# Patient Record
Sex: Female | Born: 1960 | Race: White | Hispanic: No | Marital: Married | Smoking: Never smoker
Health system: Southern US, Community
[De-identification: ages and names within clinical notes are randomized; demographics above are authoritative.]

## PROBLEM LIST (undated history)

## (undated) DIAGNOSIS — K219 Gastro-esophageal reflux disease without esophagitis: Secondary | ICD-10-CM

## (undated) DIAGNOSIS — K209 Esophagitis, unspecified without bleeding: Secondary | ICD-10-CM

## (undated) DIAGNOSIS — F329 Major depressive disorder, single episode, unspecified: Secondary | ICD-10-CM

## (undated) DIAGNOSIS — M069 Rheumatoid arthritis, unspecified: Secondary | ICD-10-CM

## (undated) DIAGNOSIS — Z8489 Family history of other specified conditions: Secondary | ICD-10-CM

## (undated) DIAGNOSIS — D219 Benign neoplasm of connective and other soft tissue, unspecified: Secondary | ICD-10-CM

## (undated) DIAGNOSIS — E039 Hypothyroidism, unspecified: Secondary | ICD-10-CM

## (undated) DIAGNOSIS — R51 Headache: Secondary | ICD-10-CM

## (undated) DIAGNOSIS — R519 Headache, unspecified: Secondary | ICD-10-CM

## (undated) DIAGNOSIS — F32A Depression, unspecified: Secondary | ICD-10-CM

## (undated) HISTORY — DX: Esophagitis, unspecified without bleeding: K20.90

## (undated) HISTORY — DX: Benign neoplasm of connective and other soft tissue, unspecified: D21.9

## (undated) HISTORY — DX: Depression, unspecified: F32.A

## (undated) HISTORY — DX: Headache, unspecified: R51.9

## (undated) HISTORY — DX: Gastro-esophageal reflux disease without esophagitis: K21.9

## (undated) HISTORY — DX: Major depressive disorder, single episode, unspecified: F32.9

## (undated) HISTORY — PX: COLONOSCOPY: SHX174

## (undated) HISTORY — DX: Hypothyroidism, unspecified: E03.9

## (undated) HISTORY — PX: EYE SURGERY: SHX253

## (undated) HISTORY — PX: ESOPHAGOGASTRODUODENOSCOPY: SHX1529

## (undated) HISTORY — DX: Headache: R51

## (undated) HISTORY — DX: Esophagitis, unspecified: K20.9

## (undated) HISTORY — DX: Rheumatoid arthritis, unspecified: M06.9

## (undated) SURGERY — COLONOSCOPY
Anesthesia: Moderate Sedation

---

## 1998-09-29 ENCOUNTER — Ambulatory Visit (HOSPITAL_COMMUNITY): Admission: RE | Admit: 1998-09-29 | Discharge: 1998-09-29 | Payer: Self-pay | Admitting: Ophthalmology

## 1998-09-29 ENCOUNTER — Encounter: Payer: Self-pay | Admitting: Ophthalmology

## 1998-12-29 ENCOUNTER — Ambulatory Visit (HOSPITAL_COMMUNITY): Admission: RE | Admit: 1998-12-29 | Discharge: 1998-12-29 | Payer: Self-pay | Admitting: *Deleted

## 1999-01-26 ENCOUNTER — Ambulatory Visit (HOSPITAL_BASED_OUTPATIENT_CLINIC_OR_DEPARTMENT_OTHER): Admission: RE | Admit: 1999-01-26 | Discharge: 1999-01-26 | Payer: Self-pay | Admitting: Ophthalmology

## 2015-10-27 ENCOUNTER — Ambulatory Visit (INDEPENDENT_AMBULATORY_CARE_PROVIDER_SITE_OTHER): Payer: BC Managed Care – PPO | Admitting: Neurology

## 2015-10-27 ENCOUNTER — Encounter: Payer: Self-pay | Admitting: Neurology

## 2015-10-27 VITALS — BP 130/70 | HR 102 | Ht 62.0 in | Wt 139.0 lb

## 2015-10-27 DIAGNOSIS — G43709 Chronic migraine without aura, not intractable, without status migrainosus: Secondary | ICD-10-CM | POA: Diagnosis not present

## 2015-10-27 MED ORDER — NAPROXEN 500 MG PO TABS: 500.0000 mg | ORAL_TABLET | Freq: Two times a day (BID) | ORAL | Status: DC | PRN

## 2015-10-27 MED ORDER — SUMATRIPTAN SUCCINATE 100 MG PO TABS: ORAL_TABLET | ORAL | Status: DC

## 2015-10-27 MED ORDER — VENLAFAXINE HCL ER 37.5 MG PO TB24: ORAL_TABLET | ORAL | Status: DC

## 2015-10-27 NOTE — Progress Notes (Signed)
Note routed to both. 

## 2015-10-27 NOTE — Progress Notes (Signed)
NEUROLOGY CONSULTATION NOTE  [name] MRN: [id number] DOB: [birth year]  Referring provider: Erline Levine Primary care provider: Ephriam Jenkins  Reason for consult:  headache  HISTORY OF PRESENT ILLNESS: Laurie Cherry is a 54 year old right-handed female with rheumatoid arthritis, hypothyroidism, reflux, and mild depression who presents for headaches.  She has a history of "hormonal" migraines most of her life, described as pounding right temporal pain and often associated with her menstrual cycle.  They have pretty much stabilized but she will still get it every now and then.    She was diagnosed with rheumatoid arthritis in 2014.  She had been on methotrexate for about 2 years (stopped a month ago).  In September 2015, she began having new headaches.  It coincided with change in her thyroid medications.  They are left sided, 7/10 non-throbbing pain.   They are associated with photophobia, phonophobia, and osmophobia but not nausea.  They typically last 3 to 4 days and occur 1 to 2 times a week (however she also has a dull daily headache) Stress seems to be a trigger.  Change in thyroid medication may have been a trigger. These headaches do not correlate with her menstrual cycle.  Current abortive medication:  Sumatriptan 50mg  (helps).  Excedrin and tylenol (ineffective) She currently takes Celexa 10mg  daily, Armour Thyroid, and Vitamin D  Past preventative medication included propranolol ER 80mg  (it was effective but she stopped because it caused bradycardia and hypotension)  Caffeine:  1 cup of coffee daily Alcohol:  no Smoker:  no Diet:  1/2 can of soda daily.  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 Rheumatoid arthritis ([place])   . Depression   .  GERD (gastroesophageal reflux disease)   . Hypothyroidism   . Worsening headaches   . Esophagitis     PAST SURGICAL HISTORY: Past Surgical History  Procedure Laterality Date  . Eye surgery Right     MEDICATIONS: No current outpatient prescriptions on file prior to visit.   No current facility-administered medications on file prior to visit.    ALLERGIES: Allergies  Allergen Reactions  . Arava [Leflunomide] Hives  . Azithromycin Other (See Comments)    Severe bloating, abd pain Severe bloating, abd pain  . Cefuroxime Other (See Comments)    Increased bloating, abd pain  . Cefuroxime Axetil Other (See Comments)    Increased bloating, abd pain  . Chamomile   . Clarithromycin Other (See Comments)    abd pain abd pain  . Etanercept Other (See Comments) and Hives    Injection site irritation Injection site irritation  . Levofloxacin Other (See Comments) and Hives    Severe dizziness Severe dizziness  . Methotrexate Derivatives   . Sulfa Antibiotics Hives    abd pain    FAMILY HISTORY: Family History  Problem Relation Age of Onset  . Thyroid disease Mother   . Heart attack Mother   . Stroke Mother   . Diabetes Mother   . Brain cancer Mother   . Colon cancer Father   . Heart attack Sister   . Hypertension Sister   . Hypertension Brother     SOCIAL HISTORY: Social History   Social History  . Marital Status: Married    Spouse Name: N/A  . Number of Children:  N/A  . Years of Education: N/A   Occupational History  . Not on file.   Social History Main Topics  . Smoking status: Never Smoker   . Smokeless tobacco: Not on file  . Alcohol Use: No  . Drug Use: No  . Sexual Activity: Not on file   Other Topics Concern  . Not on file   Social History Narrative  . No narrative on file    REVIEW OF SYSTEMS: Constitutional: No fevers, chills, or sweats, no generalized fatigue, change in appetite Eyes: No visual changes, double vision, eye pain Ear, nose  and throat: No hearing loss, ear pain, nasal congestion, sore throat Cardiovascular: No chest pain, palpitations Respiratory:  No shortness of breath at rest or with exertion, wheezes GastrointestinaI: No nausea, vomiting, diarrhea, abdominal pain, fecal incontinence Genitourinary:  No dysuria, urinary retention or frequency Musculoskeletal:  No neck pain, back pain Integumentary: No rash, pruritus, skin lesions Neurological: as above Psychiatric: No depression, insomnia, anxiety Endocrine: No palpitations, fatigue, diaphoresis, mood swings, change in appetite, change in weight, increased thirst Hematologic/Lymphatic:  No anemia, purpura, petechiae. Allergic/Immunologic: no itchy/runny eyes, nasal congestion, recent allergic reactions, rashes  PHYSICAL EXAM: Filed Vitals:   10/27/15 0855  BP: 130/70  Pulse: 102   General: No acute distress.  Patient appears well-groomed.   Head:  Normocephalic/atraumatic Eyes:  fundi unremarkable, without vessel changes, exudates, hemorrhages or papilledema. Neck: supple, no paraspinal tenderness, full range of motion Back: No paraspinal tenderness Heart: regular rate and rhythm Lungs: Clear to auscultation bilaterally. Vascular: No carotid bruits. Neurological Exam: Mental status: alert and oriented to person, place, and time, recent and remote memory intact, fund of knowledge intact, attention and concentration intact, speech fluent and not dysarthric, language intact. Cranial nerves: CN I: not tested CN II: pupils equal, round and reactive to light, visual fields intact, fundi unremarkable, without vessel changes, exudates, hemorrhages or papilledema. CN III, IV, VI:  full range of motion, no nystagmus, no ptosis CN V: facial sensation intact CN VII: upper and lower face symmetric CN VIII: hearing intact CN IX, X: gag intact, uvula midline CN XI: sternocleidomastoid and trapezius muscles intact CN XII: tongue midline Bulk & Tone: normal, no  fasciculations. Motor:  5/5 throughout  Sensation: temperature and vibration sensation intact. Deep Tendon Reflexes:  2+ throughout, toes downgoing.  Finger to nose testing:  Without dysmetria.  Heel to shin:  Without dysmetria.  Gait:  Normal station and stride.  Able to turn and tandem walk. Romberg negative.  IMPRESSION: Chronic migraine.  Neurologic exam normal.  PLAN: 1.  Will switch from Celexa to venlafaxine ER, titrating to 75mg  daily (this may address headaches better than Celexa and still treat depression) 2.  For abortive therapy, will try sumatriptan 100mg  with/without naproxen 500mg  3.  Lifestyle modification 4.  She is to call in 4 weeks with update.  Follow up in 3 months.  45 minutes spent face to face with patient, over 50% spent discussing diagnosis and management.  Thank you for allowing me to take part in the care of this patient.  Metta Clines, DO  CC:  Dianne Weston Brass

## 2015-10-27 NOTE — Patient Instructions (Signed)
Migraine Recommendations: 1.  Stop Celexa.  Start venlafaxine ER 37.5mg .  Take 1 pill daily for 7 days, then increase to 2 pills daily.  Call in 4 weeks with update and we can adjust dose if needed. 2.  Take sumatriptan 100mg  at earliest onset of headache.  May repeat dose once in 2 hours if needed.  Do not exceed two doses in 24 hours.  If sumatriptan alone is ineffective, then next time take each dose of sumatriptan with naproxen 500mg  3.  Limit use of pain relievers to no more than 2 days out of the week.  These medications include acetaminophen, ibuprofen, triptans and narcotics.  This will help reduce risk of rebound headaches. 4.  Be aware of common food triggers such as processed sweets, processed foods with nitrites (such as deli meat, hot dogs, sausages), foods with MSG, alcohol (such as wine), chocolate, certain cheeses, certain fruits (dried fruits, some citrus fruit), vinegar, diet soda. 4.  Avoid caffeine 5.  Routine exercise 6.  Proper sleep hygiene 7.  Stay adequately hydrated with water 8.  Keep a headache diary. 9.  Maintain proper stress management. 10.  Do not skip meals. 11.  Consider supplements:  Magnesium oxide 400mg  to 600mg  daily, riboflavin 400mg , Coenzyme Q 10 100mg  three times daily 12.  Follow up in 3 months but CALL IN 4 WEEKS WITH UPDATE

## 2015-11-30 ENCOUNTER — Telehealth: Payer: Self-pay

## 2015-11-30 NOTE — Telephone Encounter (Signed)
She should discontinue venlafaxine and see if symptoms improve.  Instead, we can start topiramate 25mg  at bedtime.  She can pick this up but I would like her to wait until her symptoms resolve before starting it.  She should be aware that topiramate may cause numbness and tingling or problems with concentration, but these are dose-related side effects that often resolve once her body gets use to the dose.  In very rare cases, it may cause kidney stones, so she should stay hydrated with water.  She should call us in 4 weeks after starting the medication.

## 2015-11-30 NOTE — Telephone Encounter (Signed)
Patient called with a report on her medication. Pt started taking the venlafaxine ER ~ 11/04/15. She can tell no difference really in her headache frequency yet. She is also experiencing some side effects including tachycardia (108, 110,118), shaking/hungry sensation that began ~ 4-5 days ago. Please advise.

## 2015-12-01 NOTE — Telephone Encounter (Signed)
Attempted to reach pt. Home number rang and rang, no answering machine picked up.

## 2015-12-01 NOTE — Telephone Encounter (Signed)
Attempted to reach again. Again phone just continuously rang.

## 2015-12-27 ENCOUNTER — Telehealth: Payer: Self-pay | Admitting: Neurology

## 2015-12-27 MED ORDER — TOPIRAMATE 25 MG PO TABS: 25.0000 mg | ORAL_TABLET | Freq: Every day | ORAL | Status: DC

## 2015-12-27 NOTE — Telephone Encounter (Signed)
We can increase venlafaxine ER to 150mg  daily. Certain types of tinted lenses are helpful. I recommend that she see an ophthalmologist for such treatment options and for a formal evaluation of the eyes

## 2015-12-27 NOTE — Telephone Encounter (Signed)
VM-PT called and said her headaches were more frequent and she is feeling worse/Dawn CB# [phone]

## 2015-12-27 NOTE — Telephone Encounter (Signed)
Pt called about her left eye sensitivity. States that even when there isn't  any pain, the sensitivity to light is much worse. Cannot hardly stand any light in that eye, constantly.

## 2015-12-27 NOTE — Telephone Encounter (Signed)
Left message on machine for pt to return call to the office.  

## 2015-12-28 MED ORDER — VENLAFAXINE HCL ER 150 MG PO CP24: 150.0000 mg | ORAL_CAPSULE | Freq: Every day | ORAL | Status: DC

## 2015-12-28 NOTE — Telephone Encounter (Signed)
Message left w/ infomration on voicemail. RX sent in.

## 2015-12-28 NOTE — Telephone Encounter (Signed)
Spoke with patient. She had just discontinued the Effexor in exchange for the topamax as recommended last month. Pt finished out RX before picking up new. Do you think the Effexor would be better suited vs Topamax? PT has only had 1 dose of Topamax at this point. Please advise.

## 2015-12-28 NOTE — Telephone Encounter (Signed)
No, I would continue topamax, but would increase dose to 50mg  at bedtime.  I would give this dose another 4 weeks and have her contact us to determine if further changes need to be made.

## 2015-12-28 NOTE — Addendum Note (Signed)
Addended by: Gerda Diss A on: 12/28/2015 10:38 AM   Modules accepted: Orders, Medications

## 2016-02-06 ENCOUNTER — Ambulatory Visit (INDEPENDENT_AMBULATORY_CARE_PROVIDER_SITE_OTHER): Payer: BC Managed Care – PPO | Admitting: Neurology

## 2016-02-06 ENCOUNTER — Encounter: Payer: Self-pay | Admitting: Neurology

## 2016-02-06 VITALS — BP 150/72 | HR 70 | Ht 62.0 in | Wt 136.8 lb

## 2016-02-06 DIAGNOSIS — G43709 Chronic migraine without aura, not intractable, without status migrainosus: Secondary | ICD-10-CM | POA: Diagnosis not present

## 2016-02-06 DIAGNOSIS — F329 Major depressive disorder, single episode, unspecified: Secondary | ICD-10-CM

## 2016-02-06 DIAGNOSIS — R030 Elevated blood-pressure reading, without diagnosis of hypertension: Secondary | ICD-10-CM | POA: Diagnosis not present

## 2016-02-06 DIAGNOSIS — IMO0001 Reserved for inherently not codable concepts without codable children: Secondary | ICD-10-CM

## 2016-02-06 DIAGNOSIS — F32A Depression, unspecified: Secondary | ICD-10-CM

## 2016-02-06 MED ORDER — ATENOLOL 25 MG PO TABS: ORAL_TABLET | ORAL | Status: DC

## 2016-02-06 NOTE — Progress Notes (Signed)
NEUROLOGY FOLLOW UP OFFICE NOTE  JOMANDA BOWLEY [id number]  HISTORY OF PRESENT ILLNESS: Laurie Cherry is a 55 year old right-handed female with rheumatoid arthritis, hypothyroidism, reflux, and mild depression who follows up for chronic migraine.  UPDATE: Not too much improvement, if at all.  She was migraine-free for about 2 weeks up until 5 days ago, in which she has had a daily migraine. Intensity:  7-8/10 Duration:  Starts improving within 20 to 30 minutes with sumatriptan 50mg  w/wo naproxen Frequency:  15 headache days per month Current NSAIDS:  none Current analgesics:  none Current triptans:  sumatriptan 50mg  (100mg  caused drowsiness) (w/wo naproxen 500mg ) Current anti-emetic:  none Current muscle relaxants:  none Current anti-anxiolytic:  none Current sleep aide:  none Current Antihypertensive medications:  none Current Antidepressant medications:  venlafaxine ER 75mg  Current Anticonvulsant medications:  none  HISTORY: She has a history of "hormonal" migraines most of her life, described as pounding right temporal pain and often associated with her menstrual cycle.  They have pretty much stabilized but she will still get it every now and then.    She was diagnosed with rheumatoid arthritis in 2014.  She had been on methotrexate for about 2 years (stopped a month ago).  In September 2015, she began having new headaches.  It coincided with change in her thyroid medications.  They are left sided, 7/10 non-throbbing pain.    They are associated with photophobia, phonophobia, and osmophobia but not nausea.   They typically last 3 to 4 days and occur 1 to 2 times a week (however she also has a dull daily headache) Stress seems to be a trigger.  Change in thyroid medication may have been a trigger. These headaches do not correlate with her menstrual cycle.  Current abortive medication:  Sumatriptan 50mg  (helps).  Excedrin and tylenol (ineffective) She currently takes Celexa  10mg  daily, Armour Thyroid, and Vitamin D  Past preventative medication included propranolol ER 80mg  (it was effective but she stopped because it caused bradycardia and hypotension), topiramate 25mg  (caused drowsiness, but willing to retry it if needed)  Caffeine:  1 cup of coffee daily Alcohol:  no Smoker:  no Diet:  1/2 can of soda daily.  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 Rheumatoid arthritis ([place])   . Depression   . GERD (gastroesophageal reflux disease)   . Hypothyroidism   . Worsening headaches   . Esophagitis     MEDICATIONS: Current Outpatient Prescriptions on File Prior to Visit  Medication Sig Dispense Refill  . ARMOUR THYROID 60 MG tablet Take 60 mg by mouth daily before breakfast.   11  . naproxen (NAPROSYN) 500 MG tablet Take 1 tablet (500 mg total) by mouth every 12 (twelve) hours as needed. 10 tablet 2  . SUMAtriptan (IMITREX) 100 MG tablet Take 1 tab at earliest onset of headache.  May repeat once in 2 hours if headache persists or recurs.  Do not exceed 2 tablets in 24 hours 10 tablet 2  . venlafaxine XR (EFFEXOR-XR) 150 MG 24 hr capsule Take 1 capsule (150 mg total) by mouth daily. 30 capsule 1  . VENTOLIN HFA 108 (90 Base) MCG/ACT inhaler INHALE 2 PUFFS PO Q 4 H PRN  0  . Vitamin D, Ergocalciferol, (DRISDOL) 50000 units  CAPS capsule Take 50,000 Units by mouth every 7 (seven) days.      No current facility-administered medications on file prior to visit.    ALLERGIES: Allergies  Allergen Reactions  . Arava [Leflunomide] Hives  . Azithromycin Other (See Comments)    Severe bloating, abd pain Severe bloating, abd pain  . Cefuroxime Other (See Comments)    Increased bloating, abd pain  . Cefuroxime Axetil Other (See Comments)     Increased bloating, abd pain  . Chamomile   . Clarithromycin Other (See Comments)    abd pain abd pain  . Etanercept Other (See Comments) and Hives    Injection site irritation Injection site irritation  . Levofloxacin Other (See Comments) and Hives    Severe dizziness Severe dizziness  . Methotrexate Derivatives   . Sulfa Antibiotics Hives    abd pain    FAMILY HISTORY: Family History  Problem Relation Age of Onset  . Thyroid disease Mother   . Heart attack Mother   . Stroke Mother   . Diabetes Mother   . Brain cancer Mother   . Colon cancer Father   . Heart attack Sister   . Hypertension Sister   . Hypertension Brother     SOCIAL HISTORY: Social History   Social History  . Marital Status: Married    Spouse Name: N/A  . Number of Children: N/A  . Years of Education: N/A   Occupational History  . Not on file.   Social History Main Topics  . Smoking status: Never Smoker   . Smokeless tobacco: Not on file  . Alcohol Use: No  . Drug Use: No  . Sexual Activity: Not on file   Other Topics Concern  . Not on file   Social History Narrative    REVIEW OF SYSTEMS: Constitutional: No fevers, chills, or sweats, no generalized fatigue, change in appetite Eyes: No visual changes, double vision, eye pain Ear, nose and throat: No hearing loss, ear pain, nasal congestion, sore throat Cardiovascular: No chest pain, palpitations Respiratory:  No shortness of breath at rest or with exertion, wheezes GastrointestinaI: No nausea, vomiting, diarrhea, abdominal pain, fecal incontinence Genitourinary:  No dysuria, urinary retention or frequency Musculoskeletal:  No neck pain, back pain Integumentary: No rash, pruritus, skin lesions Neurological: as above Psychiatric: No depression, insomnia, anxiety Endocrine: No palpitations, fatigue, diaphoresis, mood swings, change in appetite, change in weight, increased thirst Hematologic/Lymphatic:  No anemia, purpura,  petechiae. Allergic/Immunologic: no itchy/runny eyes, nasal congestion, recent allergic reactions, rashes  PHYSICAL EXAM: Filed Vitals:   02/06/16 1323  BP: 150/72  Pulse: 70   General: No acute distress.  Patient appears well-groomed.  normal body habitus. Head:  Normocephalic/atraumatic Eyes:  Fundoscopic exam unremarkable without vessel changes, exudates, hemorrhages or papilledema. Neck: supple, no paraspinal tenderness, full range of motion Heart:  Regular rate and rhythm Lungs:  Clear to auscultation bilaterally Back: No paraspinal tenderness Neurological Exam: alert and oriented to person, place, and time. Attention span and concentration intact, recent and remote memory intact, fund of knowledge intact.  Speech fluent and not dysarthric, language intact.  CN II-XII intact. Fundoscopic exam unremarkable without vessel changes, exudates, hemorrhages or papilledema.  Bulk and tone normal, muscle strength 5/5 throughout.  Sensation to light touch, temperature and vibration intact.  Deep tendon reflexes 2+ throughout, toes downgoing.  Finger to nose and heel to shin testing intact.  Gait normal, Romberg negative.  IMPRESSION: Chronic migraine Elevated blood pressure depression  PLAN: 1.  Will  start atenolol 25mg  daily for 7 days, then increase to 50mg  daily.  She previously experienced bradycardia and hypotension on propranolol, but atenolol is often better tolerated and her blood pressure has been elevated lately.  Cautioned to monitor for lightheadedness. 2.  Continue sumatriptan with/without naproxen 500mg  3.  Will continue venlafaxine ER 150mg  daily for depression  4.  Follow up with PCP regarding elevated blood pressure 5.  She will contact us in 5 weeks with update.  Follow up in 3 months.  15 minutes spent face to face with patient, over 50% spent discussing management.  Metta Clines, DO  CC:  Ephriam Jenkins

## 2016-02-06 NOTE — Progress Notes (Signed)
Chart forwarded.  

## 2016-02-06 NOTE — Patient Instructions (Addendum)
1.  Start atenolol 25mg  daily for 7 days, then increase to 50mg  daily.  Monitor for lightheadedness.  Contact us in 5 weeks with update. 2.  Continue sumatriptan 100mg  and naproxen as directed 3.  Continue venlafaxine ER 150mg  daily 4.  Your blood pressure is elevated today.  We are starting a blood pressure medication anyway, but follow up with your PCP regarding this. 5.  Follow up in 3 months.

## 2016-02-16 ENCOUNTER — Other Ambulatory Visit: Payer: Self-pay | Admitting: Neurology

## 2016-03-05 ENCOUNTER — Other Ambulatory Visit: Payer: Self-pay | Admitting: Neurology

## 2016-03-06 NOTE — Telephone Encounter (Signed)
Last OV: 02/06/16 Next OV: 05/14/16   Will start atenolol 25mg  daily for 7 days, then increase to 50mg  daily. She previously experienced bradycardia and hypotension on propranolol, but atenolol is often better tolerated and her blood pressure has been elevated lately. Cautioned to monitor for lightheadedness.

## 2016-03-29 ENCOUNTER — Ambulatory Visit (INDEPENDENT_AMBULATORY_CARE_PROVIDER_SITE_OTHER): Payer: BC Managed Care – PPO | Admitting: "Endocrinology

## 2016-03-29 ENCOUNTER — Encounter: Payer: Self-pay | Admitting: "Endocrinology

## 2016-03-29 VITALS — BP 135/86 | HR 69 | Ht 62.0 in | Wt 133.0 lb

## 2016-03-29 DIAGNOSIS — E038 Other specified hypothyroidism: Secondary | ICD-10-CM | POA: Diagnosis not present

## 2016-03-29 DIAGNOSIS — E039 Hypothyroidism, unspecified: Secondary | ICD-10-CM | POA: Insufficient documentation

## 2016-03-29 MED ORDER — LEVOTHYROXINE SODIUM 88 MCG PO TABS: 88.0000 ug | ORAL_TABLET | Freq: Every day | ORAL | Status: DC

## 2016-03-29 NOTE — Progress Notes (Signed)
Subjective:    Patient ID: Laurie Cherry, female    DOB: Jun 17, 1961, PCP Thea Alken   Past Medical History  Diagnosis Date  . Rheumatoid arthritis ([place])   . Depression   . GERD (gastroesophageal reflux disease)   . Hypothyroidism   . Worsening headaches   . Esophagitis    Past Surgical History  Procedure Laterality Date  . Eye surgery Right    Social History   Social History  . Marital Status: Married    Spouse Name: N/A  . Number of Children: N/A  . Years of Education: N/A   Social History Main Topics  . Smoking status: Never Smoker   . Smokeless tobacco: None  . Alcohol Use: No  . Drug Use: No  . Sexual Activity: Not Asked   Other Topics Concern  . None   Social History Narrative   Outpatient Encounter Prescriptions as of 03/29/2016  Medication Sig  . Adalimumab (HUMIRA PEN) 40 MG/0.8ML PNKT Inject into the skin.  . citalopram (CELEXA) 20 MG tablet Take 20 mg by mouth daily.  . methotrexate (RHEUMATREX) 2.5 MG tablet Take 2.5 mg by mouth as directed. Caution:Chemotherapy. Protect from light.  Marland Kitchen atenolol (TENORMIN) 50 MG tablet Take 1 tablet (50 mg total) by mouth daily. (Patient taking differently: Take 25 mg by mouth daily. )  . levothyroxine (SYNTHROID) 88 MCG tablet Take 1 tablet (88 mcg total) by mouth daily before breakfast.  . naproxen (NAPROSYN) 500 MG tablet Take 1 tablet (500 mg total) by mouth every 12 (twelve) hours as needed.  . SUMAtriptan (IMITREX) 100 MG tablet SEE NOTES  . VENTOLIN HFA 108 (90 Base) MCG/ACT inhaler INHALE 2 PUFFS PO Q 4 H PRN  . Vitamin D, Ergocalciferol, (DRISDOL) 50000 units CAPS capsule Take 50,000 Units by mouth every 7 (seven) days.   . [DISCONTINUED] ARMOUR THYROID 60 MG tablet Take 60 mg by mouth daily before breakfast.   . [DISCONTINUED] levothyroxine (SYNTHROID) 88 MCG tablet Take 1 tablet (88 mcg total) by mouth daily before breakfast.  . [DISCONTINUED] venlafaxine XR (EFFEXOR-XR) 150 MG 24 hr capsule Take 1  capsule (150 mg total) by mouth daily.   No facility-administered encounter medications on file as of 03/29/2016.   ALLERGIES: Allergies  Allergen Reactions  . Arava [Leflunomide] Hives  . Azithromycin Other (See Comments)    Severe bloating, abd pain Severe bloating, abd pain  . Cefuroxime Other (See Comments)    Increased bloating, abd pain  . Cefuroxime Axetil Other (See Comments)    Increased bloating, abd pain  . Chamomile   . Clarithromycin Other (See Comments)    abd pain abd pain  . Etanercept Other (See Comments) and Hives    Injection site irritation Injection site irritation  . Levofloxacin Other (See Comments) and Hives    Severe dizziness Severe dizziness  . Methotrexate Derivatives   . Sulfa Antibiotics Hives    abd pain   VACCINATION STATUS:  There is no immunization history on file for this patient.  HPI  55 year old female patient with medical history as above. She is being seen in consultation for long-standing hypothyroidism requested by her primary care provider, Thea Alken FNP. She states that she was diagnosed with hypothyroidism approximately at age 89. She is currently on Armour Thyroid 60 mg daily. Her thyroid function test have never been regulated. Her recent set of labs show suppressed TSH but appropriate free T4 and free T3. -She is compliant with her medications.  She has family  history of thyroid dysfunction in multiple members of her family.  She denies any history of goiter. She denies any family history of thyroid cancer. -Currently denies any heat/cold intolerance.  -She is on multiple medications for her rheumatoid arthritis. In recent months she has steady body weight.  Review of Systems  Constitutional: no weight gain/loss, + fatigue, no subjective hyperthermia/hypothermia Eyes: no blurry vision, no xerophthalmia ENT: no sore throat, no nodules palpated in throat, no dysphagia/odynophagia, no hoarseness Cardiovascular: no  CP/SOB/palpitations/leg swelling Respiratory: no cough/SOB Gastrointestinal: no N/V/D/C Musculoskeletal: no muscle/joint aches Skin: no rashes Neurological: no tremors/numbness/tingling/dizziness Psychiatric: no depression/anxiety   Objective:    BP 135/86 mmHg  Pulse 69  Ht 5\' 2"  (1.575 m)  Wt 133 lb (60.328 kg)  BMI 24.32 kg/m2  SpO2 97%  Wt Readings from Last 3 Encounters:  03/29/16 133 lb (60.328 kg)  02/06/16 136 lb 12.8 oz (62.052 kg)  10/27/15 139 lb (63.05 kg)    Physical Exam  Constitutional:  in NAD Eyes: PERRLA, EOMI, no exophthalmos ENT: moist mucous membranes, no thyromegaly, no cervical lymphadenopathy Cardiovascular: RRR, No MRG Respiratory: CTA B Gastrointestinal: abdomen soft, NT, ND, BS+ Musculoskeletal: no deformities, strength intact in all 4 Skin: moist, warm, no rashes Neurological: no tremor with outstretched hands, DTR normal in all 4  On 02/21/2016: Free T4 2 0.9, TSH 0.13, free T4 0.86  Assessment & Plan:   1.  hypothyroidism -I have reviewed her available thyroid records and clinically evaluated this patient. Based on her history, it is likely that she has Hashimoto's thyroiditis as a cause of hypothyroidism. -She is clinically stable despite her recent abnormal thyroid function tests. This is mainly driven by the desiccated thyroid (Armour) she is taking. -She is open to consider Synthroid which would give her a chance to regulate thyroid function test better. If she uses Synthroid at the right dose, she should expect appropriate clinical response. I will initiate Synthroid 88 g by mouth every morning. I advised her to stop Armour Thyroid.  - We discussed about correct intake of levothyroxine, at fasting, with water, separated by at least 30 minutes from breakfast, and separated by more than 4 hours from calcium, iron, multivitamins, acid reflux medications (PPIs). -Patient is made aware of the fact that thyroid hormone replacement is needed  for life, dose to be adjusted by periodic monitoring of thyroid function tests. -Her next thyroid function tests within 2 weeks with office visit in 3 weeks. -She has no clinical goiter, hence, no need for thyroid/neck ultrasound.   - I advised patient to maintain close follow up with Ephriam Jenkins E for primary care needs. Follow up plan: Return in about 3 weeks (around 04/19/2016) for follow up with pre-visit labs, underactive thyroid.  Glade Lloyd, MD Phone: [phone]  Fax: [phone]   03/29/2016, 3:39 PM

## 2016-04-12 ENCOUNTER — Telehealth: Payer: Self-pay

## 2016-04-12 NOTE — Telephone Encounter (Signed)
Pt states that the Synthroid she is taking is causing "bloating and stuffy nose". She is wanting to change this medication.

## 2016-04-15 NOTE — Telephone Encounter (Signed)
Wondering if she has done the follow up labs ? This will be helpful how to dose her next replacement.

## 2016-04-15 NOTE — Telephone Encounter (Signed)
Called pt. No answer. No voice mail. Pt needs to have bloodwork done if she has not already.

## 2016-04-17 NOTE — Telephone Encounter (Signed)
Called pt. No Answer. No Voice mail. Awaiting return call from pt.

## 2016-04-22 ENCOUNTER — Ambulatory Visit: Payer: BC Managed Care – PPO | Admitting: "Endocrinology

## 2016-04-22 ENCOUNTER — Telehealth: Payer: Self-pay

## 2016-04-22 NOTE — Telephone Encounter (Signed)
Pt states that she has stopped the synthroid and started back on her Armour thyroid. Dr Dorris Fetch states for her to stay on the [place] for 3 weeks and then have labs at 4 weeks.  Pt notified.

## 2016-04-29 ENCOUNTER — Ambulatory Visit: Payer: BC Managed Care – PPO | Admitting: "Endocrinology

## 2016-05-09 ENCOUNTER — Ambulatory Visit: Payer: BC Managed Care – PPO | Admitting: Neurology

## 2016-05-09 ENCOUNTER — Other Ambulatory Visit: Payer: Self-pay

## 2016-05-13 ENCOUNTER — Encounter: Payer: Self-pay | Admitting: Neurology

## 2016-05-13 ENCOUNTER — Ambulatory Visit (INDEPENDENT_AMBULATORY_CARE_PROVIDER_SITE_OTHER): Payer: BC Managed Care – PPO | Admitting: Neurology

## 2016-05-13 VITALS — BP 134/80 | HR 83 | Ht 62.0 in | Wt 137.0 lb

## 2016-05-13 DIAGNOSIS — G43709 Chronic migraine without aura, not intractable, without status migrainosus: Secondary | ICD-10-CM | POA: Diagnosis not present

## 2016-05-13 NOTE — Patient Instructions (Signed)
Migraine Recommendations: 1.  Continue atenolol 25mg  daily 2.  Take sumatriptan at earliest onset of headache.  May repeat dose once in 2 hours if needed.  Do not exceed two tablets in 24 hours. 3.  Limit use of pain relievers to no more than 2 days out of the week.  These medications include acetaminophen, ibuprofen, triptans and narcotics.  This will help reduce risk of rebound headaches. 4.  Be aware of common food triggers such as processed sweets, processed foods with nitrites (such as deli meat, hot dogs, sausages), foods with MSG, alcohol (such as wine), chocolate, certain cheeses, certain fruits (dried fruits, some citrus fruit), vinegar, diet soda. 4.  Avoid caffeine 5.  Routine exercise 6.  Proper sleep hygiene 7.  Stay adequately hydrated with water 8.  Keep a headache diary. 9.  Maintain proper stress management. 10.  Do not skip meals. 11.  Consider supplements:  Magnesium oxide 400mg  to 600mg  daily, riboflavin 400mg , Coenzyme Q 10 100mg  three times daily 12.  Follow up in 3 months.

## 2016-05-13 NOTE — Progress Notes (Signed)
NEUROLOGY FOLLOW UP OFFICE NOTE  ELISSA LEFEVERS [id number]  HISTORY OF PRESENT ILLNESS: Brenasia Mickle is a 55 year old right-handed female with rheumatoid arthritis, hypothyroidism, reflux, and mild depression who follows up for chronic migraine.  UPDATE: Improved.  She has retired and under less stress.  She still does not exercise or drink enough water.  Intensity is improved and frequency mildly reduced. Intensity:  5-6/10 Duration:  Starts improving within 20 to 30 minutes with sumatriptan 50mg  w/wo naproxen Frequency:  15 headache days per month Current NSAIDS:  none Current analgesics:  none Current triptans:  sumatriptan 50mg  (100mg  caused drowsiness) (w/wo naproxen 500mg ) Current anti-emetic:  none Current muscle relaxants:  none Current anti-anxiolytic:  none Current sleep aide:  none Current Antihypertensive medications:  atenolol 25mg  (50mg  caused dizziness and hypotension/bradycardia) Current Antidepressant medications:  Celexa Current Anticonvulsant medications:  none  HISTORY: She has a history of "hormonal" migraines most of her life, described as pounding right temporal pain and often associated with her menstrual cycle.  They have pretty much stabilized but she will still get it every now and then.    She was diagnosed with rheumatoid arthritis in 2014.  She had been on methotrexate for about 2 years (stopped a month ago).  In September 2015, she began having new headaches.  It coincided with change in her thyroid medications.  They are left sided, 7/10 non-throbbing pain.    They are associated with photophobia, phonophobia, and osmophobia but not nausea.   They typically last 3 to 4 days and occur 1 to 2 times a week (however she also has a dull daily headache) Stress seems to be a trigger.  Change in thyroid medication may have been a trigger. These headaches do not correlate with her menstrual cycle.  Past abortive medication: Excedrin and tylenol  (ineffective)  Past preventative medication included propranolol ER 80mg  (it was effective but she stopped because it caused bradycardia and hypotension), topiramate 25mg  (caused drowsiness, but willing to retry it if needed), venlafaxine XR 150mg   Caffeine:  1 cup of coffee daily Alcohol:  no Smoker:  no Diet:  1/2 can of soda daily.  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 Rheumatoid arthritis ([place])   . Depression   . GERD (gastroesophageal reflux disease)   . Hypothyroidism   . Worsening headaches   . Esophagitis     MEDICATIONS: Current Outpatient Prescriptions on File Prior to Visit  Medication Sig Dispense Refill  . Adalimumab (HUMIRA PEN) 40 MG/0.8ML PNKT Inject into the skin.    Francia Greaves THYROID 60 MG tablet   11  . atenolol (TENORMIN) 50 MG tablet Take 1 tablet (50 mg total) by mouth daily. (Patient taking differently: Take 25 mg by mouth daily. ) 30 tablet 2  . citalopram (CELEXA) 20 MG tablet Take 20 mg by mouth daily.    . folic acid (FOLVITE) 1 MG tablet TK 1 T PO QD  2  . methotrexate (RHEUMATREX) 2.5 MG tablet Take 2.5 mg by mouth as directed. Caution:Chemotherapy. Protect from light.    . naproxen (NAPROSYN) 500 MG tablet Take 1 tablet (500 mg total) by mouth every 12 (twelve) hours as needed. 10 tablet 2  . SUMAtriptan (IMITREX) 100 MG tablet SEE NOTES 10 tablet 4  .  VENTOLIN HFA 108 (90 Base) MCG/ACT inhaler INHALE 2 PUFFS PO Q 4 H PRN  0  . Vitamin D, Ergocalciferol, (DRISDOL) 50000 units CAPS capsule Take 50,000 Units by mouth every 7 (seven) days.      No current facility-administered medications on file prior to visit.    ALLERGIES: Allergies  Allergen Reactions  . Arava [Leflunomide] Hives  . Azithromycin Other (See Comments)    Severe  bloating, abd pain Severe bloating, abd pain  . Cefuroxime Other (See Comments)    Increased bloating, abd pain  . Cefuroxime Axetil Other (See Comments)    Increased bloating, abd pain  . Chamomile   . Clarithromycin Other (See Comments)    abd pain abd pain  . Etanercept Other (See Comments) and Hives    Injection site irritation Injection site irritation  . Levofloxacin Other (See Comments) and Hives    Severe dizziness Severe dizziness  . Methotrexate Derivatives   . Sulfa Antibiotics Hives    abd pain    FAMILY HISTORY: Family History  Problem Relation Age of Onset  . Thyroid disease Mother   . Heart attack Mother   . Stroke Mother   . Diabetes Mother   . Brain cancer Mother   . Colon cancer Father   . Heart attack Sister   . Hypertension Sister   . Hypertension Brother     SOCIAL HISTORY: Social History   Social History  . Marital Status: Married    Spouse Name: N/A  . Number of Children: N/A  . Years of Education: N/A   Occupational History  . Not on file.   Social History Main Topics  . Smoking status: Never Smoker   . Smokeless tobacco: Not on file  . Alcohol Use: No  . Drug Use: No  . Sexual Activity: Not on file   Other Topics Concern  . Not on file   Social History Narrative    REVIEW OF SYSTEMS: Constitutional: No fevers, chills, or sweats, no generalized fatigue, change in appetite Eyes: No visual changes, double vision, eye pain Ear, nose and throat: No hearing loss, ear pain, nasal congestion, sore throat Cardiovascular: No chest pain, palpitations Respiratory:  No shortness of breath at rest or with exertion, wheezes GastrointestinaI: No nausea, vomiting, diarrhea, abdominal pain, fecal incontinence Genitourinary:  No dysuria, urinary retention or frequency Musculoskeletal:  No neck pain, back pain Integumentary: No rash, pruritus, skin lesions Neurological: as above Psychiatric: No depression, insomnia, anxiety Endocrine: No  palpitations, fatigue, diaphoresis, mood swings, change in appetite, change in weight, increased thirst Hematologic/Lymphatic:  No purpura, petechiae. Allergic/Immunologic: no itchy/runny eyes, nasal congestion, recent allergic reactions, rashes  PHYSICAL EXAM: Filed Vitals:   05/13/16 0922  BP: 134/80  Pulse: 83   General: No acute distress.  Patient appears well-groomed.  normal body habitus. Head:  Normocephalic/atraumatic Eyes:  Fundi examined but not visualized Neck: supple, no paraspinal tenderness, full range of motion Heart:  Regular rate and rhythm Lungs:  Clear to auscultation bilaterally Back: No paraspinal tenderness Neurological Exam: alert and oriented to person, place, and time. Attention span and concentration intact, recent and remote memory intact, fund of knowledge intact.  Speech fluent and not dysarthric, language intact.  CN II-XII intact. Bulk and tone normal, muscle strength 5/5 throughout.  Sensation to light touch, temperature and vibration intact.  Deep tendon reflexes 2+ throughout, toes downgoing.  Finger to nose and heel to shin testing intact.  Gait normal, Romberg negative.  IMPRESSION: Chronic migraine  PLAN: 1.  Continue atenolol 25mg  daily and sumatriptan as needed. 2.  Will work on lifestyle modification as discussed. 3.  Follow up in 3 months.  If frequency not improved, consider Botox  15 minutes spent face to face with patient, over 50% spent discussing management.  Metta Clines, DO  CC:  Ephriam Jenkins

## 2016-05-13 NOTE — Progress Notes (Signed)
Chart forwarded to PCP.  

## 2016-05-21 ENCOUNTER — Ambulatory Visit (INDEPENDENT_AMBULATORY_CARE_PROVIDER_SITE_OTHER): Payer: BC Managed Care – PPO | Admitting: "Endocrinology

## 2016-05-21 ENCOUNTER — Encounter: Payer: Self-pay | Admitting: "Endocrinology

## 2016-05-21 VITALS — BP 158/90 | HR 76 | Resp 18 | Ht 62.0 in | Wt 137.0 lb

## 2016-05-21 DIAGNOSIS — E038 Other specified hypothyroidism: Secondary | ICD-10-CM | POA: Diagnosis not present

## 2016-05-21 MED ORDER — LEVOTHYROXINE SODIUM 88 MCG PO CAPS: 88.0000 ug | ORAL_CAPSULE | Freq: Every day | ORAL | 4 refills | Status: DC

## 2016-05-21 NOTE — Progress Notes (Signed)
Subjective:    Patient ID: Laurie Cherry, female    DOB: September 02, 1961, PCP Thea Alken   Past Medical History:  Diagnosis Date  . Depression   . Esophagitis   . GERD (gastroesophageal reflux disease)   . Hypothyroidism   . Rheumatoid arthritis ([place])   . Worsening headaches    Past Surgical History:  Procedure Laterality Date  . EYE SURGERY Right    Social History   Social History  . Marital status: Married    Spouse name: N/A  . Number of children: N/A  . Years of education: N/A   Social History Main Topics  . Smoking status: Never Smoker  . Smokeless tobacco: None  . Alcohol use No  . Drug use: No  . Sexual activity: Not Asked   Other Topics Concern  . None   Social History Narrative  . None   Outpatient Encounter Prescriptions as of 05/21/2016  Medication Sig  . Adalimumab (HUMIRA PEN) 40 MG/0.8ML PNKT Inject into the skin.  Marland Kitchen atenolol (TENORMIN) 50 MG tablet Take 1 tablet (50 mg total) by mouth daily. (Patient taking differently: Take 25 mg by mouth daily. )  . citalopram (CELEXA) 20 MG tablet Take 20 mg by mouth daily.  . folic acid (FOLVITE) 1 MG tablet TK 1 T PO QD  . methotrexate (RHEUMATREX) 2.5 MG tablet Take 2.5 mg by mouth as directed. Caution:Chemotherapy. Protect from light.  . naproxen (NAPROSYN) 500 MG tablet Take 1 tablet (500 mg total) by mouth every 12 (twelve) hours as needed.  . SUMAtriptan (IMITREX) 100 MG tablet SEE NOTES  . VENTOLIN HFA 108 (90 Base) MCG/ACT inhaler INHALE 2 PUFFS PO Q 4 H PRN  . Vitamin D, Ergocalciferol, (DRISDOL) 50000 units CAPS capsule Take 50,000 Units by mouth every 7 (seven) days.   . [DISCONTINUED] ARMOUR THYROID 60 MG tablet   . Levothyroxine Sodium (TIROSINT) 88 MCG CAPS Take 1 capsule (88 mcg total) by mouth daily before breakfast.   No facility-administered encounter medications on file as of 05/21/2016.    ALLERGIES: Allergies  Allergen Reactions  . Arava [Leflunomide] Hives  . Azithromycin  Other (See Comments)    Severe bloating, abd pain Severe bloating, abd pain  . Cefuroxime Other (See Comments)    Increased bloating, abd pain  . Cefuroxime Axetil Other (See Comments)    Increased bloating, abd pain  . Chamomile   . Clarithromycin Other (See Comments)    abd pain abd pain  . Etanercept Other (See Comments) and Hives    Injection site irritation Injection site irritation  . Levofloxacin Other (See Comments) and Hives    Severe dizziness Severe dizziness  . Methotrexate Derivatives   . Sulfa Antibiotics Hives    abd pain   VACCINATION STATUS:  There is no immunization history on file for this patient.  HPI  55 year old female patient with medical history as above. She is being seen in f/u for long-standing hypothyroidism requested by her primary care provider, Thea Alken FNP.    She states that she was diagnosed with hypothyroidism approximately at age 56.During her last visit she was switched to Synthroid 88 g by mouth every morning.  After taking it for 2 weeks she started to have bloating, skin redness on her belly and she decided to stop it. She resumed to take her Armour thyroid 60 mg daily.  - She states that she does not feel well on her current Armour either. She complains of  cold intolerance, weight gain, bloating, and skin rash.   Her recent set of labs show elevated TSH of 7.58 along with suppressed free T4 of 0.63. -She is compliant to her medications  She has family  history of thyroid dysfunction in multiple members of her family.  She denies any history of goiter. She denies any family history of thyroid cancer.  -She is on multiple medications for her rheumatoid arthritis.   Review of Systems  Constitutional: + weight gain, + fatigue,  +subjective hypothermia Eyes: no blurry vision, no xerophthalmia ENT: no sore throat, no nodules palpated in throat, no dysphagia/odynophagia, no hoarseness Cardiovascular: no CP/SOB/palpitations/leg  swelling Respiratory: no cough/SOB Gastrointestinal: no N/V/D/C Musculoskeletal: no muscle/joint aches Skin: no rashes Neurological: no tremors/numbness/tingling/dizziness Psychiatric: no depression/anxiety   Objective:    BP (!) 158/90 (BP Location: Left Arm, Patient Position: Sitting, Cuff Size: Small)   Pulse 76   Resp 18   Ht 5\' 2"  (1.575 m)   Wt 137 lb (62.1 kg)   SpO2 99%   BMI 25.06 kg/m   Wt Readings from Last 3 Encounters:  05/21/16 137 lb (62.1 kg)  05/13/16 137 lb (62.1 kg)  03/29/16 133 lb (60.3 kg)    Physical Exam  Constitutional:  in NAD Eyes: PERRLA, EOMI, no exophthalmos ENT: moist mucous membranes, no thyromegaly, no cervical lymphadenopathy Cardiovascular: RRR, No MRG Respiratory: CTA B Gastrointestinal: abdomen soft, NT, ND, BS+ Musculoskeletal: no deformities, strength intact in all 4 Skin: moist, warm, no rashes Neurological: no tremor with outstretched hands, DTR normal in all 4  - On 05/09/2016: Free T4 0.63, TSH 7.58, TPO antibodies 13  Assessment & Plan:   1.  hypothyroidism - She has not responded to Synthroid nor levothyroxine. Based on her history, it is likely that she has Hashimoto's thyroiditis as a cause of hypothyroidism. -She is not doing too well on dislocated thyroid (Armour). -I have discussed her next options for thyroid hormone replacement. The next logical consideration would be initiation of therapy with Tirosint 88 g by mouth every morning. - I gave her a coupon and prescription.   - We discussed about correct intake of levothyroxine, at fasting, with water, separated by at least 30 minutes from breakfast, and separated by more than 4 hours from calcium, iron, multivitamins, acid reflux medications (PPIs). -Patient is made aware of the fact that thyroid hormone replacement is needed for life, dose to be adjusted by periodic monitoring of thyroid function tests.  -She has no clinical goiter, hence, no need for thyroid/neck  ultrasound.   - I advised patient to maintain close follow up with Ephriam Jenkins E for primary care needs. Follow up plan: Return in about 3 months (around 08/21/2016) for follow up with pre-visit labs.  Glade Lloyd, MD Phone: [phone]  Fax: [phone]   05/21/2016, 11:36 AM

## 2016-06-03 ENCOUNTER — Encounter: Payer: Self-pay | Admitting: "Endocrinology

## 2016-08-15 ENCOUNTER — Ambulatory Visit (INDEPENDENT_AMBULATORY_CARE_PROVIDER_SITE_OTHER): Payer: BC Managed Care – PPO | Admitting: Neurology

## 2016-08-15 ENCOUNTER — Encounter: Payer: Self-pay | Admitting: Neurology

## 2016-08-15 VITALS — BP 144/82 | HR 86 | Ht 62.0 in | Wt 143.2 lb

## 2016-08-15 DIAGNOSIS — G43009 Migraine without aura, not intractable, without status migrainosus: Secondary | ICD-10-CM | POA: Diagnosis not present

## 2016-08-15 MED ORDER — ATENOLOL 25 MG PO TABS: 25.0000 mg | ORAL_TABLET | Freq: Every day | ORAL | 2 refills | Status: DC

## 2016-08-15 NOTE — Progress Notes (Signed)
NEUROLOGY FOLLOW UP OFFICE NOTE  AVAYA GRABOSKI [id number]  HISTORY OF PRESENT ILLNESS: Laurie Cherry is a 55 year old right-handed female with rheumatoid arthritis, hypothyroidism, reflux, and mild depression who follows up for chronic migraine.   UPDATE: Still has daily mild nagging headache.  As for the migraines: Intensity:  5-6; July: 5-6/10 Duration:  20-30 minutes; July: Starts improving within 20 to 30 minutes with sumatriptan 50mg  w/wo naproxen Frequency:  5 migraine days in past month; July: 15 headache days per month Current NSAIDS:  Advil (for arthritis), naproxen (infrequent) Current analgesics:  none Current triptans:  sumatriptan 50mg  (100mg  caused drowsiness) (w/wo naproxen 500mg ) Current anti-emetic:  none Current muscle relaxants:  none Current anti-anxiolytic:  none Current sleep aide:  none Current Antihypertensive medications:  atenolol 25mg  (50mg  caused dizziness and hypotension/bradycardia) Current Antidepressant medications:  Celexa Current Anticonvulsant medications:  none  She takes atenolol at night.  She falls asleep easily but wakes up and cannot fall back asleep.   HISTORY: She has a history of "hormonal" migraines most of her life, described as pounding right temporal pain and often associated with her menstrual cycle.  They have pretty much stabilized but she will still get it every now and then.     She was diagnosed with rheumatoid arthritis in 2014.  She had been on methotrexate for about 2 years (stopped a month ago).  In September 2015, she began having new headaches.  It coincided with change in her thyroid medications.  They are left sided, 7/10 non-throbbing pain.    They are associated with photophobia, phonophobia, and osmophobia but not nausea.   They typically last 3 to 4 days and occur 1 to 2 times a week (however she also has a dull daily headache) Stress seems to be a trigger.  Change in thyroid medication may have been a trigger.  These headaches do not correlate with her menstrual cycle.   Past abortive medication: Excedrin and tylenol (ineffective)   Past preventative medication included propranolol ER 80mg  (it was effective but she stopped because it caused bradycardia and hypotension), topiramate 25mg  (caused drowsiness, but willing to retry it if needed), venlafaxine XR 150mg    Caffeine:  1 cup of coffee daily Alcohol:  no Smoker:  no Diet:  1/2 can of soda daily.  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 Depression   . Esophagitis   . GERD (gastroesophageal reflux disease)   . Hypothyroidism   . Rheumatoid arthritis ([place])   . Worsening headaches     MEDICATIONS: Current Outpatient Prescriptions on File Prior to Visit  Medication Sig Dispense Refill  . Adalimumab (HUMIRA PEN) 40 MG/0.8ML PNKT Inject into the skin.    . citalopram (CELEXA) 20 MG tablet Take 20 mg by mouth daily.    . folic acid (FOLVITE) 1 MG tablet TK 1 T PO QD  2  . Levothyroxine Sodium (TIROSINT) 88 MCG CAPS Take 1 capsule (88 mcg total) by mouth daily before breakfast. 30 capsule 4  . methotrexate (RHEUMATREX) 2.5 MG tablet Take 2.5 mg by mouth as directed. Caution:Chemotherapy. Protect from light.    . naproxen (NAPROSYN) 500 MG tablet Take 1 tablet (500 mg total) by mouth every 12 (twelve) hours as needed. 10 tablet 2  . SUMAtriptan (IMITREX) 100  MG tablet SEE NOTES 10 tablet 4  . VENTOLIN HFA 108 (90 Base) MCG/ACT inhaler INHALE 2 PUFFS PO Q 4 H PRN  0  . Vitamin D, Ergocalciferol, (DRISDOL) 50000 units CAPS capsule Take 50,000 Units by mouth every 7 (seven) days.      No current facility-administered medications on file prior to visit.     ALLERGIES: Allergies  Allergen Reactions  . Arava [Leflunomide]  Hives  . Azithromycin Other (See Comments)    Severe bloating, abd pain Severe bloating, abd pain  . Cefuroxime Other (See Comments)    Increased bloating, abd pain  . Cefuroxime Axetil Other (See Comments)    Increased bloating, abd pain  . Chamomile   . Clarithromycin Other (See Comments)    abd pain abd pain  . Etanercept Other (See Comments) and Hives    Injection site irritation Injection site irritation  . Levofloxacin Other (See Comments) and Hives    Severe dizziness Severe dizziness  . Methotrexate Derivatives   . Sulfa Antibiotics Hives    abd pain    FAMILY HISTORY: Family History  Problem Relation Age of Onset  . Thyroid disease Mother   . Heart attack Mother   . Stroke Mother   . Diabetes Mother   . Brain cancer Mother   . Colon cancer Father   . Heart attack Sister   . Hypertension Sister   . Hypertension Brother     SOCIAL HISTORY: Social History   Social History  . Marital status: Married    Spouse name: N/A  . Number of children: N/A  . Years of education: N/A   Occupational History  . Not on file.   Social History Main Topics  . Smoking status: Never Smoker  . Smokeless tobacco: Not on file  . Alcohol use No  . Drug use: No  . Sexual activity: Not on file   Other Topics Concern  . Not on file   Social History Narrative  . No narrative on file    REVIEW OF SYSTEMS: Constitutional: No fevers, chills, or sweats, no generalized fatigue, change in appetite Eyes: No visual changes, double vision, eye pain Ear, nose and throat: No hearing loss, ear pain, nasal congestion, sore throat Cardiovascular: No chest pain, palpitations Respiratory:  No shortness of breath at rest or with exertion, wheezes GastrointestinaI: No nausea, vomiting, diarrhea, abdominal pain, fecal incontinence Genitourinary:  No dysuria, urinary retention or frequency Musculoskeletal:  No neck pain, back pain Integumentary: No rash, pruritus, skin  lesions Neurological: as above Psychiatric: No depression, insomnia, anxiety Endocrine: No palpitations, fatigue, diaphoresis, mood swings, change in appetite, change in weight, increased thirst Hematologic/Lymphatic:  No purpura, petechiae. Allergic/Immunologic: no itchy/runny eyes, nasal congestion, recent allergic reactions, rashes  PHYSICAL EXAM: Vitals:   08/15/16 1127  BP: (!) 144/82  Pulse: 86   General: No acute distress.  Patient appears well-groomed.   Head:  Normocephalic/atraumatic  IMPRESSION: Migraine  PLAN: 1.  Atenolol 25mg  every morning (inttead of at night), which may help with sleep.   2.  Sumatriptan with or without naproxen 500mg  as needed 3.  Follow up in 3 months.  15 minutes spent face to face with patient, 100% spent counseling.  Metta Clines, DO  CC:  Ephriam Jenkins

## 2016-08-15 NOTE — Patient Instructions (Signed)
Take atenolol 25mg  every morning Use sumatriptan as needed Follow up in 3 months.

## 2016-08-20 ENCOUNTER — Ambulatory Visit (INDEPENDENT_AMBULATORY_CARE_PROVIDER_SITE_OTHER): Payer: BC Managed Care – PPO | Admitting: "Endocrinology

## 2016-08-20 ENCOUNTER — Encounter: Payer: Self-pay | Admitting: "Endocrinology

## 2016-08-20 VITALS — BP 138/79 | HR 65 | Ht 62.0 in | Wt 143.0 lb

## 2016-08-20 DIAGNOSIS — E038 Other specified hypothyroidism: Secondary | ICD-10-CM | POA: Diagnosis not present

## 2016-08-20 MED ORDER — TIROSINT 100 MCG PO CAPS: 100.0000 ug | ORAL_CAPSULE | Freq: Every day | ORAL | 1 refills | Status: DC

## 2016-08-20 NOTE — Progress Notes (Signed)
Subjective:    Patient ID: Laurie Cherry, female    DOB: 1961/10/02, PCP Thea Alken   Past Medical History:  Diagnosis Date  . Depression   . Esophagitis   . GERD (gastroesophageal reflux disease)   . Hypothyroidism   . Rheumatoid arthritis ([place])   . Worsening headaches    Past Surgical History:  Procedure Laterality Date  . EYE SURGERY Right    Social History   Social History  . Marital status: Married    Spouse name: N/A  . Number of children: N/A  . Years of education: N/A   Social History Main Topics  . Smoking status: Never Smoker  . Smokeless tobacco: Never Used  . Alcohol use No  . Drug use: No  . Sexual activity: Not Asked   Other Topics Concern  . None   Social History Narrative  . None   Outpatient Encounter Prescriptions as of 08/20/2016  Medication Sig  . Adalimumab (HUMIRA PEN) 40 MG/0.8ML PNKT Inject into the skin.  Marland Kitchen atenolol (TENORMIN) 25 MG tablet Take 1 tablet (25 mg total) by mouth daily.  . citalopram (CELEXA) 20 MG tablet Take 20 mg by mouth daily.  . folic acid (FOLVITE) 1 MG tablet TK 1 T PO QD  . methotrexate (RHEUMATREX) 2.5 MG tablet Take 2.5 mg by mouth as directed. Caution:Chemotherapy. Protect from light.  . naproxen (NAPROSYN) 500 MG tablet Take 1 tablet (500 mg total) by mouth every 12 (twelve) hours as needed.  . SUMAtriptan (IMITREX) 100 MG tablet SEE NOTES  . TIROSINT 100 MCG CAPS Take 1 capsule (100 mcg total) by mouth daily before breakfast.  . VENTOLIN HFA 108 (90 Base) MCG/ACT inhaler INHALE 2 PUFFS PO Q 4 H PRN  . Vitamin D, Ergocalciferol, (DRISDOL) 50000 units CAPS capsule Take 50,000 Units by mouth every 7 (seven) days.   . [DISCONTINUED] Levothyroxine Sodium (TIROSINT) 88 MCG CAPS Take 1 capsule (88 mcg total) by mouth daily before breakfast.   No facility-administered encounter medications on file as of 08/20/2016.    ALLERGIES: Allergies  Allergen Reactions  . Arava [Leflunomide] Hives  .  Azithromycin Other (See Comments)    Severe bloating, abd pain Severe bloating, abd pain  . Cefuroxime Other (See Comments)    Increased bloating, abd pain  . Cefuroxime Axetil Other (See Comments)    Increased bloating, abd pain  . Chamomile   . Clarithromycin Other (See Comments)    abd pain abd pain  . Etanercept Other (See Comments) and Hives    Injection site irritation Injection site irritation  . Levofloxacin Other (See Comments) and Hives    Severe dizziness Severe dizziness  . Methotrexate Derivatives   . Sulfa Antibiotics Hives    abd pain   VACCINATION STATUS:  There is no immunization history on file for this patient.  HPI  55 year old female patient with medical history as above. She is being seen in f/u for long-standing hypothyroidism requested by her primary care provider, Thea Alken FNP.   She states that she was diagnosed with hypothyroidism approximately at age 65. Currently onTirosint 88 g by mouth every morning. This is due to her intolerance to regular levothyroxine, Synthroid, and not doing too well on [place] Thyroid.  He denies new complaints today.  He came with a lab showing better free T4 at 1.36. -She is compliant to her medications  She has family  history of thyroid dysfunction in multiple members of her family.  She denies any history of goiter. She denies any family history of thyroid cancer.  -She is on multiple medications for her rheumatoid arthritis.   Review of Systems  Constitutional: + weight gain, + fatigue,  +subjective hypothermia Eyes: no blurry vision, no xerophthalmia ENT: no sore throat, no nodules palpated in throat, no dysphagia/odynophagia, no hoarseness Cardiovascular: no CP/SOB/palpitations/leg swelling Respiratory: no cough/SOB Gastrointestinal: no N/V/D/C Musculoskeletal: no muscle/joint aches Skin: no rashes Neurological: no tremors/numbness/tingling/dizziness Psychiatric: no  depression/anxiety   Objective:    BP 138/79   Pulse 65   Ht 5\' 2"  (1.575 m)   Wt 143 lb (64.9 kg)   BMI 26.16 kg/m   Wt Readings from Last 3 Encounters:  08/20/16 143 lb (64.9 kg)  08/15/16 143 lb 4 oz (65 kg)  05/21/16 137 lb (62.1 kg)    Physical Exam  Constitutional:  in NAD Eyes: PERRLA, EOMI, no exophthalmos ENT: moist mucous membranes, no thyromegaly, no cervical lymphadenopathy Cardiovascular: RRR, No MRG Respiratory: CTA B Gastrointestinal: abdomen soft, NT, ND, BS+ Musculoskeletal: no deformities, strength intact in all 4 Skin: moist, warm, no rashes Neurological: no tremor with outstretched hands, DTR normal in all 4  - On 08/16/2016 her free T4 was 1.36, improving from prior studies. - On 05/09/2016: Free T4 0.63, TSH 7.58, TPO antibodies 13  Assessment & Plan:   1.  hypothyroidism - Based on her recent thyroid function test, she will benefit from a slight increase in her Tirosint dose. I will prescribe Tirosint  100 g by mouth every morning.    - We discussed about correct intake of levothyroxine, at fasting, with water, separated by at least 30 minutes from breakfast, and separated by more than 4 hours from calcium, iron, multivitamins, acid reflux medications (PPIs). -Patient is made aware of the fact that thyroid hormone replacement is needed for life, dose to be adjusted by periodic monitoring of thyroid function tests.  -She has no clinical goiter, hence, no need for thyroid/neck ultrasound.   - I advised patient to maintain close follow up with Ephriam Jenkins E for primary care needs. Follow up plan: Return in about 4 months (around 12/21/2016) for follow up with pre-visit labs.  Glade Lloyd, MD Phone: [phone]  Fax: [phone]   08/20/2016, 12:00 PM

## 2016-09-03 ENCOUNTER — Other Ambulatory Visit: Payer: Self-pay | Admitting: Neurology

## 2016-10-01 ENCOUNTER — Other Ambulatory Visit: Payer: Self-pay | Admitting: Neurology

## 2016-12-03 ENCOUNTER — Ambulatory Visit: Payer: BC Managed Care – PPO | Admitting: Neurology

## 2016-12-10 ENCOUNTER — Ambulatory Visit: Payer: BC Managed Care – PPO | Admitting: Neurology

## 2016-12-11 ENCOUNTER — Encounter: Payer: Self-pay | Admitting: Neurology

## 2016-12-11 ENCOUNTER — Ambulatory Visit (INDEPENDENT_AMBULATORY_CARE_PROVIDER_SITE_OTHER): Payer: BC Managed Care – PPO | Admitting: Neurology

## 2016-12-11 VITALS — BP 126/72 | HR 85 | Ht 62.0 in | Wt 144.1 lb

## 2016-12-11 DIAGNOSIS — G43009 Migraine without aura, not intractable, without status migrainosus: Secondary | ICD-10-CM | POA: Diagnosis not present

## 2016-12-11 NOTE — Progress Notes (Signed)
NEUROLOGY FOLLOW UP OFFICE NOTE  Laurie Cherry [id number]  HISTORY OF PRESENT ILLNESS: Laurie Cherry is a 56 year old right-handed female with rheumatoid arthritis, hypothyroidism, reflux, and mild depression who follows up for chronic migraine.   UPDATE: Intensity:  5-6/10; October: 5-6/10 Duration:  20-30 minutes; October 20-30 minutes Frequency:  Varies.  Approximately 10 to 14 days per month (except only 4 days in January); October: 5 migraine days in a month Current NSAIDS:  Advil (for arthritis), naproxen (infrequent) Current analgesics:  none Current triptans:  sumatriptan 50mg  (100mg  caused drowsiness) (w/wo naproxen 500mg ) Current anti-emetic:  none Current muscle relaxants:  none Current anti-anxiolytic:  none Current sleep aide:  none Current Antihypertensive medications:  atenolol:  Supposed to take 25mg  daily but often just takes 12.5mg  (50mg  caused dizziness and hypotension/bradycardia) Current Antidepressant medications:  Celexa Current Anticonvulsant medications:  none   Caffeine:  1 cup of coffee daily Alcohol:  no Smoker:  no Diet:  1/2 can of soda daily.  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  They have pretty much stabilized but she will still get it every now and then.     She was diagnosed with rheumatoid arthritis in 2014.  She had been on methotrexate for about 2 years (stopped a month ago).  In September 2015, she began having new headaches.  It coincided with change in her thyroid medications.  They are left sided, 7/10 non-throbbing pain.    They are associated with photophobia, phonophobia, and osmophobia but not nausea.   They typically last 3 to 4 days and occur 1 to 2 times a week (however she also has a dull  daily headache) Stress seems to be a trigger.  Change in thyroid medication may have been a trigger. These headaches do not correlate with her menstrual cycle.   Past abortive medication: Excedrin and tylenol (ineffective)   Past preventative medication included propranolol ER 80mg  (it was effective but she stopped because it caused bradycardia and hypotension), topiramate 25mg  (caused drowsiness, but willing to retry it if needed), venlafaxine XR 150mg    Family history of headache:  Mom, sister Mother had glioblastoma  PAST MEDICAL HISTORY: Past Medical History:  Diagnosis Date  . Depression   . Esophagitis   . GERD (gastroesophageal reflux disease)   . Hypothyroidism   . Rheumatoid arthritis ([place])   . Worsening headaches     MEDICATIONS: Current Outpatient Prescriptions on File Prior to Visit  Medication Sig Dispense Refill  . Adalimumab (HUMIRA PEN) 40 MG/0.8ML PNKT Inject into the skin.    Marland Kitchen atenolol (TENORMIN) 25 MG tablet Take 1 tablet (25 mg total) by mouth daily. 30 tablet 2  . citalopram (CELEXA) 20 MG tablet Take 10 mg by mouth daily.     . folic acid (FOLVITE) 1 MG tablet TK 1 T PO QD  2  . methotrexate (RHEUMATREX) 2.5 MG tablet Take 10 mg by mouth once a week. Caution:Chemotherapy. Protect from light.     . naproxen (NAPROSYN) 500 MG tablet TAKE 1 TABLET(500 MG) BY MOUTH EVERY 12 HOURS AS NEEDED 10 tablet 0  . SUMAtriptan (IMITREX) 100 MG tablet SEE NOTES 10 tablet 0  . TIROSINT 100 MCG CAPS Take 1 capsule (100 mcg total) by mouth daily before breakfast.  90 capsule 1  . VENTOLIN HFA 108 (90 Base) MCG/ACT inhaler INHALE 2 PUFFS PO Q 4 H PRN  0  . Vitamin D, Ergocalciferol, (DRISDOL) 50000 units CAPS capsule Take 50,000 Units by mouth every 7 (seven) days.      No current facility-administered medications on file prior to visit.     ALLERGIES: Allergies  Allergen Reactions  . Arava [Leflunomide] Hives  . Azithromycin Other (See Comments)    Severe bloating, abd  pain Severe bloating, abd pain  . Cefuroxime Other (See Comments)    Increased bloating, abd pain  . Cefuroxime Axetil Other (See Comments)    Increased bloating, abd pain  . Chamomile   . Clarithromycin Other (See Comments)    abd pain abd pain  . Etanercept Other (See Comments) and Hives    Injection site irritation Injection site irritation  . Levofloxacin Other (See Comments) and Hives    Severe dizziness Severe dizziness  . Methotrexate Derivatives   . Sulfa Antibiotics Hives    abd pain    FAMILY HISTORY: Family History  Problem Relation Age of Onset  . Thyroid disease Mother   . Heart attack Mother   . Stroke Mother   . Diabetes Mother   . Brain cancer Mother   . Colon cancer Father   . Heart attack Sister   . Hypertension Sister   . Hypertension Brother     SOCIAL HISTORY: Social History   Social History  . Marital status: Married    Spouse name: N/A  . Number of children: N/A  . Years of education: N/A   Occupational History  . Not on file.   Social History Main Topics  . Smoking status: Never Smoker  . Smokeless tobacco: Never Used  . Alcohol use No  . Drug use: No  . Sexual activity: Not on file   Other Topics Concern  . Not on file   Social History Narrative  . No narrative on file    REVIEW OF SYSTEMS: Constitutional: No fevers, chills, or sweats, no generalized fatigue, change in appetite Eyes: No visual changes, double vision, eye pain Ear, nose and throat: No hearing loss, ear pain, nasal congestion, sore throat Cardiovascular: No chest pain, palpitations Respiratory:  No shortness of breath at rest or with exertion, wheezes GastrointestinaI: No nausea, vomiting, diarrhea, abdominal pain, fecal incontinence Genitourinary:  No dysuria, urinary retention or frequency Musculoskeletal:  No neck pain, back pain Integumentary: No rash, pruritus, skin lesions Neurological: as above Psychiatric: No depression, insomnia,  anxiety Endocrine: No palpitations, fatigue, diaphoresis, mood swings, change in appetite, change in weight, increased thirst Hematologic/Lymphatic:  No purpura, petechiae. Allergic/Immunologic: no itchy/runny eyes, nasal congestion, recent allergic reactions, rashes  PHYSICAL EXAM: Vitals:   12/11/16 1402  BP: 126/72  Pulse: 85   General: No acute distress.  Patient appears well-groomed.  normal body habitus. Head:  Normocephalic/atraumatic Eyes:  Fundi examined but not visualized Neck: supple, no paraspinal tenderness, full range of motion Heart:  Regular rate and rhythm Lungs:  Clear to auscultation bilaterally Back: No paraspinal tenderness Neurological Exam: alert and oriented to person, place, and time. Attention span and concentration intact, recent and remote memory intact, fund of knowledge intact.  Speech fluent and not dysarthric, language intact.  CN II-XII intact. Bulk and tone normal, muscle strength 5/5 throughout.  Sensation to light touch  intact.  Deep tendon reflexes 2+ throughout.  Finger to nose testing intact.  Gait normal  IMPRESSION: Migraine  PLAN: 1.  Try taking the atenolol 25mg  daily.  Contact me in 6 weeks with update and we can switch to extended release Topamax if ineffective (which less likely has cognitive side effects than immediate release).  If you feel dizzy and lightheadedness (or blood pressure and heart rate is too low), contact me 2.  Use sumatriptan as needed 3.  Follow up in 3 months  Metta Clines, DO  CC: Thea Alken

## 2016-12-11 NOTE — Patient Instructions (Signed)
1.  Try taking the atenolol 25mg  daily.  Contact me in 6 weeks with update and we can switch to extended release Topamax if ineffective.  If you feel dizzy and lightheadedness (or blood pressure and heart rate is too low), contact me 2.  Use sumatriptan as needed 3.  Follow up in 3 months

## 2016-12-20 LAB — TSH: TSH: 2.69 u[IU]/mL (ref 0.450–4.500)

## 2016-12-20 LAB — T4, FREE: FREE T4: 1.23 ng/dL (ref 0.82–1.77)

## 2016-12-26 ENCOUNTER — Ambulatory Visit: Payer: BC Managed Care – PPO | Admitting: "Endocrinology

## 2016-12-27 ENCOUNTER — Telehealth: Payer: Self-pay | Admitting: "Endocrinology

## 2016-12-27 NOTE — Telephone Encounter (Signed)
Called pt. No answer °

## 2016-12-27 NOTE — Telephone Encounter (Signed)
Can we refer this pt to Dr Laural Golden as requested?

## 2016-12-27 NOTE — Telephone Encounter (Signed)
Patient states that Dr.Nida offered to refer her to Dr.Rehman if she wanted to see him.Patient called his office and they told her she needed the referral. Can Dr.Nida please send a referral for her and let her know?

## 2016-12-27 NOTE — Telephone Encounter (Signed)
I need to see her  first for her hypothyroidism appointment and get more information about the need for a referral.

## 2016-12-30 NOTE — Telephone Encounter (Signed)
Called pt. No answer. Awaiting return call.  

## 2017-01-01 ENCOUNTER — Encounter (INDEPENDENT_AMBULATORY_CARE_PROVIDER_SITE_OTHER): Payer: Self-pay | Admitting: Internal Medicine

## 2017-01-07 ENCOUNTER — Ambulatory Visit (INDEPENDENT_AMBULATORY_CARE_PROVIDER_SITE_OTHER): Payer: BC Managed Care – PPO | Admitting: "Endocrinology

## 2017-01-07 ENCOUNTER — Encounter: Payer: Self-pay | Admitting: "Endocrinology

## 2017-01-07 VITALS — BP 120/76 | HR 74 | Ht 62.0 in | Wt 139.0 lb

## 2017-01-07 DIAGNOSIS — E038 Other specified hypothyroidism: Secondary | ICD-10-CM

## 2017-01-07 MED ORDER — TIROSINT 100 MCG PO CAPS: 100.0000 ug | ORAL_CAPSULE | Freq: Every day | ORAL | 1 refills | Status: DC

## 2017-01-07 NOTE — Progress Notes (Signed)
Subjective:    Patient ID: Laurie Cherry, female    DOB: November 15, 1960, PCP Dorothy Puffer, FNP   Past Medical History:  Diagnosis Date  . Depression   . Esophagitis   . GERD (gastroesophageal reflux disease)   . Hypothyroidism   . Rheumatoid arthritis ([place])   . Worsening headaches    Past Surgical History:  Procedure Laterality Date  . EYE SURGERY Right    Social History   Social History  . Marital status: Married    Spouse name: N/A  . Number of children: N/A  . Years of education: N/A   Social History Main Topics  . Smoking status: Never Smoker  . Smokeless tobacco: Never Used  . Alcohol use No  . Drug use: No  . Sexual activity: Not Asked   Other Topics Concern  . None   Social History Narrative  . None   Outpatient Encounter Prescriptions as of 01/07/2017  Medication Sig  . moxifloxacin (AVELOX) 400 MG tablet Take 400 mg by mouth daily at 8 pm.  . Adalimumab (HUMIRA PEN) 40 MG/0.8ML PNKT Inject into the skin.  Marland Kitchen atenolol (TENORMIN) 25 MG tablet Take 1 tablet (25 mg total) by mouth daily.  . Cholecalciferol 5000 units TABS Take by mouth daily.  . citalopram (CELEXA) 20 MG tablet Take 10 mg by mouth daily.   . folic acid (FOLVITE) 1 MG tablet TK 1 T PO QD  . methotrexate (RHEUMATREX) 2.5 MG tablet Take 10 mg by mouth once a week. Caution:Chemotherapy. Protect from light.   . naproxen (NAPROSYN) 500 MG tablet TAKE 1 TABLET(500 MG) BY MOUTH EVERY 12 HOURS AS NEEDED  . SUMAtriptan (IMITREX) 100 MG tablet SEE NOTES  . TIROSINT 100 MCG CAPS Take 1 capsule (100 mcg total) by mouth daily before breakfast.  . VENTOLIN HFA 108 (90 Base) MCG/ACT inhaler INHALE 2 PUFFS PO Q 4 H PRN  . Vitamin D, Ergocalciferol, (DRISDOL) 50000 units CAPS capsule Take 50,000 Units by mouth every 7 (seven) days.   . [DISCONTINUED] TIROSINT 100 MCG CAPS Take 1 capsule (100 mcg total) by mouth daily before breakfast.   No facility-administered encounter medications on file as of  01/07/2017.    ALLERGIES: Allergies  Allergen Reactions  . Arava [Leflunomide] Hives  . Azithromycin Other (See Comments)    Severe bloating, abd pain Severe bloating, abd pain  . Cefuroxime Other (See Comments)    Increased bloating, abd pain  . Cefuroxime Axetil Other (See Comments)    Increased bloating, abd pain  . Chamomile   . Clarithromycin Other (See Comments)    abd pain abd pain  . Etanercept Other (See Comments) and Hives    Injection site irritation Injection site irritation  . Levofloxacin Other (See Comments) and Hives    Severe dizziness Severe dizziness  . Methotrexate Derivatives   . Sulfa Antibiotics Hives    abd pain   VACCINATION STATUS:  There is no immunization history on file for this patient.  HPI  56 year old female patient with medical history as above. She is being seen in f/u for long-standing hypothyroidism requested by her primary care provider, Elisabeth Most   She states that she was diagnosed with hypothyroidism approximately at age 38. Currently onTirosint 100 g by mouth every morning. This is due to her intolerance to regular levothyroxine, Synthroid. She denies new complaints today.   -She is compliant to her medications  She has family  history of thyroid dysfunction in multiple members  of her family.  She denies any history of goiter. She denies any family history of thyroid cancer.  -She is on multiple medications for her rheumatoid arthritis.   Review of Systems  Constitutional: -  weight gain, + fatigue,  +subjective hypothermia Eyes: no blurry vision, no xerophthalmia ENT: no sore throat, no nodules palpated in throat, no dysphagia/odynophagia, no hoarseness Cardiovascular: no CP/SOB/palpitations/leg swelling Respiratory: no cough/SOB Gastrointestinal: no N/V/D/C Musculoskeletal: no muscle/joint aches Skin: no rashes Neurological: no tremors/numbness/tingling/dizziness Psychiatric: no  depression/anxiety   Objective:    BP 120/76   Pulse 74   Ht 5\' 2"  (1.575 m)   Wt 139 lb (63 kg)   BMI 25.42 kg/m   Wt Readings from Last 3 Encounters:  01/07/17 139 lb (63 kg)  12/11/16 144 lb 1.6 oz (65.4 kg)  08/20/16 143 lb (64.9 kg)    Physical Exam  Constitutional:  in NAD Eyes: PERRLA, EOMI, no exophthalmos ENT: moist mucous membranes, no thyromegaly, no cervical lymphadenopathy Cardiovascular: RRR, No MRG Respiratory: CTA B Gastrointestinal: abdomen soft, NT, ND, BS+ Musculoskeletal: no deformities, strength intact in all 4 Skin: moist, warm, no rashes Neurological: no tremor with outstretched hands, DTR normal in all 4  - On 08/16/2016 her free T4 was 1.36, improving from prior studies. - On 05/09/2016: Free T4 0.63, TSH 7.58, TPO antibodies 13  Assessment & Plan:   1.  hypothyroidism - Her thyroid function tests are consistent with appropriate replacement.  I will  continue Tirosint  100 g by mouth every morning.    - We discussed about correct intake of levothyroxine, at fa[place]sting, with water, separated by at least 30 minutes from breakfast, and separated by more than 4 hours from calcium, iron, multivitamins, acid reflux medications (PPIs). -Patient is made aware of the fact that thyroid hormone replacement is needed for life, dose to be adjusted by periodic monitoring of thyroid function tests.  -She has no clinical goiter, hence, no need for thyroid/neck ultrasound.   - I advised patient to maintain close follow up with HARRIS, Dani Gobble, FNP for primary care needs. Follow up plan: Return in about 6 months (around 07/10/2017) for follow up with pre-visit labs.  Glade Lloyd, MD Phone: [phone]  Fax: [phone]   01/07/2017, 2:39 PM

## 2017-01-20 ENCOUNTER — Ambulatory Visit (INDEPENDENT_AMBULATORY_CARE_PROVIDER_SITE_OTHER): Payer: BC Managed Care – PPO | Admitting: Internal Medicine

## 2017-01-20 ENCOUNTER — Encounter (INDEPENDENT_AMBULATORY_CARE_PROVIDER_SITE_OTHER): Payer: Self-pay | Admitting: Internal Medicine

## 2017-01-20 VITALS — BP 140/72 | HR 76 | Temp 98.2°F | Ht 62.0 in | Wt 138.5 lb

## 2017-01-20 DIAGNOSIS — M069 Rheumatoid arthritis, unspecified: Secondary | ICD-10-CM

## 2017-01-20 DIAGNOSIS — K5732 Diverticulitis of large intestine without perforation or abscess without bleeding: Secondary | ICD-10-CM

## 2017-01-20 HISTORY — DX: Rheumatoid arthritis, unspecified: M06.9

## 2017-01-20 LAB — CBC WITH DIFFERENTIAL/PLATELET
Basophils Absolute: 0 cells/uL (ref 0–200)
Basophils Relative: 0 %
EOS PCT: 4 %
Eosinophils Absolute: 292 cells/uL (ref 15–500)
HCT: 31.4 % — ABNORMAL LOW (ref 35.0–45.0)
Hemoglobin: 10.4 g/dL — ABNORMAL LOW (ref 11.7–15.5)
LYMPHS ABS: 1898 {cells}/uL (ref 850–3900)
LYMPHS PCT: 26 %
MCH: 30.3 pg (ref 27.0–33.0)
MCHC: 33.1 g/dL (ref 32.0–36.0)
MCV: 91.5 fL (ref 80.0–100.0)
MPV: 9.7 fL (ref 7.5–12.5)
Monocytes Absolute: 657 cells/uL (ref 200–950)
Monocytes Relative: 9 %
NEUTROS PCT: 61 %
Neutro Abs: 4453 cells/uL (ref 1500–7800)
Platelets: 326 10*3/uL (ref 140–400)
RBC: 3.43 MIL/uL — AB (ref 3.80–5.10)
RDW: 14.8 % (ref 11.0–15.0)
WBC: 7.3 10*3/uL (ref 3.8–10.8)

## 2017-01-20 NOTE — Progress Notes (Addendum)
Subjective:    Patient ID: Laurie Cherry, female    DOB: 1961-07-04, 56 y.o.   MRN: [id number]  HPI Referred by Dr. Posey Pronto  for diverticulitis. Seen in their office 12/23/2016 and empirically treated for diverticulitis.  Rx for Moxifloxacin x 14 days. Received Im injection of Rocephin while in office. No CT's were performed.  She had a fever, sorethroat, and lower abdominal pain. She thought she had a UTI.  She is better.  She says last week she had a low grade fever. She feels 75% better.  Family hx of colon cancer in a father in his 63s. Lived to be 96.  Mother has hx of diverticulitis.  She says her urine looked cloudy. No burning on urination.   Hx of RA and maintained on Methotrexate and Humira. Hypothyroid and followed by Dr. Dorris Fetch.   Retired Education officer, [employer].   Her last colonoscopy was in July of 2013.  diverticular left colon but otherwise normal colonoscopy.   01/18/2011 EGD:  Chest pian. Hx of reflux. ? Eosinophilic esophagitis in the past.  Normal. Biopsy: Reflux esophagitis.   Review of Systems Past Medical History:  Diagnosis Date  . Depression   . Esophagitis   . GERD (gastroesophageal reflux disease)   . Hypothyroidism   . Rheumatoid arthritis ([place])   . Rheumatoid arthritis ([place]) 01/20/2017  . Worsening headaches     Past Surgical History:  Procedure Laterality Date  . EYE SURGERY Right     Allergies  Allergen Reactions  . Arava [Leflunomide] Hives  . Azithromycin Other (See Comments)    Severe bloating, abd pain Severe bloating, abd pain  . Cefuroxime Other (See Comments)    Increased bloating, abd pain  . Cefuroxime Axetil Other (See Comments)    Increased bloating, abd pain  . Chamomile   . Clarithromycin Other (See Comments)    abd pain abd pain  . Etanercept Other (See Comments) and Hives    Injection site irritation Injection site irritation  . Levofloxacin Other (See Comments) and Hives    Severe dizziness Severe dizziness  . Methotrexate  Derivatives   . Sulfa Antibiotics Hives    abd pain    Current Outpatient Prescriptions on File Prior to Visit  Medication Sig Dispense Refill  . Adalimumab (HUMIRA PEN) 40 MG/0.8ML PNKT Inject into the skin. Every other week    . atenolol (TENORMIN) 25 MG tablet Take 1 tablet (25 mg total) by mouth daily. 30 tablet 2  . Cholecalciferol 5000 units TABS Take 5,000 Units by mouth daily.     . citalopram (CELEXA) 20 MG tablet Take 10 mg by mouth daily.     . folic acid (FOLVITE) 1 MG tablet TK 1 T PO QD  2  . methotrexate (RHEUMATREX) 2.5 MG tablet Take 10 mg by mouth once a week. Caution:Chemotherapy. Protect from light.     . naproxen (NAPROSYN) 500 MG tablet TAKE 1 TABLET(500 MG) BY MOUTH EVERY 12 HOURS AS NEEDED 10 tablet 0  . SUMAtriptan (IMITREX) 100 MG tablet SEE NOTES 10 tablet 0  . TIROSINT 100 MCG CAPS Take 1 capsule (100 mcg total) by mouth daily before breakfast. 90 capsule 1  . VENTOLIN HFA 108 (90 Base) MCG/ACT inhaler INHALE 2 PUFFS PO Q 4 H PRN  0  . Vitamin D, Ergocalciferol, (DRISDOL) 50000 units CAPS capsule Take 50,000 Units by mouth every 7 (seven) days.     Marland Kitchen moxifloxacin (AVELOX) 400 MG tablet Take 400 mg by mouth daily  at 8 pm.     No current facility-administered medications on file prior to visit.        Objective:   Physical Exam Blood pressure 140/72, pulse 76, temperature 98.2 F (36.8 C), height 5\' 2"  (1.575 m), weight 138 lb 8 oz (62.8 kg).  Alert and oriented. Skin warm and dry. Oral mucosa is moist.   . Sclera anicteric, conjunctivae is pink. Thyroid not enlarged. No cervical lymphadenopathy. Lungs clear. Heart regular rate and rhythm.  Abdomen is soft. Bowel sounds are positive. No hepatomegaly. No abdominal masses felt. Slight LLQ tenderness.  No edema to lower extremities.       Assessment & Plan:  Diverticulitis. CT abdomen/pelvis with CM. CBC and urinalysis.  Will get colonoscopy and EGD reports from Dr. Smitty Knudsen.

## 2017-01-20 NOTE — Patient Instructions (Signed)
Labs and CT.  

## 2017-01-21 LAB — URINALYSIS
Bilirubin Urine: NEGATIVE
Glucose, UA: NEGATIVE
Hgb urine dipstick: NEGATIVE
Ketones, ur: NEGATIVE
Leukocytes, UA: NEGATIVE
Nitrite: NEGATIVE
Protein, ur: NEGATIVE
Specific Gravity, Urine: 1.004 (ref 1.001–1.035)
pH: 7 (ref 5.0–8.0)

## 2017-01-22 ENCOUNTER — Other Ambulatory Visit (INDEPENDENT_AMBULATORY_CARE_PROVIDER_SITE_OTHER): Payer: Self-pay | Admitting: *Deleted

## 2017-01-22 ENCOUNTER — Encounter (INDEPENDENT_AMBULATORY_CARE_PROVIDER_SITE_OTHER): Payer: Self-pay | Admitting: *Deleted

## 2017-01-22 ENCOUNTER — Other Ambulatory Visit: Payer: Self-pay | Admitting: Neurology

## 2017-01-22 DIAGNOSIS — K5732 Diverticulitis of large intestine without perforation or abscess without bleeding: Secondary | ICD-10-CM

## 2017-01-23 ENCOUNTER — Telehealth: Payer: Self-pay

## 2017-01-23 NOTE — Telephone Encounter (Signed)
Patient called wanting to update you on her medication. She said the Imitrex does help, but sometimes she will take Naproxen to follow it. Thank you

## 2017-01-23 NOTE — Telephone Encounter (Signed)
Pt called w/ 6 week update:  Says HA's have increased since last OV. Had 24/28 HA days in Feb; March- 24 HA days so far Does not think Atenolol is working. Would like to try Propranolol before trying any other alt med. Called patient to see if Imitrex provides relief. No answer.

## 2017-01-27 NOTE — Telephone Encounter (Signed)
Spoke to patient.  Went over Dr. Georgie Chard previous note. She verbalized understanding but does not want to take anything other than a lower dose of propranolol. Advised would let Dr. Tomi Likens know

## 2017-01-27 NOTE — Telephone Encounter (Signed)
She already tried propranolol and had reportedly developed low blood pressure and low heart rate, so it was discontinued.  She said she was willing to try topiramate 25mg  at bedtime again (alternatively, we can try Trokendi XR 25mg  at bedtime, as the extended release form may be more tolerable with less side effects).  Otherwise, I would try nortriptyline 10mg  at bedtime (which is an antidepressant used for migraine prevention).  We can increase dose in 4 weeks if needed.

## 2017-01-27 NOTE — Telephone Encounter (Signed)
Her blood pressure last visit looked okay, so we can try the propranolol (propranolol ER 60mg  daily).  She should monitor for lightheadedness and dizziness.

## 2017-01-29 ENCOUNTER — Other Ambulatory Visit: Payer: Self-pay

## 2017-01-29 MED ORDER — PROPRANOLOL HCL ER 60 MG PO CP24: 60.0000 mg | ORAL_CAPSULE | Freq: Every day | ORAL | 0 refills | Status: DC

## 2017-01-29 NOTE — Telephone Encounter (Addendum)
Spoke to patient. Gave instructions.Confirmed pharm on file correct. Sent rx.

## 2017-01-30 ENCOUNTER — Telehealth (INDEPENDENT_AMBULATORY_CARE_PROVIDER_SITE_OTHER): Payer: Self-pay | Admitting: Internal Medicine

## 2017-01-30 ENCOUNTER — Other Ambulatory Visit (INDEPENDENT_AMBULATORY_CARE_PROVIDER_SITE_OTHER): Payer: Self-pay | Admitting: Internal Medicine

## 2017-01-30 ENCOUNTER — Ambulatory Visit (HOSPITAL_COMMUNITY)
Admission: RE | Admit: 2017-01-30 | Discharge: 2017-01-30 | Disposition: A | Discharge status: Home / Self Care | Payer: BC Managed Care – PPO | Source: Ambulatory Visit | Attending: Internal Medicine | Admitting: Internal Medicine

## 2017-01-30 ENCOUNTER — Encounter (HOSPITAL_COMMUNITY): Payer: Self-pay | Admitting: Radiology

## 2017-01-30 DIAGNOSIS — R1900 Intra-abdominal and pelvic swelling, mass and lump, unspecified site: Secondary | ICD-10-CM | POA: Insufficient documentation

## 2017-01-30 DIAGNOSIS — D259 Leiomyoma of uterus, unspecified: Secondary | ICD-10-CM | POA: Insufficient documentation

## 2017-01-30 DIAGNOSIS — K5732 Diverticulitis of large intestine without perforation or abscess without bleeding: Secondary | ICD-10-CM | POA: Insufficient documentation

## 2017-01-30 NOTE — Telephone Encounter (Signed)
Please send a copy of her CT to Dr. Shelly Coss in [place]

## 2017-01-30 NOTE — Telephone Encounter (Signed)
[place] sch'd 02/04/17 at 1130 (11:15), needs a full bladder, patient aware

## 2017-01-30 NOTE — Telephone Encounter (Signed)
Ordered

## 2017-01-30 NOTE — Telephone Encounter (Signed)
Report faxed to Dr Oswaldo Done

## 2017-01-30 NOTE — Telephone Encounter (Signed)
Pelvic US ordered.

## 2017-01-30 NOTE — Telephone Encounter (Signed)
Per scheduling you need to add [id number] US transvaginal non-ob to go with US pelvic order before I can schedule

## 2017-01-30 NOTE — Telephone Encounter (Signed)
Laurie Cherry, Pelvic US

## 2017-02-04 ENCOUNTER — Telehealth (INDEPENDENT_AMBULATORY_CARE_PROVIDER_SITE_OTHER): Payer: Self-pay | Admitting: Internal Medicine

## 2017-02-04 ENCOUNTER — Ambulatory Visit (HOSPITAL_COMMUNITY)
Admission: RE | Admit: 2017-02-04 | Discharge: 2017-02-04 | Disposition: A | Discharge status: Home / Self Care | Payer: BC Managed Care – PPO | Source: Ambulatory Visit | Attending: Internal Medicine | Admitting: Internal Medicine

## 2017-02-04 ENCOUNTER — Encounter (INDEPENDENT_AMBULATORY_CARE_PROVIDER_SITE_OTHER): Payer: Self-pay

## 2017-02-04 DIAGNOSIS — R1900 Intra-abdominal and pelvic swelling, mass and lump, unspecified site: Secondary | ICD-10-CM

## 2017-02-04 DIAGNOSIS — D259 Leiomyoma of uterus, unspecified: Secondary | ICD-10-CM | POA: Diagnosis not present

## 2017-02-04 NOTE — Telephone Encounter (Signed)
Report faxed.

## 2017-02-04 NOTE — Telephone Encounter (Signed)
Please send [place] report to Dr. Oswaldo Done

## 2017-02-05 ENCOUNTER — Telehealth (INDEPENDENT_AMBULATORY_CARE_PROVIDER_SITE_OTHER): Payer: Self-pay | Admitting: Internal Medicine

## 2017-02-05 DIAGNOSIS — R1900 Intra-abdominal and pelvic swelling, mass and lump, unspecified site: Secondary | ICD-10-CM

## 2017-02-05 NOTE — Telephone Encounter (Signed)
[facility] will contact patient for appt, patient aware

## 2017-02-05 NOTE — Telephone Encounter (Signed)
Will refer to Dr Glo Herring, [facility]. I discussed with Dr. Laural Golden.

## 2017-02-06 ENCOUNTER — Telehealth (INDEPENDENT_AMBULATORY_CARE_PROVIDER_SITE_OTHER): Payer: Self-pay | Admitting: Internal Medicine

## 2017-02-06 NOTE — Telephone Encounter (Signed)
Patient returned your calls.    [phone]

## 2017-02-10 NOTE — Telephone Encounter (Signed)
I have talked with patient  

## 2017-02-12 ENCOUNTER — Telehealth (INDEPENDENT_AMBULATORY_CARE_PROVIDER_SITE_OTHER): Payer: Self-pay | Admitting: Internal Medicine

## 2017-02-12 DIAGNOSIS — R1900 Intra-abdominal and pelvic swelling, mass and lump, unspecified site: Secondary | ICD-10-CM

## 2017-02-12 NOTE — Telephone Encounter (Signed)
Referral to Louretta Shorten

## 2017-03-17 ENCOUNTER — Ambulatory Visit (INDEPENDENT_AMBULATORY_CARE_PROVIDER_SITE_OTHER): Payer: BC Managed Care – PPO | Admitting: Neurology

## 2017-03-17 ENCOUNTER — Encounter: Payer: Self-pay | Admitting: Neurology

## 2017-03-17 VITALS — BP 100/60 | HR 73 | Ht 62.0 in | Wt 135.0 lb

## 2017-03-17 DIAGNOSIS — G43009 Migraine without aura, not intractable, without status migrainosus: Secondary | ICD-10-CM | POA: Diagnosis not present

## 2017-03-17 NOTE — Patient Instructions (Signed)
Migraine Recommendations: 1.  Continue propranolol ER 60mg  daily 2.  Take sumatriptan at earliest onset of headache.  May repeat dose once in 2 hours if needed.  Do not exceed two tablets in 24 hours. 3.  Limit use of pain relievers to no more than 2 days out of the week.  These medications include acetaminophen, ibuprofen, triptans and narcotics.  This will help reduce risk of rebound headaches. 4.  Be aware of common food triggers such as processed sweets, processed foods with nitrites (such as deli meat, hot dogs, sausages), foods with MSG, alcohol (such as wine), chocolate, certain cheeses, certain fruits (dried fruits, some citrus fruit), vinegar, diet soda. 4.  Avoid caffeine 5.  Routine exercise 6.  Proper sleep hygiene 7.  Stay adequately hydrated with water 8.  Keep a headache diary. 9.  Maintain proper stress management. 10.  Do not skip meals. 11.  Consider supplements:  Magnesium citrate 400mg  to 600mg  daily, riboflavin 400mg , Coenzyme Q 10 100mg  three times daily 12.  Follow up in 3 to 4 months.

## 2017-03-17 NOTE — Progress Notes (Signed)
NEUROLOGY FOLLOW UP OFFICE NOTE  Laurie Cherry [id number]  HISTORY OF PRESENT ILLNESS: Laurie Cherry is a 56 year old right-handed female with rheumatoid arthritis, hypothyroidism, reflux, and mild depression who follows up for chronic migraine.   UPDATE: Migraines under better control again.  She is tolerating propranolol. Intensity: 5-6/10; February: 5-6/10 Duration:  20-30 minutes; February: 20-30 minutes Frequency:  6 out of 21 days in May, 10 out of 30 days in April; February: 10-14 headache days per month Current NSAIDS:  Advil (for arthritis), naproxen (infrequent) Current analgesics:  none Current triptans:  sumatriptan 50mg  (100mg  caused drowsiness) (w/wo naproxen 500mg ) Current anti-emetic:  none Current muscle relaxants:  none Current anti-anxiolytic:  none Current sleep aide:  none Current Antihypertensive medications:  propranolol ER 60mg  Current Antidepressant medications:  Celexa Current Anticonvulsant medications:  none   Caffeine:  1 cup of coffee daily Alcohol:  no Smoker:  no Diet:  1/2 can of soda daily.  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  They have pretty much stabilized but she will still get it every now and then.     She was diagnosed with rheumatoid arthritis in 2014.  She had been on methotrexate for about 2 years (stopped a month ago).  In September 2015, she began having new headaches.  It coincided with change in her thyroid medications.  They are left sided, 7/10 non-throbbing pain.    They are associated with photophobia, phonophobia, and osmophobia but not nausea.   They typically last 3 to 4 days and occur 1 to 2 times a week (however she also has a dull daily headache) Stress seems to be a trigger.   Change in thyroid medication may have been a trigger. These headaches do not correlate with her menstrual cycle.   Past NSAIDS:  no Past analgesics:  Excedrin, Tylenol (ineffective) Past abortive triptans:  no Past muscle relaxants:  no Past anti-emetic:  no Past antihypertensive medications:  atenolol (25mg  ineffective and higher doses caused dizziness) Past antidepressant medications:  venlafaxine XR 150mg  Past anticonvulsant medications:  topiramate 25mg  (caused drowsiness, but willing to retry it if needed) Past vitamins/Herbal/Supplements:  no Past antihistamines/decongestants:  no Other past therapies:  no   Family history of headache:  Mom, sister Mother had glioblastoma  PAST MEDICAL HISTORY: Past Medical History:  Diagnosis Date  . Depression   . Esophagitis   . Fibroids   . GERD (gastroesophageal reflux disease)   . Hypothyroidism   . Rheumatoid arthritis ([place])   . Rheumatoid arthritis ([place]) 01/20/2017  . Worsening headaches     MEDICATIONS: Current Outpatient Prescriptions on File Prior to Visit  Medication Sig Dispense Refill  . Cholecalciferol 5000 units TABS Take 5,000 Units by mouth daily.     . citalopram (CELEXA) 20 MG tablet Take 10 mg by mouth daily.     . folic acid (FOLVITE) 1 MG tablet TK 1 T PO QD  2  . naproxen (NAPROSYN) 500 MG tablet TAKE 1 TABLET(500 MG) BY MOUTH EVERY 12 HOURS AS NEEDED 10 tablet 0  . propranolol ER (INDERAL LA) 60 MG 24 hr capsule Take 1 capsule (60 mg total) by mouth daily. 30 capsule 0  . SUMAtriptan (IMITREX) 100 MG tablet SEE NOTES 10 tablet 0  .  TIROSINT 100 MCG CAPS Take 1 capsule (100 mcg total) by mouth daily before breakfast. 90 capsule 1  . VENTOLIN HFA 108 (90 Base) MCG/ACT inhaler INHALE 2 PUFFS PO Q 4 H PRN  0  . Vitamin D, Ergocalciferol, (DRISDOL) 50000 units CAPS capsule Take 50,000 Units by mouth every 7 (seven) days.     . Adalimumab (HUMIRA PEN) 40 MG/0.8ML PNKT Inject into the skin. Every other week    .  methotrexate (RHEUMATREX) 2.5 MG tablet Take 10 mg by mouth once a week. Caution:Chemotherapy. Protect from light.      No current facility-administered medications on file prior to visit.     ALLERGIES: Allergies  Allergen Reactions  . Arava [Leflunomide] Hives  . Azithromycin Other (See Comments)    Severe bloating, abd pain Severe bloating, abd pain  . Cefuroxime Other (See Comments)    Increased bloating, abd pain  . Cefuroxime Axetil Other (See Comments)    Increased bloating, abd pain  . Chamomile   . Clarithromycin Other (See Comments)    abd pain abd pain  . Etanercept Other (See Comments) and Hives    Injection site irritation Injection site irritation  . Levofloxacin Other (See Comments) and Hives    Severe dizziness Severe dizziness  . Methotrexate Derivatives   . Sulfa Antibiotics Hives    abd pain    FAMILY HISTORY: Family History  Problem Relation Age of Onset  . Thyroid disease Mother   . Heart attack Mother   . Stroke Mother   . Diabetes Mother   . Brain cancer Mother   . Colon cancer Father   . Heart attack Sister   . Hypertension Sister   . Hypertension Brother     SOCIAL HISTORY: Social History   Social History  . Marital status: Married    Spouse name: N/A  . Number of children: N/A  . Years of education: N/A   Occupational History  . Not on file.   Social History Main Topics  . Smoking status: Never Smoker  . Smokeless tobacco: Never Used  . Alcohol use No  . Drug use: No  . Sexual activity: Not on file   Other Topics Concern  . Not on file   Social History Narrative  . No narrative on file    REVIEW OF SYSTEMS: Constitutional: No fevers, chills, or sweats, no generalized fatigue, change in appetite Eyes: No visual changes, double vision, eye pain Ear, nose and throat: No hearing loss, ear pain, nasal congestion, sore throat Cardiovascular: No chest pain, palpitations Respiratory:  No shortness of breath at rest or with  exertion, wheezes GastrointestinaI: No nausea, vomiting, diarrhea, abdominal pain, fecal incontinence Genitourinary:  No dysuria, urinary retention or frequency Musculoskeletal:  No neck pain, back pain Integumentary: No rash, pruritus, skin lesions Neurological: as above Psychiatric: No depression, insomnia, anxiety Endocrine: No palpitations, fatigue, diaphoresis, mood swings, change in appetite, change in weight, increased thirst Hematologic/Lymphatic:  No purpura, petechiae. Allergic/Immunologic: no itchy/runny eyes, nasal congestion, recent allergic reactions, rashes  PHYSICAL EXAM: Vitals:   03/17/17 1352  BP: 100/60  Pulse: 73   General: No acute distress.  Patient appears well-groomed.  normal body habitus. Head:  Normocephalic/atraumatic Eyes:  Fundi examined but not visualized Neck: supple, no paraspinal tenderness, full range of motion Heart:  Regular rate and rhythm Lungs:  Clear to auscultation bilaterally Back: No paraspinal tenderness Neurological Exam: alert and oriented to person, place, and time. Attention span and concentration intact, recent and remote  memory intact, fund of knowledge intact.  Speech fluent and not dysarthric, language intact.  CN II-XII intact. Bulk and tone normal, muscle strength 5/5 throughout.  Sensation to light touch  intact.  Deep tendon reflexes 2+ throughout, toes downgoing.  Finger to nose testing intact.  Gait normal, Romberg negative.  IMPRESSION: Migraine, better controlled  PLAN: 1.  Propranolol ER 60mg  daily 2.  Sumatriptan as needed 3.  Follow up in 3 to 4 months.  Metta Clines, DO  CC:  Ephriam Jenkins, FNP

## 2017-03-20 ENCOUNTER — Other Ambulatory Visit: Payer: Self-pay | Admitting: Neurology

## 2017-03-20 DIAGNOSIS — G43009 Migraine without aura, not intractable, without status migrainosus: Secondary | ICD-10-CM

## 2017-03-20 NOTE — Telephone Encounter (Signed)
Rx sent 

## 2017-03-26 NOTE — Progress Notes (Signed)
Please place orders in EPIC as patient has a pre-op appointment on 03/31/2017! Thank you!

## 2017-03-27 NOTE — H&P (Addendum)
  Laurie Cherry [id number] 03/26/2017 S:  [name] presents today for pelvic pain, left and right adnexal masses, fibroids and a thickened endometrium.  She was originally referred to me for evaluation of this.  Recent ultrasound on 03/03/2017 showed a 9.5 cm left adnexal mass most suspicious for endometrioma, possible mucinous cystadenoma.  There is a right 2 cm adnexal mass which we believe arises from the uterus, suspicion for a subserosal fibroid, cannot rule out a fibroma.  This was also confirmed by CT.  Endometrial thickness was 7 mm upon saline infusion ultrasound.  Since she is menopausal based on [facility], we did do an endometrial biopsy which returned benign.  Laurie Cherry has been having worsening problems with pelvic pain both left and right.  This has happened for a number of years, recently worsened.  She does have multiple fibroids noted as well.  She presents for definitive surgical intervention.  She had a normal CA-125 and normal CEA.  Plan to proceed with hysterectomy with removal of both ovaries and tubes.   O:  Physical exam:  Heart is regular rate and rhythm.  Lungs are clear to auscultation bilaterally.  Abdomen is nondistended and nontender, probable uterus and mass just above the pubic symphysis.  Pelvic exam:  Large adnexal mass in the left adnexal area approximately 10 cm in size.  Uterus is slightly irregular, nontender.  Minimal tenderness in both adnexal areas.   ULTRASOUND:  See above.  PAST MEDICAL HISTORY:  Significant for hypothyroidism and also rheumatoid arthritis currently on methotrexate and Humira, anxiety currently controlled on Celexa.  ALLERGIES:  She does have multiple allergies but can take penicillin and cephalosporins.   A/P:  Pelvic pain, left and right adnexal masses, fibroids, thickened endometrium with a normal biopsy, normal CA-125 and CEA.  Plan a total abdominal hysterectomy and bilateral salpingo-oophorectomy and necessary procedures if this does appear to be malignant or  premalignant.  Discussed frozen pathology at the time and possible omentectomy.  Discussed the pros and cons and risks and benefits, recovery at length.  She does give her informed consent and wishes to proceed.  All of her questions were answered.  She is going to hold on her Humira until after the procedure.   04/03/17 0715 This patient has been seen and examined.   All of her questions were answered.  Labs and vital signs reviewed.  Informed consent has been obtained.  The History and Physical is current. DL

## 2017-03-27 NOTE — Progress Notes (Signed)
Please place orders in EPIC as patient has a pre-op appointment on 03/31/2017! Thank you!

## 2017-03-28 NOTE — Patient Instructions (Addendum)
Laurie Cherry  03/28/2017   Your procedure is scheduled on: 04-03-17  Report to [facility] Main  Entrance Take [place]  Elevators to 3rd floor to [place] at 5:30 AM.    Call this number if you have problems the morning of surgery [phone]   Remember: ONLY 1 PERSON MAY GO WITH YOU TO SHORT STAY TO GET  READY MORNING OF [name].  Do not eat food or drink liquids :After Midnight.     Take these medicines the morning of surgery with A SIP OF WATER: Tirosint (Levothyroxine).                                You may not have any metal on your body including hair pins and              piercings  Do not wear jewelry, make-up, lotions, powders or perfumes, deodorant             Do not wear nail polish.  Do not shave  48 hours prior to surgery.     Do not bring valuables to the hospital. [place].  Contacts, dentures or bridgework may not be worn into surgery.  Leave suitcase in the car. After surgery it may be brought to your room.                Please read over the following fact sheets you were given: _____________________________________________________________________             [facility] - Preparing for Surgery Before surgery, you can play an important role.  Because skin is not sterile, your skin needs to be as free of germs as possible.  You can reduce the number of germs on your skin by washing with CHG (chlorahexidine gluconate) soap before surgery.  CHG is an antiseptic cleaner which kills germs and bonds with the skin to continue killing germs even after washing. Please DO NOT use if you have an allergy to CHG or antibacterial soaps.  If your skin becomes reddened/irritated stop using the CHG and inform your nurse when you arrive at Short Stay. Do not shave (including legs and underarms) for at least 48 hours prior to the first CHG shower.  You may shave your face/neck. Please follow  these instructions carefully:  1.  Shower with CHG Soap the night before surgery and the  morning of Surgery.  2.  If you choose to wash your hair, wash your hair first as usual with your  normal  shampoo.  3.  After you shampoo, rinse your hair and body thoroughly to remove the  shampoo.                           4.  Use CHG as you would any other liquid soap.  You can apply chg directly  to the skin and wash                       Gently with a scrungie or clean washcloth.  5.  Apply the CHG Soap to your body ONLY FROM THE NECK DOWN.   Do not use on face/ open  Wound or open sores. Avoid contact with eyes, ears mouth and genitals (private parts).                       Wash face,  Genitals (private parts) with your normal soap.             6.  Wash thoroughly, paying special attention to the area where your surgery  will be performed.  7.  Thoroughly rinse your body with warm water from the neck down.  8.  DO NOT shower/wash with your normal soap after using and rinsing off  the CHG Soap.                9.  Pat yourself dry with a clean towel.            10.  Wear clean pajamas.            11.  Place clean sheets on your bed the night of your first shower and do not  sleep with pets. Day of Surgery : Do not apply any lotions/deodorants the morning of surgery.  Please wear clean clothes to the hospital/surgery center.  FAILURE TO FOLLOW THESE INSTRUCTIONS MAY RESULT IN THE CANCELLATION OF YOUR SURGERY PATIENT SIGNATURE_________________________________  NURSE SIGNATURE__________________________________  ________________________________________________________________________

## 2017-03-31 ENCOUNTER — Encounter (HOSPITAL_COMMUNITY)
Admission: RE | Admit: 2017-03-31 | Discharge: 2017-03-31 | Disposition: A | Discharge status: Home / Self Care | Payer: BC Managed Care – PPO | Source: Ambulatory Visit | Attending: Obstetrics and Gynecology | Admitting: Obstetrics and Gynecology

## 2017-03-31 ENCOUNTER — Encounter (HOSPITAL_COMMUNITY): Payer: Self-pay

## 2017-03-31 HISTORY — DX: Family history of other specified conditions: Z84.89

## 2017-03-31 LAB — COMPREHENSIVE METABOLIC PANEL
ALBUMIN: 3.9 g/dL (ref 3.5–5.0)
ALK PHOS: 43 U/L (ref 38–126)
ALT: 15 U/L (ref 14–54)
AST: 20 U/L (ref 15–41)
Anion gap: 3 — ABNORMAL LOW (ref 5–15)
BUN: 13 mg/dL (ref 6–20)
CALCIUM: 9.1 mg/dL (ref 8.9–10.3)
CHLORIDE: 105 mmol/L (ref 101–111)
CO2: 31 mmol/L (ref 22–32)
CREATININE: 0.59 mg/dL (ref 0.44–1.00)
GFR calc non Af Amer: >60 mL/min (ref >60)
GLUCOSE: 101 mg/dL — AB (ref 65–99)
Potassium: 4 mmol/L (ref 3.5–5.1)
SODIUM: 139 mmol/L (ref 135–145)
Total Bilirubin: 0.5 mg/dL (ref 0.3–1.2)
Total Protein: 7.2 g/dL (ref 6.5–8.1)

## 2017-03-31 LAB — CBC
HCT: 35.8 % — ABNORMAL LOW (ref 36.0–46.0)
HEMOGLOBIN: 11.9 g/dL — AB (ref 12.0–15.0)
MCH: 30.4 pg (ref 26.0–34.0)
MCHC: 33.2 g/dL (ref 30.0–36.0)
MCV: 91.3 fL (ref 78.0–100.0)
PLATELETS: 301 10*3/uL (ref 150–400)
RBC: 3.92 MIL/uL (ref 3.87–5.11)
RDW: 15.1 % (ref 11.5–15.5)
WBC: 7.6 10*3/uL (ref 4.0–10.5)

## 2017-03-31 LAB — HCG, SERUM, QUALITATIVE: Preg, Serum: NEGATIVE

## 2017-03-31 LAB — ABO/RH: ABO/RH(D): A NEG

## 2017-04-01 NOTE — Anesthesia Preprocedure Evaluation (Signed)
Anesthesia Evaluation  Patient identified by MRN, date of birth, ID band Patient awake    Reviewed: Allergy & Precautions, NPO status , Patient's Chart, lab work & pertinent test results  Airway Mallampati: II  TM Distance: >3 FB Neck ROM: Full    Dental no notable dental hx.    Pulmonary neg pulmonary ROS,    Pulmonary exam normal breath sounds clear to auscultation       Cardiovascular negative cardio ROS Normal cardiovascular exam Rhythm:Regular Rate:Normal     Neuro/Psych negative neurological ROS  negative psych ROS   GI/Hepatic negative GI ROS, Neg liver ROS,   Endo/Other  Hypothyroidism   Renal/GU negative Renal ROS  negative genitourinary   Musculoskeletal  (+) Arthritis , Rheumatoid disorders,    Abdominal   Peds negative pediatric ROS (+)  Hematology negative hematology ROS (+)   Anesthesia Other Findings   Reproductive/Obstetrics negative OB ROS                             Anesthesia Physical Anesthesia Plan  ASA: II  Anesthesia Plan: General   Post-op Pain Management:    Induction: Intravenous  PONV Risk Score and Plan: 3 and Ondansetron, Dexamethasone, Propofol, Midazolam and Treatment may vary due to age  Airway Management Planned: Oral ETT  Additional Equipment:   Intra-op Plan:   Post-operative Plan: Extubation in OR  Informed Consent: I have reviewed the patients History and Physical, chart, labs and discussed the procedure including the risks, benefits and alternatives for the proposed anesthesia with the patient or authorized representative who has indicated his/her understanding and acceptance.   Dental advisory given  Plan Discussed with: CRNA and Surgeon  Anesthesia Plan Comments:         Anesthesia Quick Evaluation

## 2017-04-03 ENCOUNTER — Inpatient Hospital Stay (HOSPITAL_COMMUNITY)
Admission: RE | Admit: 2017-04-03 | Discharge: 2017-04-05 | DRG: 743 | Disposition: A | Discharge status: Home / Self Care | Payer: BC Managed Care – PPO | Source: Ambulatory Visit | Attending: Obstetrics and Gynecology | Admitting: Obstetrics and Gynecology

## 2017-04-03 ENCOUNTER — Encounter (HOSPITAL_COMMUNITY): Payer: Self-pay | Admitting: *Deleted

## 2017-04-03 ENCOUNTER — Inpatient Hospital Stay (HOSPITAL_COMMUNITY): Payer: BC Managed Care – PPO | Admitting: Anesthesiology

## 2017-04-03 ENCOUNTER — Encounter (HOSPITAL_COMMUNITY)
Admission: RE | Disposition: A | Discharge status: Home / Self Care | Payer: Self-pay | Source: Ambulatory Visit | Attending: Obstetrics and Gynecology

## 2017-04-03 DIAGNOSIS — R102 Pelvic and perineal pain: Secondary | ICD-10-CM | POA: Diagnosis present

## 2017-04-03 DIAGNOSIS — M069 Rheumatoid arthritis, unspecified: Secondary | ICD-10-CM | POA: Diagnosis present

## 2017-04-03 DIAGNOSIS — E039 Hypothyroidism, unspecified: Secondary | ICD-10-CM | POA: Diagnosis present

## 2017-04-03 DIAGNOSIS — Z79899 Other long term (current) drug therapy: Secondary | ICD-10-CM | POA: Diagnosis not present

## 2017-04-03 DIAGNOSIS — F419 Anxiety disorder, unspecified: Secondary | ICD-10-CM | POA: Diagnosis present

## 2017-04-03 DIAGNOSIS — N83202 Unspecified ovarian cyst, left side: Secondary | ICD-10-CM | POA: Diagnosis present

## 2017-04-03 DIAGNOSIS — D259 Leiomyoma of uterus, unspecified: Principal | ICD-10-CM | POA: Diagnosis present

## 2017-04-03 DIAGNOSIS — Z90710 Acquired absence of both cervix and uterus: Secondary | ICD-10-CM | POA: Diagnosis present

## 2017-04-03 HISTORY — PX: SALPINGOOPHORECTOMY: SHX82

## 2017-04-03 HISTORY — PX: ABDOMINAL HYSTERECTOMY: SHX81

## 2017-04-03 LAB — TYPE AND SCREEN
ABO/RH(D): A NEG
ANTIBODY SCREEN: NEGATIVE

## 2017-04-03 SURGERY — HYSTERECTOMY, ABDOMINAL
Anesthesia: General | Site: Perineum

## 2017-04-03 MED ORDER — FENTANYL CITRATE (PF) 100 MCG/2ML IJ SOLN
INTRAMUSCULAR | Status: AC
Start: 2017-04-03 — End: 2017-04-03
  Filled 2017-04-03: qty 2

## 2017-04-03 MED ORDER — EPHEDRINE 5 MG/ML INJ
INTRAVENOUS | Status: AC
  Filled 2017-04-03: qty 10

## 2017-04-03 MED ORDER — ROCURONIUM BROMIDE 50 MG/5ML IV SOSY
PREFILLED_SYRINGE | INTRAVENOUS | Status: AC
  Filled 2017-04-03: qty 5

## 2017-04-03 MED ORDER — ALUM & MAG HYDROXIDE-SIMETH 200-200-20 MG/5ML PO SUSP
30.0000 mL | ORAL | Status: DC | PRN
  Administered 2017-04-04: 30 mL via ORAL
  Filled 2017-04-03: qty 30

## 2017-04-03 MED ORDER — ALBUTEROL SULFATE (2.5 MG/3ML) 0.083% IN NEBU: 2.5000 mg | INHALATION_SOLUTION | Freq: Four times a day (QID) | RESPIRATORY_TRACT | Status: DC | PRN

## 2017-04-03 MED ORDER — 0.9 % SODIUM CHLORIDE (POUR BTL) OPTIME
TOPICAL | Status: DC | PRN
  Administered 2017-04-03: 2000 mL

## 2017-04-03 MED ORDER — HYDROMORPHONE 1 MG/ML IV SOLN
INTRAVENOUS | Status: DC
  Administered 2017-04-03: 25 mg via INTRAVENOUS
  Administered 2017-04-03: 3.6 mg via INTRAVENOUS
  Filled 2017-04-03: qty 25

## 2017-04-03 MED ORDER — PROPRANOLOL HCL ER 60 MG PO CP24
60.0000 mg | ORAL_CAPSULE | Freq: Every day | ORAL | Status: DC
  Filled 2017-04-03 (×3): qty 1

## 2017-04-03 MED ORDER — FENTANYL CITRATE (PF) 250 MCG/5ML IJ SOLN
INTRAMUSCULAR | Status: AC
  Filled 2017-04-03: qty 5

## 2017-04-03 MED ORDER — LIDOCAINE HCL (CARDIAC) 20 MG/ML IV SOLN
INTRAVENOUS | Status: DC | PRN
  Administered 2017-04-03: 50 mg via INTRAVENOUS

## 2017-04-03 MED ORDER — LACTATED RINGERS IV SOLN
INTRAVENOUS | Status: DC | PRN
  Administered 2017-04-03 (×2): via INTRAVENOUS

## 2017-04-03 MED ORDER — OXYCODONE-ACETAMINOPHEN 5-325 MG PO TABS
1.0000 | ORAL_TABLET | ORAL | Status: DC | PRN
  Administered 2017-04-03: 20:00:00 1 via ORAL
  Administered 2017-04-04 – 2017-04-05 (×6): 2 via ORAL
  Filled 2017-04-03: qty 1
  Filled 2017-04-03 (×6): qty 2

## 2017-04-03 MED ORDER — HYDROMORPHONE HCL 1 MG/ML IJ SOLN
0.2000 mg | INTRAMUSCULAR | Status: DC | PRN
  Administered 2017-04-03: 0.5 mg via INTRAVENOUS
  Filled 2017-04-03: qty 0.5

## 2017-04-03 MED ORDER — PROPRANOLOL HCL ER 60 MG PO CP24
60.0000 mg | ORAL_CAPSULE | Freq: Every day | ORAL | Status: DC
  Administered 2017-04-03: 60 mg via ORAL
  Filled 2017-04-03: qty 1

## 2017-04-03 MED ORDER — SODIUM CHLORIDE 0.9% FLUSH: 9.0000 mL | INTRAVENOUS | Status: DC | PRN

## 2017-04-03 MED ORDER — FENTANYL CITRATE (PF) 100 MCG/2ML IJ SOLN
INTRAMUSCULAR | Status: AC
  Filled 2017-04-03: qty 2

## 2017-04-03 MED ORDER — HYDROMORPHONE HCL 1 MG/ML IJ SOLN
0.2500 mg | INTRAMUSCULAR | Status: DC | PRN
  Administered 2017-04-03 (×4): 0.5 mg via INTRAVENOUS

## 2017-04-03 MED ORDER — PROMETHAZINE HCL 25 MG/ML IJ SOLN: 6.2500 mg | INTRAMUSCULAR | Status: DC | PRN

## 2017-04-03 MED ORDER — MIDAZOLAM HCL 2 MG/2ML IJ SOLN
INTRAMUSCULAR | Status: AC
  Filled 2017-04-03: qty 2

## 2017-04-03 MED ORDER — ROCURONIUM BROMIDE 100 MG/10ML IV SOLN
INTRAVENOUS | Status: DC | PRN
  Administered 2017-04-03: 50 mg via INTRAVENOUS
  Administered 2017-04-03: 10 mg via INTRAVENOUS

## 2017-04-03 MED ORDER — ACETAMINOPHEN 10 MG/ML IV SOLN
INTRAVENOUS | Status: AC
  Filled 2017-04-03: qty 100

## 2017-04-03 MED ORDER — EPHEDRINE SULFATE 50 MG/ML IJ SOLN
INTRAMUSCULAR | Status: DC | PRN
  Administered 2017-04-03: 10 mg via INTRAVENOUS

## 2017-04-03 MED ORDER — CITALOPRAM HYDROBROMIDE 20 MG PO TABS
10.0000 mg | ORAL_TABLET | Freq: Every evening | ORAL | Status: DC
  Administered 2017-04-03 – 2017-04-04 (×2): 10 mg via ORAL
  Filled 2017-04-03 (×2): qty 1

## 2017-04-03 MED ORDER — ONDANSETRON HCL 4 MG/2ML IJ SOLN
INTRAMUSCULAR | Status: AC
  Filled 2017-04-03: qty 2

## 2017-04-03 MED ORDER — ONDANSETRON HCL 4 MG/2ML IJ SOLN
4.0000 mg | Freq: Four times a day (QID) | INTRAMUSCULAR | Status: DC | PRN
  Administered 2017-04-03: 4 mg via INTRAVENOUS
  Filled 2017-04-03: qty 2

## 2017-04-03 MED ORDER — DEXAMETHASONE SODIUM PHOSPHATE 10 MG/ML IJ SOLN
INTRAMUSCULAR | Status: AC
  Filled 2017-04-03: qty 1

## 2017-04-03 MED ORDER — IBUPROFEN 400 MG PO TABS
600.0000 mg | ORAL_TABLET | Freq: Four times a day (QID) | ORAL | Status: DC | PRN
  Administered 2017-04-04: 02:00:00 600 mg via ORAL
  Filled 2017-04-03: qty 1

## 2017-04-03 MED ORDER — SUGAMMADEX SODIUM 200 MG/2ML IV SOLN
INTRAVENOUS | Status: DC | PRN
  Administered 2017-04-03: 100 mg via INTRAVENOUS

## 2017-04-03 MED ORDER — PROPOFOL 10 MG/ML IV BOLUS
INTRAVENOUS | Status: DC | PRN
  Administered 2017-04-03: 150 mg via INTRAVENOUS

## 2017-04-03 MED ORDER — MENTHOL 3 MG MT LOZG: 1.0000 | LOZENGE | OROMUCOSAL | Status: DC | PRN

## 2017-04-03 MED ORDER — AZELASTINE HCL 0.1 % NA SOLN
1.0000 | Freq: Two times a day (BID) | NASAL | Status: DC | PRN
  Filled 2017-04-03: qty 30

## 2017-04-03 MED ORDER — PROPOFOL 10 MG/ML IV BOLUS
INTRAVENOUS | Status: AC
  Filled 2017-04-03: qty 40

## 2017-04-03 MED ORDER — ONDANSETRON HCL 4 MG/2ML IJ SOLN
INTRAMUSCULAR | Status: DC | PRN
  Administered 2017-04-03: 4 mg via INTRAVENOUS

## 2017-04-03 MED ORDER — HYDROMORPHONE HCL 1 MG/ML IJ SOLN
INTRAMUSCULAR | Status: AC
  Administered 2017-04-03: 0.5 mg via INTRAVENOUS
  Filled 2017-04-03: qty 2

## 2017-04-03 MED ORDER — FENTANYL CITRATE (PF) 100 MCG/2ML IJ SOLN
INTRAMUSCULAR | Status: DC | PRN
  Administered 2017-04-03 (×4): 50 ug via INTRAVENOUS
  Administered 2017-04-03 (×2): 100 ug via INTRAVENOUS
  Administered 2017-04-03: 50 ug via INTRAVENOUS

## 2017-04-03 MED ORDER — LEVOTHYROXINE SODIUM 100 MCG PO TABS
100.0000 ug | ORAL_TABLET | Freq: Every day | ORAL | Status: DC
Start: 2017-04-04 — End: 2017-04-05
  Filled 2017-04-03 (×2): qty 1

## 2017-04-03 MED ORDER — CEFOTETAN DISODIUM-DEXTROSE 2-2.08 GM-% IV SOLR
2.0000 g | INTRAVENOUS | Status: AC
  Administered 2017-04-03: 2 g via INTRAVENOUS

## 2017-04-03 MED ORDER — CEFOTETAN DISODIUM-DEXTROSE 2-2.08 GM-% IV SOLR
INTRAVENOUS | Status: AC
  Filled 2017-04-03: qty 50

## 2017-04-03 MED ORDER — NALOXONE HCL 0.4 MG/ML IJ SOLN: 0.4000 mg | INTRAMUSCULAR | Status: DC | PRN

## 2017-04-03 MED ORDER — LIDOCAINE 2% (20 MG/ML) 5 ML SYRINGE
INTRAMUSCULAR | Status: AC
  Filled 2017-04-03: qty 5

## 2017-04-03 MED ORDER — DIPHENHYDRAMINE HCL 12.5 MG/5ML PO ELIX: 12.5000 mg | ORAL_SOLUTION | Freq: Four times a day (QID) | ORAL | Status: DC | PRN

## 2017-04-03 MED ORDER — MIDAZOLAM HCL 5 MG/5ML IJ SOLN
INTRAMUSCULAR | Status: DC | PRN
  Administered 2017-04-03: 2 mg via INTRAVENOUS

## 2017-04-03 MED ORDER — DIPHENHYDRAMINE HCL 50 MG/ML IJ SOLN: 12.5000 mg | Freq: Four times a day (QID) | INTRAMUSCULAR | Status: DC | PRN

## 2017-04-03 MED ORDER — ACETAMINOPHEN 10 MG/ML IV SOLN
1000.0000 mg | Freq: Once | INTRAVENOUS | Status: AC
  Administered 2017-04-03: 1000 mg via INTRAVENOUS

## 2017-04-03 MED ORDER — DEXAMETHASONE SODIUM PHOSPHATE 4 MG/ML IJ SOLN
INTRAMUSCULAR | Status: DC | PRN
  Administered 2017-04-03: 10 mg via INTRAVENOUS

## 2017-04-03 MED ORDER — SIMETHICONE 80 MG PO CHEW
80.0000 mg | CHEWABLE_TABLET | Freq: Four times a day (QID) | ORAL | Status: DC | PRN
  Administered 2017-04-04: 02:00:00 80 mg via ORAL
  Filled 2017-04-03: qty 1

## 2017-04-03 MED ORDER — DEXTROSE-NACL 5-0.45 % IV SOLN
INTRAVENOUS | Status: DC
  Administered 2017-04-03 – 2017-04-04 (×3): via INTRAVENOUS

## 2017-04-03 SURGICAL SUPPLY — 29 items
CHLORAPREP W/TINT 26ML (MISCELLANEOUS) ×4 IMPLANT
CONT SPEC 4OZ CLIKSEAL STRL BL (MISCELLANEOUS) ×4 IMPLANT
COVER SURGICAL LIGHT HANDLE (MISCELLANEOUS) ×4 IMPLANT
DRAPE SLUSH/WARMER DISC (DRAPES) ×4 IMPLANT
DRAPE WARM FLUID 44X44 (DRAPE) ×4 IMPLANT
DRSG OPSITE POSTOP 4X8 (GAUZE/BANDAGES/DRESSINGS) ×4 IMPLANT
ELECT REM PT RETURN 15FT ADLT (MISCELLANEOUS) ×4 IMPLANT
GAUZE SPONGE 4X4 16PLY XRAY LF (GAUZE/BANDAGES/DRESSINGS) IMPLANT
GLOVE BIO SURGEON STRL SZ 6 (GLOVE) ×4 IMPLANT
GLOVE BIOGEL PI IND STRL 6 (GLOVE) ×2 IMPLANT
GLOVE BIOGEL PI IND STRL 6.5 (GLOVE) ×4 IMPLANT
GLOVE BIOGEL PI IND STRL 7.5 (GLOVE) ×4 IMPLANT
GLOVE BIOGEL PI INDICATOR 6 (GLOVE) ×2
GLOVE BIOGEL PI INDICATOR 6.5 (GLOVE) ×4
GLOVE BIOGEL PI INDICATOR 7.5 (GLOVE) ×4
GLOVE ECLIPSE 8.0 STRL XLNG CF (GLOVE) ×8 IMPLANT
GOWN STRL REUS W/TWL LRG LVL3 (GOWN DISPOSABLE) ×4 IMPLANT
GOWN STRL REUS W/TWL XL LVL3 (GOWN DISPOSABLE) ×8 IMPLANT
HEMOSTAT ARISTA ABSORB 3G PWDR (MISCELLANEOUS) ×4 IMPLANT
LIGASURE IMPACT 36 18CM CVD LR (INSTRUMENTS) ×4 IMPLANT
SOL PREP PROV IODINE SCRUB 4OZ (MISCELLANEOUS) ×4 IMPLANT
SPONGE LAP 18X18 X RAY DECT (DISPOSABLE) ×4 IMPLANT
SUT MNCRL AB 4-0 PS2 18 (SUTURE) ×4 IMPLANT
SUT VIC AB 0 CT1 18XCR BRD 8 (SUTURE) ×4 IMPLANT
SUT VIC AB 0 CT1 8-18 (SUTURE) ×4
SUT VIC AB 0 CT2 27 (SUTURE) ×8 IMPLANT
SUT VICRYL 0 TIES 12 18 (SUTURE) ×4 IMPLANT
TOWEL OR 17X26 10 PK STRL BLUE (TOWEL DISPOSABLE) ×4 IMPLANT
TRAY FOLEY CATH SILVER 14FR (SET/KITS/TRAYS/PACK) ×4 IMPLANT

## 2017-04-03 NOTE — Progress Notes (Signed)
Wasted 19 mls of Dilaudid PCA with Satira Anis, RN.

## 2017-04-03 NOTE — Anesthesia Postprocedure Evaluation (Signed)
Anesthesia Post Note  Patient: Laurie Cherry  Procedure(s) Performed: Procedure(s) (LRB): TOTAL HYSTERECTOMY ABDOMINAL (N/A) SALPINGO OOPHORECTOMY (Bilateral)     Patient location during evaluation: PACU Anesthesia Type: General Level of consciousness: awake and alert Pain management: pain level controlled Vital Signs Assessment: post-procedure vital signs reviewed and stable Respiratory status: spontaneous breathing, nonlabored ventilation, respiratory function stable and patient connected to nasal cannula oxygen Cardiovascular status: blood pressure returned to baseline and stable Postop Assessment: no signs of nausea or vomiting Anesthetic complications: no    Last Vitals:  Vitals:   04/03/17 1030 04/03/17 1100  BP: 122/80 128/72  Pulse: 70 74  Resp: 12 14  Temp: 36.5 C 36.5 C    Last Pain:  Vitals:   04/03/17 1105  TempSrc:   PainSc: 5                  Emillie Chasen S

## 2017-04-03 NOTE — Anesthesia Procedure Notes (Signed)
Procedure Name: Intubation Date/Time: 04/03/2017 7:41 AM Performed by: Claudia Desanctis Pre-anesthesia Checklist: Patient identified, Emergency Drugs available, Suction available and Patient being monitored Patient Re-evaluated:Patient Re-evaluated prior to inductionOxygen Delivery Method: Circle system utilized Preoxygenation: Pre-oxygenation with 100% oxygen Intubation Type: IV induction Ventilation: Mask ventilation without difficulty Laryngoscope Size: 2 and Miller Grade View: Grade I Tube type: Oral Tube size: 7.0 mm Number of attempts: 1 Airway Equipment and Method: Stylet Placement Confirmation: ETT inserted through vocal cords under direct vision,  positive ETCO2 and breath sounds checked- equal and bilateral Secured at: 22 cm Tube secured with: Tape Dental Injury: Teeth and Oropharynx as per pre-operative assessment

## 2017-04-03 NOTE — Transfer of Care (Signed)
Immediate Anesthesia Transfer of Care Note  Patient: Laurie Cherry  Procedure(s) Performed: Procedure(s): TOTAL HYSTERECTOMY ABDOMINAL (N/A) SALPINGO OOPHORECTOMY (Bilateral)  Patient Location: PACU  Anesthesia Type:General  Level of Consciousness:  sedated, patient cooperative and responds to stimulation  Airway & Oxygen Therapy:Patient Spontanous Breathing and Patient connected to face mask oxgen  Post-op Assessment:  Report given to PACU RN and Post -op Vital signs reviewed and stable  Post vital signs:  Reviewed and stable  Last Vitals:  Vitals:   04/03/17 0558 04/03/17 0924  BP: (!) 142/68   Pulse: 65   Resp: 16   Temp: 36.8 C (P) 97.2 C    Complications: No apparent anesthesia complications

## 2017-04-04 ENCOUNTER — Encounter (HOSPITAL_COMMUNITY): Payer: Self-pay | Admitting: Obstetrics and Gynecology

## 2017-04-04 LAB — CBC
HCT: 32.6 % — ABNORMAL LOW (ref 36.0–46.0)
Hemoglobin: 10.7 g/dL — ABNORMAL LOW (ref 12.0–15.0)
MCH: 30.4 pg (ref 26.0–34.0)
MCHC: 32.8 g/dL (ref 30.0–36.0)
MCV: 92.6 fL (ref 78.0–100.0)
PLATELETS: 291 10*3/uL (ref 150–400)
RBC: 3.52 MIL/uL — ABNORMAL LOW (ref 3.87–5.11)
RDW: 15.2 % (ref 11.5–15.5)
WBC: 16.1 10*3/uL — AB (ref 4.0–10.5)

## 2017-04-04 NOTE — Progress Notes (Signed)
1 Day Post-Op Procedure(s) (LRB): TOTAL HYSTERECTOMY ABDOMINAL (N/A) SALPINGO OOPHORECTOMY (Bilateral)  Subjective: Patient reports tolerating PO, + flatus and no problems voiding.    Objective: I have reviewed patient's vital signs, intake and output and medications.  GI: soft, non-tender; bowel sounds normal; no masses,  no organomegaly  Assessment: s/p Procedure(s): TOTAL HYSTERECTOMY ABDOMINAL (N/A) SALPINGO OOPHORECTOMY (Bilateral): stable and progressing well  Plan: Doing well.  Plan D/C in am with fu in 2 weeks in office  LOS: 1 day    Brenin Heidelberger C 04/04/2017, 7:23 PM

## 2017-04-04 NOTE — Op Note (Signed)
NAME:  NGUYEN, BUTLER NO.:  [id number]  MEDICAL RECORD NO.:  [id number]  LOCATION:  [facility]                         FACILITY:  [facility]  PHYSICIAN:  Monia Sabal. Corinna Capra, M.D.    DATE OF BIRTH:  August 31, 1961  DATE OF PROCEDURE:  04/03/2017 DATE OF DISCHARGE:                              OPERATIVE REPORT   PREOPERATIVE DIAGNOSES:  Pelvic pain, left and right adnexal masses, fibroids, thickened endometrium.  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  PROCEDURE:  Total abdominal hysterectomy, bilateral salpingo- oophorectomy.  SURGEON:  Monia Sabal. Corinna Capra, M.D.  ASSISTANT:  Dr. Rosaland Lao.  ANESTHESIA:  General endotracheal.  INDICATIONS:  Ms. Renier presented to me for evaluation of pelvic pain and adnexal masses.  Ultrasound showed a large left adnexal mass in the left adnexa measuring 9.5 cm suspicious for endometrioma.  She had normal CA-125 and CEA, and it was most suspicious for benign tumor.  She also has fibroids, a thickened endometrium with a normal endometrial biopsy, presents for definitive surgical evaluation and treatment with a hysterectomy planned and potential staging procedures that appear to be malignant or premalignant.  Risks and benefits were discussed at length. Informed consent was obtained.  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 Slightly enlarged uterus consistent with fibroids, pelvic adhesions, and normal-appearing appendix.  DESCRIPTION OF PROCEDURE:  After adequate anesthesia, the patient was placed in supine position.  She was sterilely prepped and draped. Bladder was sterilely drained per Foley catheter.  A Pfannenstiel skin incision was made 2 fingerbreadths above the pubic symphysis, taken down sharply to the fascia which was incised transversely,  extended superiorly and inferiorly off the bellies of the rectus muscles, which were separated sharply in the midline.  The peritoneum was entered sharply.  The cytology from pelvic washings was obtained with Fredia Sorrow retractor was used.  The bowel was packed cephalad.  The adnexal mass on the left side was easily elevated, dissected off the bowel and pelvic sidewall.  I did rupture upon retrieving this, and it was consistent with a mucusy yellow fluid consistent with a dermoid, and small little fragments of what appeared to be bone inside.  The ovary was removed.  Copious amount of irrigation was then achieved after dissecting the right ovary from the pelvic sidewall.  The right and left broad ligaments were identified.  The round ligament was ligated with LigaSure instrument.  The broad ligament was opened, and the infundibulopelvic ligaments were ligated bilaterally.  The bladder was dissected off the anterior surface of the cervix, and the inferior portion of the broad ligaments were dissected using the LigaSure instrument down to the uterine artery, was further dissected down the cardinal ligaments down to the uterosacral ligament.  Heaney clamps were used to clamp bilaterally and entered the vagina with dissection of the uterus and __________ with the uterus.  The vagina was then identified and angle sutures were placed, and the vagina was closed in a horizontal fashion using figure-of-eights of 0 Vicryl suture.  A McCall culdoplasty was then performed.  Examination  of the pedicles revealed good hemostasis.  Good peristalsis was noted at the ureters.  Copious amount of irrigation was obtained, some subtle areas of small bleeding due to the previous adhesions.  Arista was inserted to achieve good hemostasis. After hemostasis had been achieved, packing was removed.  Peritoneum was then closed with a 2-0 Vicryl.  Rectus muscle plicated in the midline. The fascia was then  closed with 0 Vicryl suture in a running fashion. The skin was then closed with 4-0 Monocryl with good approximation, good hemostasis.  The patient tolerated the procedure well, was stable on transfer to the recovery room.  Sponge and instrument count were normal x3.  Estimated blood loss, 250 mL.  The patient received 2 g of cefotetan preoperatively.     Monia Sabal Corinna Capra, M.D.     DCL/MEDQ  D:  04/03/2017  T:  04/03/2017  Job:  [id number]

## 2017-04-04 NOTE — Progress Notes (Signed)
1 Day Post-Op Procedure(s) (LRB): TOTAL HYSTERECTOMY ABDOMINAL (N/A) SALPINGO OOPHORECTOMY (Bilateral)  Subjective: Patient reports tolerating PO, + flatus and no problems voiding.    Objective: I have reviewed patient's vital signs, intake and output, medications and labs.  General: alert, cooperative, appears stated age and no distress GI: soft, non-tender; bowel sounds normal; no masses,  no organomegaly Vaginal Bleeding: none  Assessment: s/p Procedure(s): TOTAL HYSTERECTOMY ABDOMINAL (N/A) SALPINGO OOPHORECTOMY (Bilateral): stable and progressing well  Plan: Advance diet Encourage ambulation Advance to PO medication Discontinue IV fluids  LOS: 1 day    Kobie Whidby C 04/04/2017, 8:27 AM

## 2017-04-05 MED ORDER — OXYCODONE-ACETAMINOPHEN 5-325 MG PO TABS: 1.0000 | ORAL_TABLET | ORAL | 0 refills | Status: DC | PRN

## 2017-04-05 MED ORDER — IBUPROFEN 600 MG PO TABS: 600.0000 mg | ORAL_TABLET | Freq: Four times a day (QID) | ORAL | 0 refills | Status: DC | PRN

## 2017-04-05 NOTE — Progress Notes (Signed)
Discharged from floor via w/c for transport home by car. Belongings & spouse with pt. No changes in assessment. Laurie Cherry  

## 2017-04-05 NOTE — Discharge Summary (Signed)
Admission Diagnosis: Pelvic Pain,  Fibroids,  Thickened Endometrium Adnexal Mass  Discharge Diagnosis: Same  Hospital Course: 56 year old female admitted for TAH and BSO. She did very well postop . By POD # 2 she was ambulating, voiding, and eating breakfast.  BP 112/62 (BP Location: Left Arm)   Pulse 75   Temp 98.5 F (36.9 C) (Oral)   Resp 16   Ht 5\' 2"  (1.575 m)   Wt 62.6 kg (138 lb)   LMP 03/31/2017 Comment: scant spotting x 3 days  SpO2 97%   BMI 25.24 kg/m  Abdomen is soft and non tender Incision is clean and dry  No results found for this or any previous visit (from the past 24 hour(s)).  Patient will be discharged home in good condition Follow up with Dr. Corinna Capra in 2 weeks Rx Ibuprofen and percocet given to patient

## 2017-04-20 ENCOUNTER — Other Ambulatory Visit: Payer: Self-pay | Admitting: Neurology

## 2017-04-20 DIAGNOSIS — G43009 Migraine without aura, not intractable, without status migrainosus: Secondary | ICD-10-CM

## 2017-05-01 ENCOUNTER — Encounter (INDEPENDENT_AMBULATORY_CARE_PROVIDER_SITE_OTHER): Payer: Self-pay | Admitting: *Deleted

## 2017-05-28 ENCOUNTER — Other Ambulatory Visit: Payer: Self-pay | Admitting: Neurology

## 2017-05-28 DIAGNOSIS — G43009 Migraine without aura, not intractable, without status migrainosus: Secondary | ICD-10-CM

## 2017-07-15 ENCOUNTER — Ambulatory Visit: Payer: BC Managed Care – PPO | Admitting: Neurology

## 2017-07-15 ENCOUNTER — Ambulatory Visit: Payer: BC Managed Care – PPO | Admitting: "Endocrinology

## 2017-07-17 ENCOUNTER — Encounter: Payer: Self-pay | Admitting: Neurology

## 2017-07-17 ENCOUNTER — Ambulatory Visit (INDEPENDENT_AMBULATORY_CARE_PROVIDER_SITE_OTHER): Payer: BC Managed Care – PPO | Admitting: Neurology

## 2017-07-17 VITALS — BP 126/70 | HR 71 | Ht 62.0 in | Wt 141.4 lb

## 2017-07-17 DIAGNOSIS — G43009 Migraine without aura, not intractable, without status migrainosus: Secondary | ICD-10-CM | POA: Diagnosis not present

## 2017-07-17 NOTE — Progress Notes (Signed)
NEUROLOGY FOLLOW UP OFFICE NOTE  Laurie Cherry [id number]  HISTORY OF PRESENT ILLNESS: Laurie Cherry is a 56 year old right-handed female with rheumatoid arthritis, hypothyroidism, reflux, and mild depression who follows up for chronic migraine.   UPDATE: Headaches have picked up since May.  She had a hysterectomy in June. Intensity: 5-6/10; May: 5-6/10 Duration:  20-30 minutes; May: 20-30 minutes Frequency:  10-16 days per month; May: 6 out of 21 days Current NSAIDS:  Advil (for arthritis), naproxen (infrequent) Current analgesics:  none Current triptans:  sumatriptan 50mg  (100mg  caused drowsiness) (w/wo naproxen 500mg ) Current anti-emetic:  none Current muscle relaxants:  none Current anti-anxiolytic:  none Current sleep aide:  none Current Antihypertensive medications:  propranolol ER 60mg  Current Antidepressant medications:  Celexa Current Anticonvulsant medications:  none   Caffeine:  1 cup of coffee daily Alcohol:  no Smoker:  no Diet:  1/2 can of soda daily.  Does not drink enough water Exercise:  no Depression/stress:  no Sleep hygiene:  varies   HISTORY: She has a history of "hormonal" migraines most of her life, described as pounding right temporal pain and often associated with her menstrual cycle.  They have pretty much stabilized but she will still get it every now and then.     She was diagnosed with rheumatoid arthritis in 2014.  She had been on methotrexate for about 2 years (stopped a month ago).  In September 2015, she began having new headaches.  It coincided with change in her thyroid medications.  They are left sided, 7/10 non-throbbing pain.    They are associated with photophobia, phonophobia, and osmophobia but not nausea.   They typically last 3 to 4 days and occur 1 to 2 times a week (however she also has a dull daily headache) Stress seems to be a trigger.  Change in thyroid medication may have been a trigger. These headaches do not correlate  with her menstrual cycle.  Sumatriptan with naproxen helps relieve it.   Past NSAIDS:  no Past analgesics:  Excedrin, Tylenol (ineffective) Past abortive triptans:  no Past muscle relaxants:  no Past anti-emetic:  no Past antihypertensive medications:  atenolol (25mg  ineffective and higher doses caused dizziness) Past antidepressant medications:  venlafaxine XR 150mg  Past anticonvulsant medications:  topiramate 25mg  (caused drowsiness, but willing to retry it if needed) Past vitamins/Herbal/Supplements:  no Past antihistamines/decongestants:  no Other past therapies:  no   Family history of headache:  Mom, sister Mother had glioblastoma  PAST MEDICAL HISTORY: Past Medical History:  Diagnosis Date  . Depression   . Esophagitis   . Family history of adverse reaction to anesthesia    Sister  . Fibroids   . GERD (gastroesophageal reflux disease)   . Hypothyroidism   . Rheumatoid arthritis ([place])   . Rheumatoid arthritis ([place]) 01/20/2017  . Worsening headaches     MEDICATIONS: Current Outpatient Prescriptions on File Prior to Visit  Medication Sig Dispense Refill  . Adalimumab (HUMIRA PEN) 40 MG/0.8ML PNKT Inject 40 mg into the skin every 14 (fourteen) days. Every other week on Sundays    . albuterol (PROVENTIL HFA;VENTOLIN HFA) 108 (90 Base) MCG/ACT inhaler Inhale 1-2 puffs into the lungs every 6 (six) hours as needed for wheezing or shortness of breath.    Marland Kitchen azelastine (ASTELIN) 0.1 % nasal spray Place 1-2 sprays into both nostrils 2 (two) times daily as needed for rhinitis. Use in each nostril as directed    . Cholecalciferol 5000 units TABS Take 5,000  Units by mouth daily with lunch.     . citalopram (CELEXA) 20 MG tablet Take 10 mg by mouth every evening.     . folic acid (FOLVITE) 1 MG tablet Take 1 mg by mouth daily with lunch.    . ibuprofen (ADVIL,MOTRIN) 600 MG tablet Take 1 tablet (600 mg total) by mouth every 6 (six) hours as needed (mild pain). 30 tablet 0  .  methotrexate (RHEUMATREX) 2.5 MG tablet Take 10 mg by mouth every Wednesday at 6 PM. Caution:Chemotherapy. Protect from light.     . naproxen (NAPROSYN) 500 MG tablet TAKE 1 TABLET(500 MG) BY MOUTH EVERY 12 HOURS AS NEEDED 10 tablet 1  . oxyCODONE-acetaminophen (PERCOCET/ROXICET) 5-325 MG tablet Take 1-2 tablets by mouth every 4 (four) hours as needed for severe pain (moderate to severe pain (when tolerating fluids)). (Patient not taking: Reported on 07/17/2017) 30 tablet 0  . propranolol ER (INDERAL LA) 60 MG 24 hr capsule TAKE 1 CAPSULE(60 MG) BY MOUTH DAILY (Patient taking differently: TAKE 1 CAPSULE(60 MG) BY MOUTH DAILY at 10 AM) 30 capsule 5  . SUMAtriptan (IMITREX) 100 MG tablet SEE NOTES (Patient taking differently: TAKE 1 TABLET (100 MG)  AT ONSET OF MIGRAINE HEADACHE AS NEEDED) 10 tablet 0  . SUMAtriptan (IMITREX) 100 MG tablet SEE NOTES 10 tablet 2  . Tetrahydrozoline-Zn Sulfate (ALLERGY RELIEF EYE DROPS OP) Place 1-2 drops into both eyes 3 (three) times daily as needed (FOR IRRITATED/ALLERGY EYES).    . TIROSINT 100 MCG CAPS Take 1 capsule (100 mcg total) by mouth daily before breakfast. 90 capsule 1  . Vitamin D, Ergocalciferol, (DRISDOL) 50000 units CAPS capsule Take 50,000 Units by mouth every Sunday. ~10AM     No current facility-administered medications on file prior to visit.     ALLERGIES: Allergies  Allergen Reactions  . Arava [Leflunomide] Hives  . Azithromycin Other (See Comments)    Severe bloating, abd pain Severe bloating, abd pain  . Cefuroxime Axetil Other (See Comments)    Increased bloating, abd pain  . Clarithromycin Other (See Comments)    abd pain abd pain  . Etanercept Other (See Comments) and Hives    Injection site irritation Injection site irritation  . Levofloxacin Other (See Comments) and Hives    Severe dizziness Severe dizziness  . Sulfa Antibiotics Hives    abd pain  . Methotrexate Derivatives Other (See Comments)    Injection ONLY (irritation  at the site of injection)    FAMILY HISTORY: Family History  Problem Relation Age of Onset  . Thyroid disease Mother   . Heart attack Mother   . Stroke Mother   . Diabetes Mother   . Brain cancer Mother   . Colon cancer Father   . Heart attack Sister   . Hypertension Sister   . Hypertension Brother     SOCIAL HISTORY: Social History   Social History  . Marital status: Married    Spouse name: N/A  . Number of children: N/A  . Years of education: N/A   Occupational History  . Not on file.   Social History Main Topics  . Smoking status: Never Smoker  . Smokeless tobacco: Never Used  . Alcohol use No  . Drug use: No  . Sexual activity: Not on file   Other Topics Concern  . Not on file   Social History Narrative  . No narrative on file    REVIEW OF SYSTEMS: Constitutional: No fevers, chills, or sweats, no generalized fatigue,  change in appetite Eyes: No visual changes, double vision, eye pain Ear, nose and throat: No hearing loss, ear pain, nasal congestion, sore throat Cardiovascular: No chest pain, palpitations Respiratory:  No shortness of breath at rest or with exertion, wheezes GastrointestinaI: No nausea, vomiting, diarrhea, abdominal pain, fecal incontinence Genitourinary:  No dysuria, urinary retention or frequency Musculoskeletal:  No neck pain, back pain Integumentary: No rash, pruritus, skin lesions Neurological: as above Psychiatric: No depression, insomnia, anxiety Endocrine: No palpitations, fatigue, diaphoresis, mood swings, change in appetite, change in weight, increased thirst Hematologic/Lymphatic:  No purpura, petechiae. Allergic/Immunologic: no itchy/runny eyes, nasal congestion, recent allergic reactions, rashes  PHYSICAL EXAM: Vitals:   07/17/17 0912  BP: 126/70  Pulse: 71  SpO2: 98%   General: No acute distress.  Patient appears well-groomed.  normal body habitus. Head:  Normocephalic/atraumatic Eyes:  Fundi examined but not  visualized Neck: supple, no paraspinal tenderness, full range of motion Heart:  Regular rate and rhythm Lungs:  Clear to auscultation bilaterally Back: No paraspinal tenderness Neurological Exam: alert and oriented to person, place, and time. Attention span and concentration intact, recent and remote memory intact, fund of knowledge intact.  Speech fluent and not dysarthric, language intact.  CN II-XII intact. Bulk and tone normal, muscle strength 5/5 throughout.  Sensation to light touch, temperature and vibration intact.  Deep tendon reflexes 2+ throughout, toes downgoing.  Finger to nose and heel to shin testing intact.  Gait normal, Romberg negative.  IMPRESSION: Migraine  PLAN: 1.  She will work on lifestyle modification:  Sleep hygiene, increase water intake, routine exercise 2.  We also discussed magnesium citrate, coenzyme Q10 and riboflavin 3.  We won't make changes to prescription medication at this time:  Propranolol ER 60mg  daily  Sumatriptan 100mg  with naproxen 500mg  for abortive therapy 4.  Follow up in 4 months.  Metta Clines, DO  CC:  Ephriam Jenkins, FNP

## 2017-07-17 NOTE — Patient Instructions (Signed)
Migraine Recommendations: 1.  Continue propranolol ER 60mg  daily. 2.  Take sumatriptan 100mg  with naproxen 500mg  at earliest onset of headache.  May repeat dose once in 2 hours if needed.  Do not exceed two doses in 24 hours. 3.  Limit use of pain relievers to no more than 2 days out of the week.  These medications include acetaminophen, ibuprofen, triptans and narcotics.  This will help reduce risk of rebound headaches. 4.  Be aware of common food triggers such as processed sweets, processed foods with nitrites (such as deli meat, hot dogs, sausages), foods with MSG, alcohol (such as wine), chocolate, certain cheeses, certain fruits (dried fruits, some citrus fruit), vinegar, diet soda. 4.  Avoid caffeine 5.  Routine exercise 6.  Proper sleep hygiene 7.  Stay adequately hydrated with water 8.  Keep a headache diary. 9.  Maintain proper stress management. 10.  Do not skip meals. 11.  Consider supplements:  Magnesium citrate 400mg  to 600mg  daily, riboflavin 400mg , Coenzyme Q 10 100mg  three times daily 12.  Follow up in 4 months.

## 2017-07-31 ENCOUNTER — Ambulatory Visit: Payer: BC Managed Care – PPO | Admitting: "Endocrinology

## 2017-08-01 ENCOUNTER — Other Ambulatory Visit: Payer: Self-pay | Admitting: "Endocrinology

## 2017-08-12 LAB — T4, FREE: Free T4: 1.75 ng/dL (ref 0.82–1.77)

## 2017-08-12 LAB — TSH: TSH: 0.894 u[IU]/mL (ref 0.450–4.500)

## 2017-08-14 ENCOUNTER — Encounter: Payer: Self-pay | Admitting: "Endocrinology

## 2017-08-14 ENCOUNTER — Ambulatory Visit (INDEPENDENT_AMBULATORY_CARE_PROVIDER_SITE_OTHER): Payer: BC Managed Care – PPO | Admitting: "Endocrinology

## 2017-08-14 VITALS — BP 130/82 | HR 71 | Ht 62.0 in | Wt 141.0 lb

## 2017-08-14 DIAGNOSIS — E038 Other specified hypothyroidism: Secondary | ICD-10-CM

## 2017-08-14 MED ORDER — TIROSINT 100 MCG PO CAPS: ORAL_CAPSULE | ORAL | 3 refills | Status: DC

## 2017-08-14 NOTE — Progress Notes (Signed)
Subjective:    Patient ID: Laurie Cherry, female    DOB: 1961-09-25, PCP Thea Alken   Past Medical History:  Diagnosis Date  . Depression   . Esophagitis   . Family history of adverse reaction to anesthesia    Sister  . Fibroids   . GERD (gastroesophageal reflux disease)   . Hypothyroidism   . Rheumatoid arthritis ([place])   . Rheumatoid arthritis ([place]) 01/20/2017  . Worsening headaches    Past Surgical History:  Procedure Laterality Date  . ABDOMINAL HYSTERECTOMY N/A 04/03/2017   Procedure: TOTAL HYSTERECTOMY ABDOMINAL;  Surgeon: Louretta Shorten, MD;  Location: [facility];  Service: Gynecology;  Laterality: N/A;  . EYE SURGERY Right   . SALPINGOOPHORECTOMY Bilateral 04/03/2017   Procedure: SALPINGO OOPHORECTOMY;  Surgeon: Louretta Shorten, MD;  Location: [facility];  Service: Gynecology;  Laterality: Bilateral;   Social History   Social History  . Marital status: Married    Spouse name: N/A  . Number of children: N/A  . Years of education: N/A   Social History Main Topics  . Smoking status: Never Smoker  . Smokeless tobacco: Never Used  . Alcohol use No  . Drug use: No  . Sexual activity: Not Asked   Other Topics Concern  . None   Social History Narrative  . None   Outpatient Encounter Prescriptions as of 08/14/2017  Medication Sig  . pantoprazole (PROTONIX) 40 MG tablet Take 40 mg by mouth daily.  . Adalimumab (HUMIRA PEN) 40 MG/0.8ML PNKT Inject 40 mg into the skin every 14 (fourteen) days. Every other week on Sundays  . albuterol (PROVENTIL HFA;VENTOLIN HFA) 108 (90 Base) MCG/ACT inhaler Inhale 1-2 puffs into the lungs every 6 (six) hours as needed for wheezing or shortness of breath.  Marland Kitchen azelastine (ASTELIN) 0.1 % nasal spray Place 1-2 sprays into both nostrils 2 (two) times daily as needed for rhinitis. Use in each nostril as directed  . Cholecalciferol 5000 units TABS Take 5,000 Units by mouth daily with lunch.   . citalopram (CELEXA) 20 MG tablet Take 10 mg by  mouth every evening.   Marland Kitchen estradiol (ESTRACE) 2 MG tablet Take 2 mg by mouth daily.  . folic acid (FOLVITE) 1 MG tablet Take 1 mg by mouth daily with lunch.  . ibuprofen (ADVIL,MOTRIN) 600 MG tablet Take 1 tablet (600 mg total) by mouth every 6 (six) hours as needed (mild pain).  . methotrexate (RHEUMATREX) 2.5 MG tablet Take 10 mg by mouth every Wednesday at 6 PM. Caution:Chemotherapy. Protect from light.   . naproxen (NAPROSYN) 500 MG tablet TAKE 1 TABLET(500 MG) BY MOUTH EVERY 12 HOURS AS NEEDED  . propranolol ER (INDERAL LA) 60 MG 24 hr capsule TAKE 1 CAPSULE(60 MG) BY MOUTH DAILY (Patient taking differently: TAKE 1 CAPSULE(60 MG) BY MOUTH DAILY at 10 AM)  . SUMAtriptan (IMITREX) 100 MG tablet SEE NOTES (Patient taking differently: TAKE 1 TABLET (100 MG)  AT ONSET OF MIGRAINE HEADACHE AS NEEDED)  . Tetrahydrozoline-Zn Sulfate (ALLERGY RELIEF EYE DROPS OP) Place 1-2 drops into both eyes 3 (three) times daily as needed (FOR IRRITATED/ALLERGY EYES).  . TIROSINT 100 MCG CAPS TAKE 1 CAPSULE(100 MCG) BY MOUTH DAILY BEFORE BREAKFAST  . Vitamin D, Ergocalciferol, (DRISDOL) 50000 units CAPS capsule Take 50,000 Units by mouth every Sunday. ~10AM  . [DISCONTINUED] oxyCODONE-acetaminophen (PERCOCET/ROXICET) 5-325 MG tablet Take 1-2 tablets by mouth every 4 (four) hours as needed for severe pain (moderate to severe pain (when tolerating fluids)). (Patient  not taking: Reported on 07/17/2017)  . [DISCONTINUED] SUMAtriptan (IMITREX) 100 MG tablet SEE NOTES  . [DISCONTINUED] TIROSINT 100 MCG CAPS TAKE 1 CAPSULE(100 MCG) BY MOUTH DAILY BEFORE BREAKFAST   No facility-administered encounter medications on file as of 08/14/2017.    ALLERGIES: Allergies  Allergen Reactions  . Arava [Leflunomide] Hives  . Azithromycin Other (See Comments)    Severe bloating, abd pain Severe bloating, abd pain  . Cefuroxime Axetil Other (See Comments)    Increased bloating, abd pain  . Clarithromycin Other (See Comments)    abd  pain abd pain  . Etanercept Other (See Comments) and Hives    Injection site irritation Injection site irritation  . Levofloxacin Other (See Comments) and Hives    Severe dizziness Severe dizziness  . Sulfa Antibiotics Hives    abd pain  . Methotrexate Derivatives Other (See Comments)    Injection ONLY (irritation at the site of injection)   VACCINATION STATUS:  There is no immunization history on file for this patient.  HPI  56 year old female patient with medical history as above. She is being seen in f/u for long-standing hypothyroidism.   She states that she was diagnosed with hypothyroidism approximately at age 26. Currently onTirosint 100 g by mouth every morning. This is due to her intolerance to regular levothyroxine, Synthroid. She denies new complaints today.   -She is compliant to her medications  She has family  history of thyroid dysfunction in multiple members of her family.  She denies any history of goiter. She denies any family history of thyroid cancer. - She underwent total abdominal hysterectomy/self-injurious oophorectomy due to a large ovarian cyst. Findings were reportedly benign.  -She is on multiple medications for her rheumatoid arthritis.   Review of Systems  Constitutional:  + steady  weight, - fatigue,  -subjective hypothermia Eyes: no blurry vision, no xerophthalmia ENT: no sore throat, no nodules palpated in throat, no dysphagia/odynophagia, no hoarseness Cardiovascular: no CP/SOB/palpitations/leg swelling Respiratory: no cough/SOB Gastrointestinal: no N/V/D/C Musculoskeletal: no muscle/joint aches Skin: no rashes Neurological: no tremors/numbness/tingling/dizziness Psychiatric: no depression/anxiety   Objective:    BP 130/82   Pulse 71   Ht 5\' 2"  (1.575 m)   Wt 141 lb (64 kg)   LMP 03/31/2017 Comment: scant spotting x 3 days  BMI 25.79 kg/m   Wt Readings from Last 3 Encounters:  08/14/17 141 lb (64 kg)  07/17/17 141 lb 6.4 oz  (64.1 kg)  04/03/17 138 lb (62.6 kg)    Physical Exam  Constitutional:  in NAD Eyes: PERRLA, EOMI, no exophthalmos ENT: moist mucous membranes, no thyromegaly, no cervical lymphadenopathy Cardiovascular: RRR, No MRG Respiratory: CTA B Gastrointestinal: abdomen soft, NT, ND, BS+ Musculoskeletal: no deformities, strength intact in all 4 Skin: moist, warm, no rashes Neurological: no tremor with outstretched hands, DTR normal in all 4  Recent Results (from the past 2160 hour(s))  T4, free     Status: None   Collection Time: 08/11/17  1:43 PM  Result Value Ref Range   Free T4 1.75 0.82 - 1.77 ng/dL  TSH     Status: None   Collection Time: 08/11/17  1:43 PM  Result Value Ref Range   TSH 0.894 0.450 - 4.500 uIU/mL    - On 08/16/2016 her free T4 was 1.36, improving from prior studies. - On 05/09/2016: Free T4 0.63, TSH 7.58, TPO antibodies 13  Assessment & Plan:   1.  hypothyroidism - Her thyroid function tests are consistent with appropriate replacement.  I will  continue Tirosint  100 g by mouth every morning.    - We discussed about correct intake of levothyroxine, at fasting, with water, separated by at least 30 minutes from breakfast, and separated by more than 4 hours from calcium, iron, multivitamins, acid reflux medications (PPIs). -Patient is made aware of the fact that thyroid hormone replacement is needed for life, dose to be adjusted by periodic monitoring of thyroid function tests.  -She has no clinical goiter, hence, no need for thyroid/neck ultrasound.   - I advised patient to maintain close follow up with Ephriam Jenkins E for primary care needs. Follow up plan: Return in about 1 year (around 08/14/2018) for follow up with pre-visit labs.  Glade Lloyd, MD Phone: [phone]  Fax: [phone]   08/14/2017, 12:12 PM

## 2017-10-05 ENCOUNTER — Other Ambulatory Visit: Payer: Self-pay | Admitting: Neurology

## 2017-10-05 DIAGNOSIS — G43009 Migraine without aura, not intractable, without status migrainosus: Secondary | ICD-10-CM

## 2017-11-06 ENCOUNTER — Other Ambulatory Visit: Payer: Self-pay | Admitting: Neurology

## 2017-11-06 DIAGNOSIS — G43009 Migraine without aura, not intractable, without status migrainosus: Secondary | ICD-10-CM

## 2017-11-17 ENCOUNTER — Encounter: Payer: Self-pay | Admitting: Neurology

## 2017-11-17 ENCOUNTER — Ambulatory Visit (INDEPENDENT_AMBULATORY_CARE_PROVIDER_SITE_OTHER): Payer: BC Managed Care – PPO | Admitting: Neurology

## 2017-11-17 VITALS — BP 136/78 | HR 78 | Ht 62.5 in | Wt 141.8 lb

## 2017-11-17 DIAGNOSIS — G43709 Chronic migraine without aura, not intractable, without status migrainosus: Secondary | ICD-10-CM | POA: Diagnosis not present

## 2017-11-17 NOTE — Patient Instructions (Signed)
1.  Continue propranolol ER 60mg  daily 2.  We will get pre-authorization  For Botox 3.  Follow up for Botox

## 2017-11-17 NOTE — Progress Notes (Signed)
NEUROLOGY FOLLOW UP OFFICE NOTE  [name] [id number]  HISTORY OF PRESENT ILLNESS: Laurie Cherry is a 57 year old right-handed female with rheumatoid arthritis, hypothyroidism, reflux, and mild depression who follows up for chronic migraine.   UPDATE: Intensity: 5-6/10; September: 5-6/10 Duration:  20-30 minutes; September: 20-30 minutes Frequency:  15 headache days last 3 months (25 in December); September: 6 out of 21 days Current NSAIDS:  Advil (for arthritis), naproxen (infrequent) Current analgesics:  none Current triptans:  sumatriptan 50mg  (100mg  caused drowsiness) (w/wo naproxen 500mg ) Current anti-emetic:  none Current muscle relaxants:  none Current anti-anxiolytic:  none Current sleep aide:  none Current Antihypertensive medications:  propranolol ER 60mg  Current Antidepressant medications:  Celexa Current Anticonvulsant medications:  none   Caffeine:  1 cup of coffee daily Alcohol:  no Smoker:  no Diet:  1/2 can of soda daily.  Drinks water. Exercise:  no Depression/stress:  no Sleep hygiene:  varies   HISTORY: She has a history of "hormonal" migraines most of her life, described as pounding right temporal pain and often associated with her menstrual cycle.  They have pretty much stabilized but she will still get it every now and then.     She was diagnosed with rheumatoid arthritis in 2014.  She had been on methotrexate for about 2 years (stopped a month ago).  In September 2015, she began having new headaches.  It coincided with change in her thyroid medications.  They are left sided, 7/10 non-throbbing pain.    They are associated with photophobia, phonophobia, and osmophobia but not nausea.   They typically last 3 to 4 days and occur 1 to 2 times a week (however she also has a dull daily headache) Stress seems to be a trigger.  Change in thyroid medication may have been a trigger. These headaches do not correlate with her menstrual cycle.  Sumatriptan  with naproxen helps relieve it.   Past NSAIDS:  no Past analgesics:  Excedrin, Tylenol (ineffective) Past abortive triptans:  no Past muscle relaxants:  no Past anti-emetic:  no Past antihypertensive medications:  atenolol (25mg  ineffective and higher doses caused dizziness) Past antidepressant medications:  venlafaxine XR 150mg  Past anticonvulsant medications:  topiramate 25mg  (caused drowsiness, but willing to retry it if needed) Past vitamins/Herbal/Supplements:  no Past antihistamines/decongestants:  no Other past therapies:  no   Family history of headache:  Mom, sister Mother had glioblastoma  PAST MEDICAL HISTORY: Past Medical History:  Diagnosis Date  . Depression   . Esophagitis   . Family history of adverse reaction to anesthesia    Sister  . Fibroids   . GERD (gastroesophageal reflux disease)   . Hypothyroidism   . Rheumatoid arthritis ([place])   . Rheumatoid arthritis ([place]) 01/20/2017  . Worsening headaches     MEDICATIONS: Current Outpatient Medications on File Prior to Visit  Medication Sig Dispense Refill  . Adalimumab (HUMIRA PEN) 40 MG/0.8ML PNKT Inject 40 mg into the skin every 14 (fourteen) days. Every other week on Sundays    . albuterol (PROVENTIL HFA;VENTOLIN HFA) 108 (90 Base) MCG/ACT inhaler Inhale 1-2 puffs into the lungs every 6 (six) hours as needed for wheezing or shortness of breath.    Marland Kitchen azelastine (ASTELIN) 0.1 % nasal spray Place 1-2 sprays into both nostrils 2 (two) times daily as needed for rhinitis. Use in each nostril as directed    . Cholecalciferol 5000 units TABS Take 5,000 Units by mouth daily with lunch.     Marland Kitchen  citalopram (CELEXA) 20 MG tablet Take 10 mg by mouth every evening.     Marland Kitchen estradiol (ESTRACE) 2 MG tablet Take 2 mg by mouth daily.  5  . folic acid (FOLVITE) 1 MG tablet Take 1 mg by mouth daily with lunch.    . ibuprofen (ADVIL,MOTRIN) 600 MG tablet Take 1 tablet (600 mg total) by mouth every 6 (six) hours as needed (mild pain).  30 tablet 0  . methotrexate (RHEUMATREX) 2.5 MG tablet Take 10 mg by mouth every Wednesday at 6 PM. Caution:Chemotherapy. Protect from light.     . naproxen (NAPROSYN) 500 MG tablet TAKE 1 TABLET(500 MG) BY MOUTH EVERY 12 HOURS AS NEEDED 10 tablet 1  . pantoprazole (PROTONIX) 40 MG tablet Take 40 mg by mouth daily.    . propranolol ER (INDERAL LA) 60 MG 24 hr capsule TAKE 1 CAPSULE(60 MG) BY MOUTH DAILY (Patient taking differently: TAKE 1 CAPSULE(60 MG) BY MOUTH DAILY at 10 AM) 30 capsule 5  . SUMAtriptan (IMITREX) 100 MG tablet SEE NOTES (Patient taking differently: TAKE 1 TABLET (100 MG)  AT ONSET OF MIGRAINE HEADACHE AS NEEDED) 10 tablet 0  . SUMAtriptan (IMITREX) 100 MG tablet SEE NOTES 10 tablet 0  . Tetrahydrozoline-Zn Sulfate (ALLERGY RELIEF EYE DROPS OP) Place 1-2 drops into both eyes 3 (three) times daily as needed (FOR IRRITATED/ALLERGY EYES).    . TIROSINT 100 MCG CAPS TAKE 1 CAPSULE(100 MCG) BY MOUTH DAILY BEFORE BREAKFAST 90 capsule 3  . Vitamin D, Ergocalciferol, (DRISDOL) 50000 units CAPS capsule Take 50,000 Units by mouth every Sunday. ~10AM     No current facility-administered medications on file prior to visit.     ALLERGIES: Allergies  Allergen Reactions  . Arava [Leflunomide] Hives  . Azithromycin Other (See Comments)    Severe bloating, abd pain Severe bloating, abd pain  . Cefuroxime Axetil Other (See Comments)    Increased bloating, abd pain  . Clarithromycin Other (See Comments)    abd pain abd pain  . Etanercept Other (See Comments) and Hives    Injection site irritation Injection site irritation  . Levofloxacin Other (See Comments) and Hives    Severe dizziness Severe dizziness  . Sulfa Antibiotics Hives    abd pain  . Methotrexate Derivatives Other (See Comments)    Injection ONLY (irritation at the site of injection)    FAMILY HISTORY: Family History  Problem Relation Age of Onset  . Thyroid disease Mother   . Heart attack Mother   . Stroke  Mother   . Diabetes Mother   . Brain cancer Mother   . Colon cancer Father   . Heart attack Sister   . Hypertension Sister   . Hypertension Brother     SOCIAL HISTORY: Social History   Socioeconomic History  . Marital status: Married    Spouse name: Not on file  . Number of children: Not on file  . Years of education: Not on file  . Highest education level: Not on file  Social Needs  . Financial resource strain: Not on file  . Food insecurity - worry: Not on file  . Food insecurity - inability: Not on file  . Transportation needs - medical: Not on file  . Transportation needs - non-medical: Not on file  Occupational History  . Not on file  Tobacco Use  . Smoking status: Never Smoker  . Smokeless tobacco: Never Used  Substance and Sexual Activity  . Alcohol use: No    Alcohol/week: 0.0 oz  .  Drug use: No  . Sexual activity: Not on file  Other Topics Concern  . Not on file  Social History Narrative  . Not on file    REVIEW OF SYSTEMS: Constitutional: No fevers, chills, or sweats, no generalized fatigue, change in appetite Eyes: No visual changes, double vision, eye pain Ear, nose and throat: No hearing loss, ear pain, nasal congestion, sore throat Cardiovascular: No chest pain, palpitations Respiratory:  No shortness of breath at rest or with exertion, wheezes GastrointestinaI: No nausea, vomiting, diarrhea, abdominal pain, fecal incontinence Genitourinary:  No dysuria, urinary retention or frequency Musculoskeletal:  No neck pain, back pain Integumentary: No rash, pruritus, skin lesions Neurological: as above Psychiatric: No depression, insomnia, anxiety Endocrine: No palpitations, fatigue, diaphoresis, mood swings, change in appetite, change in weight, increased thirst Hematologic/Lymphatic:  No purpura, petechiae. Allergic/Immunologic: no itchy/runny eyes, nasal congestion, recent allergic reactions, rashes  PHYSICAL EXAM: Vitals:   11/17/17 1432  BP:  136/78  Pulse: 78  SpO2: 94%   General: No acute distress.  Patient appears well-groomed.   Head:  Normocephalic/atraumatic Eyes:  Fundi examined but not visualized Neck: supple, no paraspinal tenderness, full range of motion Heart:  Regular rate and rhythm Lungs:  Clear to auscultation bilaterally Back: No paraspinal tenderness Neurological Exam: alert and oriented to person, place, and time. Attention span and concentration intact, recent and remote memory intact, fund of knowledge intact.  Speech fluent and not dysarthric, language intact.  CN II-XII intact. Bulk and tone normal, muscle strength 5/5 throughout.  Sensation to light touch  intact.  Deep tendon reflexes 2+ throughout.  Finger to nose testing intact.  Gait normal, Romberg negative.  IMPRESSION: Chronic migraine.  Meets criteria for Botox (at least 15 days of headache per month for 3 consecutive months, failed multiple preventatives such as propranolol, atenolol, venlafaxine and topiramate.  PLAN: 1.  Botox 2.  Continue propranolol ER 60mg  daily for now. 3.  Sumatriptan as needed  Metta Clines, DO  CC:  Ephriam Jenkins

## 2017-11-18 NOTE — Progress Notes (Signed)
Submitted Botox PA online, cover my meds, KEY: [id number]

## 2017-11-19 NOTE — Progress Notes (Signed)
Rcvd approval for Botox on cover my meds PA cased ID [id number] Will receive additional faxed info

## 2017-11-25 ENCOUNTER — Other Ambulatory Visit: Payer: Self-pay

## 2017-11-25 MED ORDER — TIROSINT 100 MCG PO CAPS: ORAL_CAPSULE | ORAL | 3 refills | Status: DC

## 2017-12-08 ENCOUNTER — Other Ambulatory Visit: Payer: Self-pay | Admitting: Neurology

## 2017-12-08 DIAGNOSIS — G43009 Migraine without aura, not intractable, without status migrainosus: Secondary | ICD-10-CM

## 2017-12-19 ENCOUNTER — Ambulatory Visit (INDEPENDENT_AMBULATORY_CARE_PROVIDER_SITE_OTHER): Payer: BC Managed Care – PPO | Admitting: Neurology

## 2017-12-19 DIAGNOSIS — G43709 Chronic migraine without aura, not intractable, without status migrainosus: Secondary | ICD-10-CM

## 2017-12-19 MED ORDER — ONABOTULINUMTOXINA 100 UNITS IJ SOLR
155.0000 [IU] | Freq: Once | INTRAMUSCULAR | Status: AC
  Administered 2017-12-19: 155 [IU] via INTRAMUSCULAR

## 2017-12-19 NOTE — Progress Notes (Signed)
[facility]   Procedure Note Botox  Attending: Dr. Metta Clines  Preoperative Diagnosis(es): Chronic migraine  Consent obtained from: The patient Benefits discussed included, but were not limited to decreased muscle tightness, increased joint range of motion, and decreased pain.  Risk discussed included, but were not limited pain and discomfort, bleeding, bruising, excessive weakness, venous thrombosis, muscle atrophy and dysphagia.  Anticipated outcomes of the procedure as well as he risks and benefits of the alternatives to the procedure, and the roles and tasks of the personnel to be involved, were discussed with the patient, and the patient consents to the procedure and agrees to proceed. A copy of the patient medication guide was given to the patient which explains the blackbox warning.  Patients identity and treatment sites confirmed Yes.  .  Details of Procedure: Skin was cleaned with alcohol. Prior to injection, the needle plunger was aspirated to make sure the needle was not within a blood vessel.  There was no blood retrieved on aspiration.    Following is a summary of the muscles injected  And the amount of Botulinum toxin used:  Dilution 200 units of Botox was reconstituted with 4 ml of preservative free normal saline. Time of reconstitution: At the time of the office visit (<30 minutes prior to injection)   Injections  155 total units of Botox was injected with a 30 gauge needle.  Injection Sites: L occipitalis: 15 units- 3 sites  R occiptalis: 15 units- 3 sites  L upper trapezius: 15 units- 3 sites R upper trapezius: 15 units- 3 sits          L paraspinal: 10 units- 2 sites R paraspinal: 10 units- 2 sites  Face L frontalis(2 injection sites):10 units   R frontalis(2 injection sites):10 units         L corrugator: 5 units   R corrugator: 5 units           Procerus: 5 units   L temporalis: 20 units R temporalis: 20 units   Agent:  200 units of botulinum Type  A (Onobotulinum Toxin type A) was reconstituted with 4 ml of preservative free normal saline.  Time of reconstitution: At the time of the office visit (<30 minutes prior to injection)     Total injected (Units): 155  Total wasted (Units): 0  Patient tolerated procedure well without complications.   Reinjection is anticipated in 3 months. Return to clinic in 4.5 months.

## 2018-01-05 ENCOUNTER — Other Ambulatory Visit: Payer: Self-pay | Admitting: Neurology

## 2018-01-05 DIAGNOSIS — G43009 Migraine without aura, not intractable, without status migrainosus: Secondary | ICD-10-CM

## 2018-01-05 NOTE — Telephone Encounter (Signed)
Rx request for Imitrex 100 mg Last OV 11/17/2017 Last refilled 12/08/2017 #10 Next OV 01/19/2018

## 2018-01-09 ENCOUNTER — Telehealth: Payer: Self-pay | Admitting: "Endocrinology

## 2018-01-09 NOTE — Telephone Encounter (Signed)
Laurie Cherry is asking for help with a coupon or something in the cost of her [place] 100 MCG CAPS please advise?

## 2018-01-09 NOTE — Telephone Encounter (Signed)
Tirosint discount card sent to pharmacy for pt. For $25 copay for 1 yr

## 2018-01-19 ENCOUNTER — Ambulatory Visit: Payer: BC Managed Care – PPO | Admitting: Neurology

## 2018-01-26 ENCOUNTER — Other Ambulatory Visit: Payer: Self-pay | Admitting: Neurology

## 2018-01-26 DIAGNOSIS — G43009 Migraine without aura, not intractable, without status migrainosus: Secondary | ICD-10-CM

## 2018-01-29 ENCOUNTER — Encounter: Payer: Self-pay | Admitting: Neurology

## 2018-02-27 ENCOUNTER — Other Ambulatory Visit: Payer: Self-pay | Admitting: Neurology

## 2018-02-27 DIAGNOSIS — G43009 Migraine without aura, not intractable, without status migrainosus: Secondary | ICD-10-CM

## 2018-03-16 ENCOUNTER — Encounter (INDEPENDENT_AMBULATORY_CARE_PROVIDER_SITE_OTHER): Payer: Self-pay | Admitting: *Deleted

## 2018-03-16 ENCOUNTER — Ambulatory Visit (INDEPENDENT_AMBULATORY_CARE_PROVIDER_SITE_OTHER): Payer: BC Managed Care – PPO | Admitting: Internal Medicine

## 2018-03-16 ENCOUNTER — Encounter (INDEPENDENT_AMBULATORY_CARE_PROVIDER_SITE_OTHER): Payer: Self-pay | Admitting: Internal Medicine

## 2018-03-16 VITALS — BP 120/62 | HR 72 | Temp 98.4°F | Ht 62.2 in | Wt 140.0 lb

## 2018-03-16 DIAGNOSIS — R1310 Dysphagia, unspecified: Secondary | ICD-10-CM

## 2018-03-16 DIAGNOSIS — Z800 Family history of malignant neoplasm of digestive organs: Secondary | ICD-10-CM

## 2018-03-16 DIAGNOSIS — R1319 Other dysphagia: Secondary | ICD-10-CM

## 2018-03-16 NOTE — Progress Notes (Addendum)
Subjective:    Patient ID: Laurie Cherry, female    DOB: [birth year], 57 y.o.   MRN: [id number]  HPI Presents today with c/o dysphagia.She says she is having some dysphagia x 1 year.  She feels like foods are lodging mid-esophagus. She will start hiccupping. She will have to vomit the bolus. She does have some acid reflux and takes Tums as needed.  She takes Dexilant as needed.  Family hx of colon cancer in a father in his 13s.. Her last colonoscopy was in 2013 which was normal except for diverticular left colon.   01/18/2011 EGD:  Chest pian. Hx of reflux. ? Eosinophilic esophagitis in the past.  Normal.  Biopsy: Reflux esophagitis.    Review of Systems   Past Medical History:  Diagnosis Date  . Depression   . Esophagitis   . Family history of adverse reaction to anesthesia    Sister  . Fibroids   . GERD (gastroesophageal reflux disease)   . Hypothyroidism   . Rheumatoid arthritis ([place])   . Rheumatoid arthritis ([place]) 01/20/2017  . Worsening headaches     Past Surgical History:  Procedure Laterality Date  . ABDOMINAL HYSTERECTOMY N/A 04/03/2017   Procedure: TOTAL HYSTERECTOMY ABDOMINAL;  Surgeon: Louretta Shorten, MD;  Location: [facility];  Service: Gynecology;  Laterality: N/A;  . EYE SURGERY Right   . SALPINGOOPHORECTOMY Bilateral 04/03/2017   Procedure: SALPINGO OOPHORECTOMY;  Surgeon: Louretta Shorten, MD;  Location: [facility];  Service: Gynecology;  Laterality: Bilateral;    Allergies  Allergen Reactions  . Arava [Leflunomide] Hives  . Azithromycin Other (See Comments)    Severe bloating, abd pain Severe bloating, abd pain  . Cefuroxime Axetil Other (See Comments)    Increased bloating, abd pain  . Clarithromycin Other (See Comments)    abd pain abd pain  . Etanercept Other (See Comments) and Hives    Injection site irritation Injection site irritation  . Levofloxacin Other (See Comments) and Hives    Severe dizziness Severe dizziness  . Sulfa Antibiotics Hives    abd pain   . Methotrexate Derivatives Other (See Comments)    Injection ONLY (irritation at the site of injection)    Current Outpatient Medications on File Prior to Visit  Medication Sig Dispense Refill  . Adalimumab (HUMIRA PEN) 40 MG/0.8ML PNKT Inject 40 mg into the skin every 14 (fourteen) days. Every other week on Sundays    . albuterol (PROVENTIL HFA;VENTOLIN HFA) 108 (90 Base) MCG/ACT inhaler Inhale 1-2 puffs into the lungs every 6 (six) hours as needed for wheezing or shortness of breath.    Marland Kitchen azelastine (ASTELIN) 0.1 % nasal spray Place 1-2 sprays into both nostrils 2 (two) times daily as needed for rhinitis. Use in each nostril as directed    . citalopram (CELEXA) 20 MG tablet Take 10 mg by mouth every evening.     Marland Kitchen estradiol (ESTRACE) 2 MG tablet Take 2 mg by mouth daily.  5  . folic acid (FOLVITE) 1 MG tablet Take 1 mg by mouth daily with lunch.    . ibuprofen (ADVIL,MOTRIN) 600 MG tablet Take 1 tablet (600 mg total) by mouth every 6 (six) hours as needed (mild pain). 30 tablet 0  . methotrexate (RHEUMATREX) 2.5 MG tablet Take 10 mg by mouth every Wednesday at 6 PM. Caution:Chemotherapy. Protect from light.     . naproxen (NAPROSYN) 500 MG tablet TAKE 1 TABLET(500 MG) BY MOUTH EVERY 12 HOURS AS NEEDED 10 tablet 1  . propranolol  ER (INDERAL LA) 60 MG 24 hr capsule TAKE 1 CAPSULE(60 MG) BY MOUTH DAILY 90 capsule 1  . SUMAtriptan (IMITREX) 100 MG tablet SEE NOTES (Patient taking differently: TAKE 1 TABLET (100 MG)  AT ONSET OF MIGRAINE HEADACHE AS NEEDED) 10 tablet 0  . SUMAtriptan (IMITREX) 100 MG tablet SEE NOTES 10 tablet 5  . Tetrahydrozoline-Zn Sulfate (ALLERGY RELIEF EYE DROPS OP) Place 1-2 drops into both eyes 3 (three) times daily as needed (FOR IRRITATED/ALLERGY EYES).    . TIROSINT 100 MCG CAPS TAKE 1 CAPSULE(100 MCG) BY MOUTH DAILY BEFORE BREAKFAST 90 capsule 3  . Vitamin D, Ergocalciferol, (DRISDOL) 50000 units CAPS capsule Take 50,000 Units by mouth every Sunday. ~10AM     No  current facility-administered medications on file prior to visit.         Objective:   Physical Exam Blood pressure 120/62, pulse 72, temperature 98.4 F (36.9 C), height 5' 2.2" (1.58 m), weight 140 lb (63.5 kg), last menstrual period 03/31/2017. Alert and oriented. Skin warm and dry. Oral mucosa is moist.   . Sclera anicteric, conjunctivae is pink. Thyroid not enlarged. No cervical lymphadenopathy. Lungs clear. Heart regular rate and rhythm.  Abdomen is soft. Bowel sounds are positive. No hepatomegaly. No abdominal masses felt. No tenderness.  No edema to lower extremities.           Assessment & Plan:  Dysphagia. Will get an Esophagram. She will start the Dexilant daily.  Family hx of colon cancer: Needs surveillance colonoscopy.

## 2018-03-16 NOTE — Patient Instructions (Signed)
Will schedule a DG esophagram.

## 2018-03-19 ENCOUNTER — Ambulatory Visit (HOSPITAL_COMMUNITY)
Admission: RE | Admit: 2018-03-19 | Discharge: 2018-03-19 | Disposition: A | Discharge status: Home / Self Care | Payer: BC Managed Care – PPO | Source: Ambulatory Visit | Attending: Internal Medicine | Admitting: Internal Medicine

## 2018-03-19 ENCOUNTER — Telehealth (INDEPENDENT_AMBULATORY_CARE_PROVIDER_SITE_OTHER): Payer: Self-pay | Admitting: Internal Medicine

## 2018-03-19 ENCOUNTER — Other Ambulatory Visit (INDEPENDENT_AMBULATORY_CARE_PROVIDER_SITE_OTHER): Payer: Self-pay | Admitting: Internal Medicine

## 2018-03-19 DIAGNOSIS — R1310 Dysphagia, unspecified: Secondary | ICD-10-CM | POA: Diagnosis not present

## 2018-03-19 DIAGNOSIS — Z800 Family history of malignant neoplasm of digestive organs: Secondary | ICD-10-CM

## 2018-03-19 DIAGNOSIS — R1319 Other dysphagia: Secondary | ICD-10-CM

## 2018-03-19 DIAGNOSIS — K222 Esophageal obstruction: Secondary | ICD-10-CM | POA: Diagnosis not present

## 2018-03-19 NOTE — Telephone Encounter (Signed)
Laurie Cherry, EGD/ED colonoscopy

## 2018-03-20 ENCOUNTER — Encounter (INDEPENDENT_AMBULATORY_CARE_PROVIDER_SITE_OTHER): Payer: Self-pay | Admitting: *Deleted

## 2018-03-20 ENCOUNTER — Telehealth (INDEPENDENT_AMBULATORY_CARE_PROVIDER_SITE_OTHER): Payer: Self-pay | Admitting: *Deleted

## 2018-03-20 DIAGNOSIS — R1319 Other dysphagia: Secondary | ICD-10-CM | POA: Insufficient documentation

## 2018-03-20 DIAGNOSIS — R1310 Dysphagia, unspecified: Secondary | ICD-10-CM | POA: Insufficient documentation

## 2018-03-20 DIAGNOSIS — Z800 Family history of malignant neoplasm of digestive organs: Secondary | ICD-10-CM | POA: Insufficient documentation

## 2018-03-20 NOTE — Telephone Encounter (Signed)
TCS/EGD/ED sch'd 04/10/18, left detailed message for patient, instructions mailed

## 2018-03-20 NOTE — Telephone Encounter (Signed)
Patient needs trilyte 

## 2018-03-24 MED ORDER — PEG 3350-KCL-NA BICARB-NACL 420 G PO SOLR: 4000.0000 mL | Freq: Once | ORAL | 0 refills | Status: AC

## 2018-03-27 ENCOUNTER — Ambulatory Visit (INDEPENDENT_AMBULATORY_CARE_PROVIDER_SITE_OTHER): Payer: BC Managed Care – PPO | Admitting: Neurology

## 2018-03-27 DIAGNOSIS — G43709 Chronic migraine without aura, not intractable, without status migrainosus: Secondary | ICD-10-CM

## 2018-03-27 MED ORDER — ONABOTULINUMTOXINA 100 UNITS IJ SOLR
155.0000 [IU] | Freq: Once | INTRAMUSCULAR | Status: AC
  Administered 2018-03-27: 155 [IU] via INTRAMUSCULAR

## 2018-03-27 NOTE — Progress Notes (Signed)
[facility]   Procedure Note Botox  Attending: Dr. Tomi Likens  Preoperative Diagnosis(es): Chronic migraine  Consent obtained from: The patient Benefits discussed included, but were not limited to decreased muscle tightness, increased joint range of motion, and decreased pain.  Risk discussed included, but were not limited pain and discomfort, bleeding, bruising, excessive weakness, venous thrombosis, muscle atrophy and dysphagia.  Anticipated outcomes of the procedure as well as he risks and benefits of the alternatives to the procedure, and the roles and tasks of the personnel to be involved, were discussed with the patient, and the patient consents to the procedure and agrees to proceed. A copy of the patient medication guide was given to the patient which explains the blackbox warning.  Patients identity and treatment sites confirmed Yes.  .  Details of Procedure: Skin was cleaned with alcohol. Prior to injection, the needle plunger was aspirated to make sure the needle was not within a blood vessel.  There was no blood retrieved on aspiration.    Following is a summary of the muscles injected  And the amount of Botulinum toxin used:  Dilution 200 units of Botox was reconstituted with 4 ml of preservative free normal saline. Time of reconstitution: At the time of the office visit (<30 minutes prior to injection)   Injections  155 total units of Botox was injected with a 30 gauge needle.  Injection Sites: L occipitalis: 15 units- 3 sites  R occiptalis: 15 units- 3 sites  L upper trapezius: 15 units- 3 sites R upper trapezius: 15 units- 3 sits          L paraspinal: 10 units- 2 sites R paraspinal: 10 units- 2 sites  Face L frontalis(2 injection sites):10 units   R frontalis(2 injection sites):10 units         L corrugator: 5 units   R corrugator: 5 units           Procerus: 5 units   L temporalis: 20 units R temporalis: 20 units   Agent:  200 units of botulinum Type A  (Onobotulinum Toxin type A) was reconstituted with 4 ml of preservative free normal saline.  Time of reconstitution: At the time of the office visit (<30 minutes prior to injection)     Total injected (Units): 155  Total wasted (Units): None wasted  Patient tolerated procedure well without complications.   Reinjection is anticipated in 3 months. Return to clinic in 5-6 weeks.

## 2018-03-27 NOTE — Progress Notes (Addendum)
Rcvd call from Vela Prose, RN with [employer] case management 03/25/18. Needed clinicals faxed to [phone].  Rcvd fax 03/26/18, Botox apprvd  [id number] JQD#[id number] 03/24/18 - 09/20/18 2 visits [id number]#[id number] 03/24/18 - 09/20/18 2 visits

## 2018-03-31 ENCOUNTER — Other Ambulatory Visit: Payer: Self-pay | Admitting: Neurology

## 2018-03-31 DIAGNOSIS — G43009 Migraine without aura, not intractable, without status migrainosus: Secondary | ICD-10-CM

## 2018-04-10 ENCOUNTER — Encounter (HOSPITAL_COMMUNITY): Payer: Self-pay | Admitting: *Deleted

## 2018-04-10 ENCOUNTER — Ambulatory Visit (HOSPITAL_COMMUNITY)
Admission: RE | Admit: 2018-04-10 | Discharge: 2018-04-10 | Disposition: A | Discharge status: Home / Self Care | Payer: BC Managed Care – PPO | Source: Ambulatory Visit | Attending: Internal Medicine | Admitting: Internal Medicine

## 2018-04-10 ENCOUNTER — Other Ambulatory Visit: Payer: Self-pay

## 2018-04-10 ENCOUNTER — Encounter (HOSPITAL_COMMUNITY)
Admission: RE | Disposition: A | Discharge status: Home / Self Care | Payer: Self-pay | Source: Ambulatory Visit | Attending: Internal Medicine

## 2018-04-10 DIAGNOSIS — M069 Rheumatoid arthritis, unspecified: Secondary | ICD-10-CM | POA: Diagnosis not present

## 2018-04-10 DIAGNOSIS — R1310 Dysphagia, unspecified: Secondary | ICD-10-CM | POA: Diagnosis present

## 2018-04-10 DIAGNOSIS — K228 Other specified diseases of esophagus: Secondary | ICD-10-CM | POA: Diagnosis not present

## 2018-04-10 DIAGNOSIS — Z800 Family history of malignant neoplasm of digestive organs: Secondary | ICD-10-CM

## 2018-04-10 DIAGNOSIS — E039 Hypothyroidism, unspecified: Secondary | ICD-10-CM | POA: Insufficient documentation

## 2018-04-10 DIAGNOSIS — Z888 Allergy status to other drugs, medicaments and biological substances status: Secondary | ICD-10-CM | POA: Diagnosis not present

## 2018-04-10 DIAGNOSIS — F329 Major depressive disorder, single episode, unspecified: Secondary | ICD-10-CM | POA: Diagnosis not present

## 2018-04-10 DIAGNOSIS — K449 Diaphragmatic hernia without obstruction or gangrene: Secondary | ICD-10-CM | POA: Diagnosis not present

## 2018-04-10 DIAGNOSIS — Z882 Allergy status to sulfonamides status: Secondary | ICD-10-CM | POA: Insufficient documentation

## 2018-04-10 DIAGNOSIS — Z881 Allergy status to other antibiotic agents status: Secondary | ICD-10-CM | POA: Diagnosis not present

## 2018-04-10 DIAGNOSIS — Z1211 Encounter for screening for malignant neoplasm of colon: Secondary | ICD-10-CM | POA: Diagnosis not present

## 2018-04-10 DIAGNOSIS — K6289 Other specified diseases of anus and rectum: Secondary | ICD-10-CM | POA: Diagnosis not present

## 2018-04-10 DIAGNOSIS — K222 Esophageal obstruction: Secondary | ICD-10-CM | POA: Diagnosis not present

## 2018-04-10 DIAGNOSIS — K219 Gastro-esophageal reflux disease without esophagitis: Secondary | ICD-10-CM | POA: Insufficient documentation

## 2018-04-10 DIAGNOSIS — Z79899 Other long term (current) drug therapy: Secondary | ICD-10-CM | POA: Insufficient documentation

## 2018-04-10 DIAGNOSIS — R1319 Other dysphagia: Secondary | ICD-10-CM

## 2018-04-10 HISTORY — PX: ESOPHAGOGASTRODUODENOSCOPY: SHX5428

## 2018-04-10 HISTORY — PX: BIOPSY: SHX5522

## 2018-04-10 HISTORY — PX: ESOPHAGEAL DILATION: SHX303

## 2018-04-10 HISTORY — PX: COLONOSCOPY: SHX5424

## 2018-04-10 SURGERY — EGD (ESOPHAGOGASTRODUODENOSCOPY)
Anesthesia: Moderate Sedation

## 2018-04-10 MED ORDER — MEPERIDINE HCL 50 MG/ML IJ SOLN
INTRAMUSCULAR | Status: DC | PRN
  Administered 2018-04-10 (×2): 25 mg via INTRAVENOUS

## 2018-04-10 MED ORDER — LIDOCAINE VISCOUS HCL 2 % MT SOLN
OROMUCOSAL | Status: DC | PRN
  Administered 2018-04-10: 4 mL via OROMUCOSAL

## 2018-04-10 MED ORDER — MIDAZOLAM HCL 5 MG/5ML IJ SOLN
INTRAMUSCULAR | Status: AC
  Filled 2018-04-10: qty 10

## 2018-04-10 MED ORDER — SODIUM CHLORIDE 0.9 % IV SOLN
INTRAVENOUS | Status: DC
  Administered 2018-04-10: 12:00:00 via INTRAVENOUS

## 2018-04-10 MED ORDER — MIDAZOLAM HCL 5 MG/5ML IJ SOLN
INTRAMUSCULAR | Status: DC | PRN
  Administered 2018-04-10 (×2): 1 mg via INTRAVENOUS
  Administered 2018-04-10 (×4): 2 mg via INTRAVENOUS

## 2018-04-10 MED ORDER — STERILE WATER FOR IRRIGATION IR SOLN
Status: DC | PRN
  Administered 2018-04-10: 13:00:00

## 2018-04-10 MED ORDER — LIDOCAINE VISCOUS HCL 2 % MT SOLN
OROMUCOSAL | Status: AC
  Filled 2018-04-10: qty 15

## 2018-04-10 MED ORDER — MEPERIDINE HCL 50 MG/ML IJ SOLN
INTRAMUSCULAR | Status: AC
  Filled 2018-04-10: qty 1

## 2018-04-10 NOTE — Op Note (Signed)
[facility] Patient Name: Laurie Cherry Procedure Date: 04/10/2018 12:54 PM MRN: [id number] Date of Birth: 1961/10/20 Attending MD: Hildred Laser , MD CSN: [id number] Age: 57 Admit Type: Outpatient Procedure:                Colonoscopy Indications:              Screening for colorectal malignant neoplasm.                            Colorectal cancer in father 28 or older Providers:                Hildred Laser, MD, Otis Peak B. Sharon Seller, RN, Randa Spike, Technician Referring MD:             Laray Anger NP, NP Medicines:                Midazolam 4 mg IV Complications:            No immediate complications. Estimated Blood Loss:     Estimated blood loss: none. Procedure:                Pre-Anesthesia Assessment:                           - Prior to the procedure, a History and Physical                            was performed, and patient medications and                            allergies were reviewed. The patient's tolerance of                            previous anesthesia was also reviewed. The risks                            and benefits of the procedure and the sedation                            options and risks were discussed with the patient.                            All questions were answered, and informed consent                            was obtained. Prior Anticoagulants: The patient has                            taken no previous anticoagulant or antiplatelet                            agents. ASA Grade Assessment: III - A patient with  severe systemic disease. After reviewing the risks                            and benefits, the patient was deemed in                            satisfactory condition to undergo the procedure.                           After obtaining informed consent, the colonoscope                            was passed under direct vision. Throughout the                             procedure, the patient's blood pressure, pulse, and                            oxygen saturations were monitored continuously. The                            [id number] ([id number]) was introduced through the                            anus and advanced to the the cecum, identified by                            appendiceal orifice and ileocecal valve. The                            colonoscopy was performed without difficulty. The                            patient tolerated the procedure well. The quality                            of the bowel preparation was good. The ileocecal                            valve, appendiceal orifice, and rectum were                            photographed. Scope In: 12:57:28 PM Scope Out: 1:11:37 PM Scope Withdrawal Time: 0 hours 8 minutes 2 seconds  Total Procedure Duration: 0 hours 14 minutes 9 seconds  Findings:      The perianal and digital rectal examinations were normal.      The colon (entire examined portion) appeared normal.      Anal papilla(e) were hypertrophied. Impression:               - The entire examined colon is normal.                           - Two small anal papillae.                           -  No specimens collected. Moderate Sedation:      Moderate (conscious) sedation was administered by the endoscopy nurse       and supervised by the endoscopist. The following parameters were       monitored: oxygen saturation, heart rate, blood pressure, CO2       capnography and response to care. Total physician intraservice time was       19 minutes. Recommendation:           - Patient has a contact number available for                            emergencies. The signs and symptoms of potential                            delayed complications were discussed with the                            patient. Return to normal activities tomorrow.                            Written discharge instructions were provided to the                             patient.                           - Resume previous diet today.                           - Continue present medications.                           - Repeat colonoscopy in 10 years for screening                            purposes. Procedure Code(s):        --- Professional ---                           [phone], Colonoscopy, flexible; diagnostic, including                            collection of specimen(s) by brushing or washing,                            when performed (separate procedure)                           G0500, Moderate sedation services provided by the                            same physician or other qualified health care                            professional performing a gastrointestinal  endoscopic service that sedation supports,                            requiring the presence of an independent trained                            observer to assist in the monitoring of the                            patient's level of consciousness and physiological                            status; initial 15 minutes of intra-service time;                            patient age 58 years or older (additional time may                            be reported with [phone], as appropriate) Diagnosis Code(s):        --- Professional ---                           K62.89, Other specified diseases of anus and rectum                           Z80.0, Family history of malignant neoplasm of                            digestive organs                           Z12.11, Encounter for screening for malignant                            neoplasm of colon CPT copyright 2017 American Medical Association. All rights reserved. The codes documented in this report are preliminary and upon coder review may  be revised to meet current compliance requirements. Hildred Laser, MD Hildred Laser, MD 04/10/2018 1:27:46 PM This report has been signed electronically. Number of Addenda:  0

## 2018-04-10 NOTE — H&P (Signed)
Laurie Cherry is an 57 y.o. female.   Chief Complaint: Patient is here for EGD, ED and colonoscopy. HPI: Patient is a 57 year old Caucasian female with history of GERD and presents with 1 year history of solid food dysphagia.  It occurs intermittently.  Is with dry foods.  She has at least one episode of food impaction relieved with self-induced vomiting every 3 weeks.  She was having heartburn 2-3 times a week until she went back on a PPI which she has stopped.  She points to upper sternal area sort of bolus obstruction.  He had a barium pill esophagogram revealing stricture distal esophagus.  Barium pill did not pass this area.  She has good appetite.  She denies melena or rectal bleeding.  She is also undergoing screening colonoscopy. History significant for CRC in her father who was 52 at the time of diagnosis and surgery and lived to be 14.  Past Medical History:  Diagnosis Date  . Depression   . Esophagitis   . Family history of adverse reaction to anesthesia    Sister  . Fibroids   . GERD (gastroesophageal reflux disease)   . Hypothyroidism   . Rheumatoid arthritis ([place])   . Rheumatoid arthritis ([place]) 01/20/2017  . Worsening headaches     Past Surgical History:  Procedure Laterality Date  . ABDOMINAL HYSTERECTOMY N/A 04/03/2017   Procedure: TOTAL HYSTERECTOMY ABDOMINAL;  Surgeon: Louretta Shorten, MD;  Location: [facility];  Service: Gynecology;  Laterality: N/A;  . COLONOSCOPY    . COLONOSCOPY    . ESOPHAGOGASTRODUODENOSCOPY    . EYE SURGERY Right   . SALPINGOOPHORECTOMY Bilateral 04/03/2017   Procedure: SALPINGO OOPHORECTOMY;  Surgeon: Louretta Shorten, MD;  Location: [facility];  Service: Gynecology;  Laterality: Bilateral;    Family History  Problem Relation Age of Onset  . Thyroid disease Mother   . Heart attack Mother   . Stroke Mother   . Diabetes Mother   . Brain cancer Mother   . Colon cancer Father   . Heart attack Sister   . Hypertension Sister   . Hypertension Brother     Social History:  reports that she has never smoked. She has never used smokeless tobacco. She reports that she does not drink alcohol or use drugs.  Allergies:  Allergies  Allergen Reactions  . Arava [Leflunomide] Hives  . Azithromycin Other (See Comments)    Severe bloating, abd pain  . Cefuroxime Axetil Other (See Comments)    Increased bloating, abd pain  . Clarithromycin Other (See Comments)    abd pain  . Etanercept Hives and Other (See Comments)    Injection site irritation  . Levofloxacin Hives and Other (See Comments)    Severe dizziness  . Sulfa Antibiotics Hives and Other (See Comments)    abd pain  . Methotrexate Derivatives Other (See Comments)    Injection ONLY (irritation at the site of injection)    Medications Prior to Admission  Medication Sig Dispense Refill  . Adalimumab (HUMIRA PEN) 40 MG/0.8ML PNKT Inject 40 mg into the skin every 14 (fourteen) days. Every other week on Sundays    . BIOTIN PO Take 1 tablet by mouth daily.    . citalopram (CELEXA) 10 MG tablet Take 10 mg by mouth every evening.     Marland Kitchen dexlansoprazole (DEXILANT) 60 MG capsule Take 60 mg by mouth daily.    Marland Kitchen estradiol (ESTRACE) 2 MG tablet Take 2 mg by mouth daily.  5  . methotrexate ([place])  2.5 MG tablet Take 10 mg by mouth every Wednesday at 6 PM. Caution:Chemotherapy. Protect from light.     . naproxen (NAPROSYN) 500 MG tablet TAKE 1 TABLET(500 MG) BY MOUTH EVERY 12 HOURS AS NEEDED (Patient taking differently: TAKE 1 TABLET(500 MG) BY MOUTH EVERY 12 HOURS AS NEEDED FOR MIGRAINE) 10 tablet 1  . propranolol ER (INDERAL LA) 60 MG 24 hr capsule TAKE 1 CAPSULE(60 MG) BY MOUTH DAILY 90 capsule 1  . SUMAtriptan (IMITREX) 100 MG tablet SEE NOTES (Patient taking differently: TAKE 1 TABLET (100 MG)  AT ONSET OF MIGRAINE HEADACHE AS NEEDED) 10 tablet 0  . Tetrahydrozoline-Zn Sulfate (ALLERGY RELIEF EYE DROPS OP) Place 1-2 drops into both eyes 3 (three) times daily as needed (FOR IRRITATED/ALLERGY  EYES).    . TIROSINT 100 MCG CAPS TAKE 1 CAPSULE(100 MCG) BY MOUTH DAILY BEFORE BREAKFAST 90 capsule 3  . Vitamin D, Ergocalciferol, (DRISDOL) 50000 units CAPS capsule Take 50,000 Units by mouth every Sunday.     Marland Kitchen albuterol (PROVENTIL HFA;VENTOLIN HFA) 108 (90 Base) MCG/ACT inhaler Inhale 1-2 puffs into the lungs every 6 (six) hours as needed for wheezing or shortness of breath.    . folic acid (FOLVITE) 1 MG tablet Take 1 mg by mouth daily with lunch.    . ibuprofen (ADVIL,MOTRIN) 600 MG tablet Take 1 tablet (600 mg total) by mouth every 6 (six) hours as needed (mild pain). (Patient not taking: Reported on 04/03/2018) 30 tablet 0    No results found for this or any previous visit (from the past 48 hour(s)). No results found.  ROS  Blood pressure (!) 153/80, pulse 83, temperature 98.6 F (37 C), temperature source Oral, resp. rate 11, height 5\' 2"  (1.575 m), weight 135 lb (61.2 kg), last menstrual period 03/31/2017, SpO2 100 %. Physical Exam  Constitutional: She appears well-developed and well-nourished.  HENT:  Mouth/Throat: Oropharynx is clear and moist.  Eyes: Conjunctivae are normal. No scleral icterus.  Neck: No thyromegaly present.  Cardiovascular: Normal rate, regular rhythm and normal heart sounds.  No murmur heard. Respiratory: Effort normal and breath sounds normal.  GI: Soft. She exhibits no distension.  Lower midline scar.  Musculoskeletal: She exhibits no edema.  Lymphadenopathy:    She has no cervical adenopathy.  Neurological: She is alert.  Skin: Skin is warm and dry.     Assessment/Plan Solid food dysphagia. Esophageal stricture. EGD with ED and screening colonoscopy.  Hildred Laser, MD 04/10/2018, 12:30 PM

## 2018-04-10 NOTE — Discharge Instructions (Signed)
No aspirin or NSAIDs for 3 days. Resume usual medications as before. Resume usual diet. No driving for 24 hours. Physician will call with biopsy results. Can wait 10 years before next screening colonoscopy unless family history changes. Gastrointestinal Endoscopy, Care After Refer to this sheet in the next few weeks. These instructions provide you with information about caring for yourself after your procedure. Your health care provider may also give you more specific instructions. Your treatment has been planned according to current medical practices, but problems sometimes occur. Call your health care provider if you have any problems or questions after your procedure. What can I expect after the procedure? After your procedure, it is common to feel:  Bloated.  Soreness in your throat.  Sleepy.  Follow these instructions at home:  Do not drive for 24 hours if you received a if you received a medicine to help you relax (sedative).  Avoid drinking warm beverages and alcohol for the first 24 hours after the procedure.  Take over-the-counter and prescription medicines only as told by your health care provider.  Drink enough fluids to keep your urine clear or pale yellow.  If you feel bloated, try going for a walk. Walking may help the feeling go away.  If your throat is sore, try gargling with salt water. Get help right away if:  You have severe nausea or vomiting.  You have severe abdominal pain, abdominal cramps that last longer than 6 hours, or abdominal swelling.  You have severe shoulder or back pain.  You have trouble swallowing.  You have shortness of breath, your breathing is shallow, or you breathing is faster than normal.  You have a fever.  Your heart is beating very fast.  You vomit blood or material that looks like coffee grounds.  You have bloody, black, or tarry stools. This information is not intended to replace advice given to you by your health care  provider. Make sure you discuss any questions you have with your health care provider. Document Released: 05/28/2004 Document Revised: 08/21/2016 Document Reviewed: 08/06/2015 Elsevier Interactive Patient Education  2018 [employer].  Colonoscopy, Adult, Care After This sheet gives you information about how to care for yourself after your procedure. Your health care provider may also give you more specific instructions. If you have problems or questions, contact your health care provider. What can I expect after the procedure? After the procedure, it is common to have:  A small amount of blood in your stool for 24 hours after the procedure.  Some gas.  Mild abdominal cramping or bloating.  Follow these instructions at home: General instructions   For the first 24 hours after the procedure: ? Do not drive or use machinery. ? Do not sign important documents. ? Do not drink alcohol. ? Do your regular daily activities at a slower pace than normal. ? Eat soft, easy-to-digest foods. ? Rest often.  Take over-the-counter or prescription medicines only as told by your health care provider.  It is up to you to get the results of your procedure. Ask your health care provider, or the department performing the procedure, when your results will be ready. Relieving cramping and bloating  Try walking around when you have cramps or feel bloated.  Apply heat to your abdomen as told by your health care provider. Use a heat source that your health care provider recommends, such as a moist heat pack or a heating pad. ? Place a towel between your skin and the heat source. ?  Leave the heat on for 20-30 minutes. ? Remove the heat if your skin turns bright red. This is especially important if you are unable to feel pain, heat, or cold. You may have a greater risk of getting burned. Eating and drinking  Drink enough fluid to keep your urine clear or pale yellow.  Resume your normal diet as  instructed by your health care provider. Avoid heavy or fried foods that are hard to digest.  Avoid drinking alcohol for as long as instructed by your health care provider. Contact a health care provider if:  You have blood in your stool 2-3 days after the procedure. Get help right away if:  You have more than a small spotting of blood in your stool.  You pass large blood clots in your stool.  Your abdomen is swollen.  You have nausea or vomiting.  You have a fever.  You have increasing abdominal pain that is not relieved with medicine. This information is not intended to replace advice given to you by your health care provider. Make sure you discuss any questions you have with your health care provider. Document Released: 05/28/2004 Document Revised: 07/08/2016 Document Reviewed: 12/26/2015 Elsevier Interactive Patient Education  [employer].

## 2018-04-10 NOTE — Op Note (Signed)
[facility] Patient Name: Laurie Cherry Procedure Date: 04/10/2018 12:08 PM MRN: [id number] Date of Birth: 04-02-1961 Attending MD: Hildred Laser , MD CSN: [id number] Age: 57 Admit Type: Outpatient Procedure:                Upper GI endoscopy Indications:              Therapeutic procedure, For therapy of esophageal                            stricture Providers:                Hildred Laser, MD, Otis Peak B. Sharon Seller, RN, Randa Spike, Technician Referring MD:             Laray Anger, NP Medicines:                Lidocaine spray, Meperidine 50 mg IV, Midazolam 6                            mg IV Complications:            No immediate complications. Estimated Blood Loss:     Estimated blood loss was minimal. Procedure:                Pre-Anesthesia Assessment:                           - Prior to the procedure, a History and Physical                            was performed, and patient medications and                            allergies were reviewed. The patient's tolerance of                            previous anesthesia was also reviewed. The risks                            and benefits of the procedure and the sedation                            options and risks were discussed with the patient.                            All questions were answered, and informed consent                            was obtained. Prior Anticoagulants: The patient has                            taken no previous anticoagulant or antiplatelet  agents. ASA Grade Assessment: III - A patient with                            severe systemic disease. After reviewing the risks                            and benefits, the patient was deemed in                            satisfactory condition to undergo the procedure.                           After obtaining informed consent, the endoscope was                            passed under direct  vision. Throughout the                            procedure, the patient's blood pressure, pulse, and                            oxygen saturations were monitored continuously. The                            [id number] ([id number]) scope was introduced through the                            mouth, and advanced to the second part of duodenum.                            The upper GI endoscopy was accomplished without                            difficulty. The patient tolerated the procedure                            well. Scope In: 12:41:25 PM Scope Out: 12:52:14 PM Total Procedure Duration: 0 hours 10 minutes 49 seconds  Findings:      Mucosal changes including ringed esophagus, longitudinal furrows and       stenosis were found in the mid esophagus. Biopsies were taken with a       cold forceps for histology.      One benign-appearing, intrinsic moderate stenosis was found 35 cm from       the incisors. This stenosis measured 1.1 cm (inner diameter) x less than       one cm (in length). The stenosis was traversed. A TTS dilator was passed       through the scope. Dilation with a 15-16.5-18 mm balloon dilator was       performed to 15 mm. The dilation site was examined and showed mild       mucosal disruption, moderate improvement in luminal narrowing and no       perforation.      A 2 cm hiatal hernia was present.      The entire examined stomach was  normal.      The duodenal bulb and second portion of the duodenum were normal. Impression:               - Esophageal mucosal changes consistent with                            eosinophilic esophagitis. Biopsied.                           - Benign-appearing esophageal stenosis. Dilated.                           - 2 cm hiatal hernia.                           - Normal stomach.                           - Normal duodenal bulb and second portion of the                            duodenum. Moderate Sedation:      Moderate (conscious) sedation  was administered by the endoscopy nurse       and supervised by the endoscopist. The following parameters were       monitored: oxygen saturation, heart rate, blood pressure, CO2       capnography and response to care. Total physician intraservice time was       16 minutes. Recommendation:           - Patient has a contact number available for                            emergencies. The signs and symptoms of potential                            delayed complications were discussed with the                            patient. Return to normal activities tomorrow.                            Written discharge instructions were provided to the                            patient.                           - Resume previous diet today.                           - Resume previous diet.                           - Continue present medications.                           - No aspirin, ibuprofen, naproxen, or other  non-steroidal anti-inflammatory drugs for 3 days.                           - Await pathology results.                           - Repeat upper endoscopy PRN. Procedure Code(s):        --- Professional ---                           [phone], Esophagogastroduodenoscopy, flexible,                            transoral; with transendoscopic balloon dilation of                            esophagus (less than 30 mm diameter)                           43239, Esophagogastroduodenoscopy, flexible,                            transoral; with biopsy, single or multiple                           G0500, Moderate sedation services provided by the                            same physician or other qualified health care                            professional performing a gastrointestinal                            endoscopic service that sedation supports,                            requiring the presence of an independent trained                            observer to assist in  the monitoring of the                            patient's level of consciousness and physiological                            status; initial 15 minutes of intra-service time;                            patient age 76 years or older (additional time may                            be reported with [phone], as appropriate)                           G0500, Moderate  sedation services provided by the                            same physician or other qualified health care                            professional performing a gastrointestinal                            endoscopic service that sedation supports,                            requiring the presence of an independent trained                            observer to assist in the monitoring of the                            patient's level of consciousness and physiological                            status; initial 15 minutes of intra-service time;                            patient age 37 years or older (additional time may                            be reported with [phone], as appropriate) Diagnosis Code(s):        --- Professional ---                           K22.8, Other specified diseases of esophagus                           K22.2, Esophageal obstruction                           K44.9, Diaphragmatic hernia without obstruction or                            gangrene CPT copyright 2017 [employer]. All rights reserved. The codes documented in this report are preliminary and upon coder review may  be revised to meet current compliance requirements. Hildred Laser, MD Hildred Laser, MD 04/10/2018 1:23:55 PM This report has been signed electronically. Number of Addenda: 0

## 2018-04-13 ENCOUNTER — Telehealth: Payer: Self-pay | Admitting: "Endocrinology

## 2018-04-13 ENCOUNTER — Telehealth (INDEPENDENT_AMBULATORY_CARE_PROVIDER_SITE_OTHER): Payer: Self-pay | Admitting: Internal Medicine

## 2018-04-13 DIAGNOSIS — E039 Hypothyroidism, unspecified: Secondary | ICD-10-CM

## 2018-04-13 NOTE — Telephone Encounter (Signed)
We can order thyroid ultrasound to study her thyroid better. We can not change the dose of her Tirosint without blood work. She can do her labs soon and reschedule her visit.  The thyroid treatment is unlikely to help her hair. She may have to consider consult with a dermatologist.

## 2018-04-13 NOTE — Telephone Encounter (Signed)
Pt notified. U/S ordered and pt will have bloodwork done

## 2018-04-13 NOTE — Telephone Encounter (Signed)
Laurie Cherry is stating that she was examined by her [name] and [name] told her that her thyroid felf puffy and she states that she is loosing more of her hair and her scalp feels very tingly and she is asking what should she do, please advise?

## 2018-04-13 NOTE — Telephone Encounter (Signed)
Please let patient know that Dr.Rehman will call himself with the results.

## 2018-04-13 NOTE — Telephone Encounter (Signed)
Please call patient at [phone]

## 2018-04-14 ENCOUNTER — Other Ambulatory Visit (INDEPENDENT_AMBULATORY_CARE_PROVIDER_SITE_OTHER): Payer: Self-pay | Admitting: Internal Medicine

## 2018-04-14 LAB — TSH: TSH: 0.16 m[IU]/L — AB

## 2018-04-14 LAB — T3, FREE: T3, Free: 2.7 pg/mL (ref 2.3–4.2)

## 2018-04-14 LAB — T4, FREE: FREE T4: 1.5 ng/dL (ref 0.8–1.8)

## 2018-04-14 MED ORDER — FLUTICASONE PROPIONATE HFA 220 MCG/ACT IN AERO: INHALATION_SPRAY | RESPIRATORY_TRACT | 0 refills | Status: DC

## 2018-04-14 MED ORDER — MONTELUKAST SODIUM 10 MG PO TABS: 10.0000 mg | ORAL_TABLET | Freq: Every day | ORAL | 5 refills | Status: DC

## 2018-04-15 ENCOUNTER — Encounter (INDEPENDENT_AMBULATORY_CARE_PROVIDER_SITE_OTHER): Payer: Self-pay | Admitting: Internal Medicine

## 2018-04-15 ENCOUNTER — Other Ambulatory Visit (INDEPENDENT_AMBULATORY_CARE_PROVIDER_SITE_OTHER): Payer: Self-pay | Admitting: Internal Medicine

## 2018-04-15 DIAGNOSIS — K209 Esophagitis, unspecified without bleeding: Secondary | ICD-10-CM

## 2018-04-15 MED ORDER — FLUTICASONE PROPIONATE HFA 220 MCG/ACT IN AERO: INHALATION_SPRAY | RESPIRATORY_TRACT | 0 refills | Status: DC

## 2018-04-15 NOTE — Telephone Encounter (Signed)
Rx sent 

## 2018-04-17 ENCOUNTER — Encounter (HOSPITAL_COMMUNITY): Payer: Self-pay | Admitting: Internal Medicine

## 2018-04-17 ENCOUNTER — Encounter: Payer: Self-pay | Admitting: "Endocrinology

## 2018-04-20 ENCOUNTER — Other Ambulatory Visit: Payer: Self-pay | Admitting: "Endocrinology

## 2018-04-20 MED ORDER — LEVOTHYROXINE SODIUM 88 MCG PO CAPS: 88.0000 ug | ORAL_CAPSULE | Freq: Every day | ORAL | 1 refills | Status: DC

## 2018-04-20 NOTE — Telephone Encounter (Signed)
Pt wants to know if she needs a medication change before her visit. Labs results are ready

## 2018-04-24 ENCOUNTER — Ambulatory Visit: Payer: BC Managed Care – PPO | Admitting: Neurology

## 2018-04-29 ENCOUNTER — Ambulatory Visit: Payer: BC Managed Care – PPO | Admitting: Neurology

## 2018-05-20 ENCOUNTER — Ambulatory Visit (HOSPITAL_COMMUNITY)
Admission: RE | Admit: 2018-05-20 | Discharge: 2018-05-20 | Disposition: A | Discharge status: Home / Self Care | Payer: BC Managed Care – PPO | Source: Ambulatory Visit | Attending: "Endocrinology | Admitting: "Endocrinology

## 2018-05-20 DIAGNOSIS — E039 Hypothyroidism, unspecified: Secondary | ICD-10-CM | POA: Diagnosis present

## 2018-05-20 DIAGNOSIS — E034 Atrophy of thyroid (acquired): Secondary | ICD-10-CM | POA: Diagnosis not present

## 2018-05-22 ENCOUNTER — Encounter: Payer: Self-pay | Admitting: "Endocrinology

## 2018-06-26 ENCOUNTER — Ambulatory Visit (INDEPENDENT_AMBULATORY_CARE_PROVIDER_SITE_OTHER): Payer: BC Managed Care – PPO | Admitting: Neurology

## 2018-06-26 DIAGNOSIS — G43709 Chronic migraine without aura, not intractable, without status migrainosus: Secondary | ICD-10-CM

## 2018-06-26 MED ORDER — ONABOTULINUMTOXINA 100 UNITS IJ SOLR
155.0000 [IU] | Freq: Once | INTRAMUSCULAR | Status: AC
  Administered 2018-06-26: 155 [IU] via INTRAMUSCULAR

## 2018-06-26 NOTE — Progress Notes (Signed)
[facility]   Procedure Note Botox  Attending: Dr. Metta Clines  Preoperative Diagnosis(es): Chronic migraine  Consent obtained from: The patient Benefits discussed included, but were not limited to decreased muscle tightness, increased joint range of motion, and decreased pain.  Risk discussed included, but were not limited pain and discomfort, bleeding, bruising, excessive weakness, venous thrombosis, muscle atrophy and dysphagia.  Anticipated outcomes of the procedure as well as he risks and benefits of the alternatives to the procedure, and the roles and tasks of the personnel to be involved, were discussed with the patient, and the patient consents to the procedure and agrees to proceed. A copy of the patient medication guide was given to the patient which explains the blackbox warning.  Patients identity and treatment sites confirmed Yes.  .  Details of Procedure: Skin was cleaned with alcohol. Prior to injection, the needle plunger was aspirated to make sure the needle was not within a blood vessel.  There was no blood retrieved on aspiration.    Following is a summary of the muscles injected  And the amount of Botulinum toxin used:  Dilution 200 units of Botox was reconstituted with 4 ml of preservative free normal saline. Time of reconstitution: At the time of the office visit (<30 minutes prior to injection)   Injections  155 total units of Botox was injected with a 30 gauge needle.  Injection Sites: L occipitalis: 15 units- 3 sites  R occiptalis: 15 units- 3 sites  L upper trapezius: 15 units- 3 sites R upper trapezius: 15 units- 3 sits          L paraspinal: 10 units- 2 sites R paraspinal: 10 units- 2 sites  Face L frontalis(2 injection sites):10 units   R frontalis(2 injection sites):10 units         L corrugator: 5 units   R corrugator: 5 units           Procerus: 5 units   L temporalis: 20 units R temporalis: 20 units   Agent:  200 units of botulinum Type  A (Onobotulinum Toxin type A) was reconstituted with 4 ml of preservative free normal saline.  Time of reconstitution: At the time of the office visit (<30 minutes prior to injection)     Total injected (Units): 155  Total wasted (Units): 11.25  Patient tolerated procedure well without complications.   Reinjection is anticipated in 3 months. Return to clinic in 6 weeks.

## 2018-07-01 ENCOUNTER — Other Ambulatory Visit (INDEPENDENT_AMBULATORY_CARE_PROVIDER_SITE_OTHER): Payer: Self-pay | Admitting: Internal Medicine

## 2018-07-01 DIAGNOSIS — K209 Esophagitis, unspecified without bleeding: Secondary | ICD-10-CM

## 2018-07-16 ENCOUNTER — Ambulatory Visit (INDEPENDENT_AMBULATORY_CARE_PROVIDER_SITE_OTHER): Payer: BC Managed Care – PPO | Admitting: Internal Medicine

## 2018-07-16 ENCOUNTER — Encounter (INDEPENDENT_AMBULATORY_CARE_PROVIDER_SITE_OTHER): Payer: Self-pay | Admitting: Internal Medicine

## 2018-07-16 VITALS — BP 110/70 | HR 85 | Temp 98.5°F | Ht 62.0 in | Wt 132.1 lb

## 2018-07-16 DIAGNOSIS — R1310 Dysphagia, unspecified: Secondary | ICD-10-CM | POA: Diagnosis not present

## 2018-07-16 DIAGNOSIS — R1319 Other dysphagia: Secondary | ICD-10-CM

## 2018-07-16 NOTE — Progress Notes (Signed)
Subjective:    Patient ID: Laurie Cherry, female    DOB: 03-Jun-1961, 57 y.o.   MRN: [id number]  HPI Here today for f/u. Underwent and EGD/ED for therapy of esophageal stricture in June of this year. Also underwent a screening colonoscopy which was normal. She tells me her dysphagia is much better. She does occasioally have some hiccups. She know to to chew her food well.  She has lost 8 pounds which was intentional. Her appetite is good.  BMs are normal.   04/10/2018 EGD/ED: Esophageal mucosal changes consistent with eosinophilic esophagitis. Benign appearing esophageal stenosis. Dilated. 2 cm hiatal hernia. Normal stomach. Normal duodenal bulb and second portion of the duodenum.  Biopsy Eosinophilic. Started on Fluticasone 838mcg BIC x 6 weeks. And montelukast 10 mg daily.    Review of Systems Past Medical History:  Diagnosis Date  . Depression   . Esophagitis   . Family history of adverse reaction to anesthesia    Sister  . Fibroids   . GERD (gastroesophageal reflux disease)   . Hypothyroidism   . Rheumatoid arthritis ([place])   . Rheumatoid arthritis ([place]) 01/20/2017  . Worsening headaches     Past Surgical History:  Procedure Laterality Date  . ABDOMINAL HYSTERECTOMY N/A 04/03/2017   Procedure: TOTAL HYSTERECTOMY ABDOMINAL;  Surgeon: Louretta Shorten, MD;  Location: [facility];  Service: Gynecology;  Laterality: N/A;  . BIOPSY  04/10/2018   Procedure: BIOPSY;  Surgeon: Rogene Houston, MD;  Location: [facility];  Service: Endoscopy;;  esophegus   . COLONOSCOPY    . COLONOSCOPY    . COLONOSCOPY N/A 04/10/2018   Procedure: COLONOSCOPY;  Surgeon: Rogene Houston, MD;  Location: [facility];  Service: Endoscopy;  Laterality: N/A;  . ESOPHAGEAL DILATION N/A 04/10/2018   Procedure: ESOPHAGEAL DILATION;  Surgeon: Rogene Houston, MD;  Location: [facility];  Service: Endoscopy;  Laterality: N/A;  balloon dilation  . ESOPHAGOGASTRODUODENOSCOPY    . ESOPHAGOGASTRODUODENOSCOPY N/A  04/10/2018   Procedure: ESOPHAGOGASTRODUODENOSCOPY (EGD);  Surgeon: Rogene Houston, MD;  Location: [facility];  Service: Endoscopy;  Laterality: N/A;  1:25  . EYE SURGERY Right   . SALPINGOOPHORECTOMY Bilateral 04/03/2017   Procedure: SALPINGO OOPHORECTOMY;  Surgeon: Louretta Shorten, MD;  Location: [facility];  Service: Gynecology;  Laterality: Bilateral;    Allergies  Allergen Reactions  . Arava [Leflunomide] Hives  . Azithromycin Other (See Comments)    Severe bloating, abd pain  . Cefuroxime Axetil Other (See Comments)    Increased bloating, abd pain  . Clarithromycin Other (See Comments)    abd pain  . Etanercept Hives and Other (See Comments)    Injection site irritation  . Levofloxacin Hives and Other (See Comments)    Severe dizziness  . Sulfa Antibiotics Hives and Other (See Comments)    abd pain  . Methotrexate Derivatives Other (See Comments)    Injection ONLY (irritation at the site of injection)    Current Outpatient Medications on File Prior to Visit  Medication Sig Dispense Refill  . Adalimumab (HUMIRA PEN) 40 MG/0.8ML PNKT Inject 40 mg into the skin every 14 (fourteen) days. Every other week on Sundays    . BIOTIN PO Take 1 tablet by mouth daily.    . citalopram (CELEXA) 10 MG tablet Take 10 mg by mouth every evening.     Marland Kitchen estradiol (ESTRACE) 2 MG tablet Take 2 mg by mouth daily.  5  . folic acid (FOLVITE) 1 MG tablet Take 1  mg by mouth daily with lunch.    . Levothyroxine Sodium (TIROSINT) 88 MCG CAPS Take 1 capsule (88 mcg total) by mouth daily before breakfast. 90 capsule 1  . methotrexate (RHEUMATREX) 2.5 MG tablet Take 10 mg by mouth every Wednesday at 6 PM. Caution:Chemotherapy. Protect from light.     . montelukast (SINGULAIR) 10 MG tablet Take 1 tablet (10 mg total) by mouth at bedtime. 30 tablet 5  . naproxen (NAPROSYN) 500 MG tablet TAKE 1 TABLET(500 MG) BY MOUTH EVERY 12 HOURS AS NEEDED (Patient taking differently: TAKE 1 TABLET(500 MG) BY MOUTH EVERY 12 HOURS  AS NEEDED FOR MIGRAINE) 10 tablet 1  . propranolol ER (INDERAL LA) 60 MG 24 hr capsule TAKE 1 CAPSULE(60 MG) BY MOUTH DAILY 90 capsule 1  . SUMAtriptan (IMITREX) 100 MG tablet SEE NOTES (Patient taking differently: TAKE 1 TABLET (100 MG)  AT ONSET OF MIGRAINE HEADACHE AS NEEDED) 10 tablet 0  . Tetrahydrozoline-Zn Sulfate (ALLERGY RELIEF EYE DROPS OP) Place 1-2 drops into both eyes 3 (three) times daily as needed (FOR IRRITATED/ALLERGY EYES).    . Vitamin D, Ergocalciferol, (DRISDOL) 50000 units CAPS capsule Take 50,000 Units by mouth every Sunday.      No current facility-administered medications on file prior to visit.         Objective:   Physical Exam    Blood pressure 110/70, pulse 85, temperature 98.5 F (36.9 C), height 5\' 2"  (1.575 m), weight 132 lb 1.6 oz (59.9 kg), last menstrual period 03/31/2017.  Alert and oriented. Skin warm and dry. Oral mucosa is moist.   . Sclera anicteric, conjunctivae is pink. Thyroid not enlarged. No cervical lymphadenopathy. Lungs clear. Heart regular rate and rhythm.  Abdomen is soft. Bowel sounds are positive. No hepatomegaly. No abdominal masses felt. No tenderness.  No edema to lower extremities.          Assessment & Plan:  Dyshpagia:  She is doing well.  She will stop the Flonase. Start taking the Zantac or Dexilant. OV as needed.

## 2018-07-16 NOTE — Patient Instructions (Signed)
OV in 1 year.  

## 2018-07-19 IMAGING — US US TRANSVAGINAL NON-OB
1 series · 13 of 25 positions shown · non-contrast
Comparison: CT abdomen pelvis 01/30/2017

CLINICAL DATA: Pelvic mass, intermittent LEFT lower quadrant pain
for 2 months, abnormal CT

EXAM:
TRANSABDOMINAL AND TRANSVAGINAL ULTRASOUND OF PELVIS
TECHNIQUE: Both transabdominal and transvaginal ultrasound examinations of the
pelvis were performed. Transabdominal technique was performed for
global imaging of the pelvis including uterus, ovaries, adnexal
regions, and pelvic cul-de-sac. It was necessary to proceed with
endovaginal exam following the transabdominal exam to visualize the
uterus and endometrium as well as the ovaries.

[Series 1: us transvaginal non-ob · 0.25mm/px · 13 of 90 slices shown]
[im 1/90]
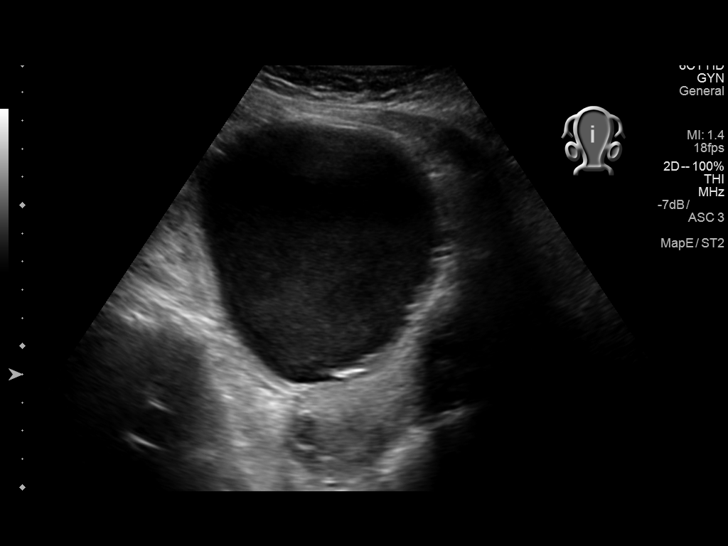
[im 8/90]
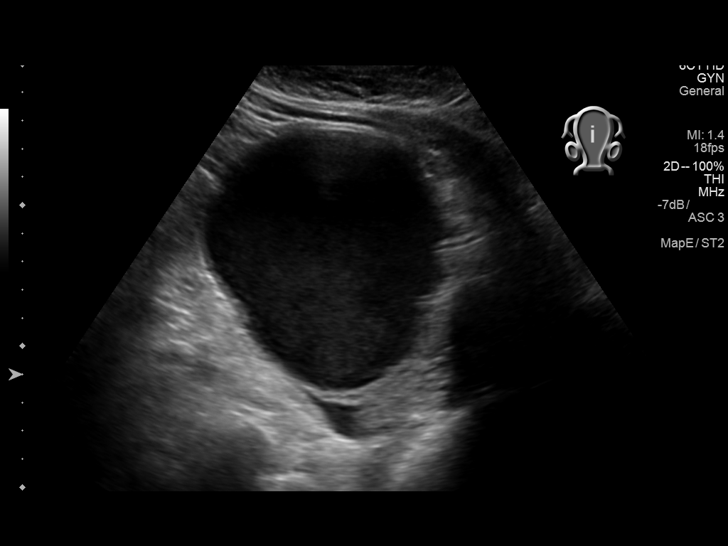
[im 15/90]
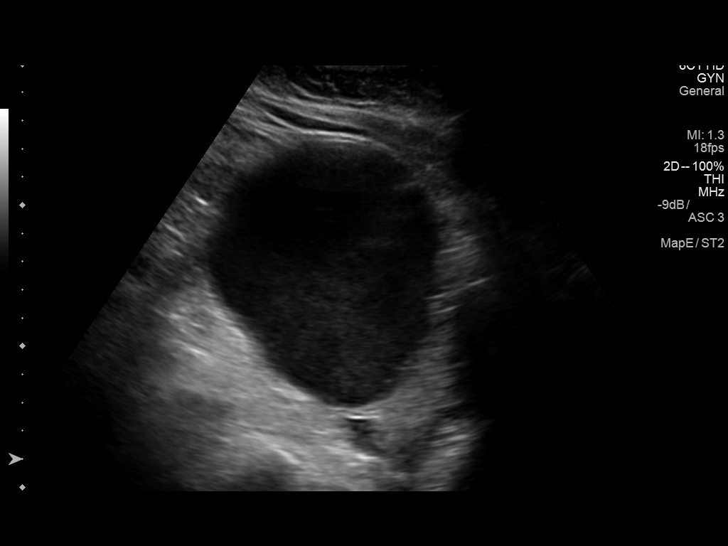
[im 23/90]
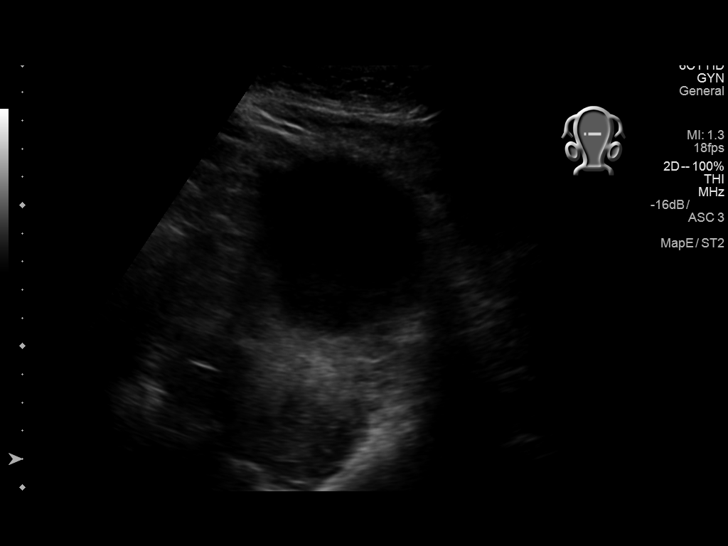
[im 30/90]
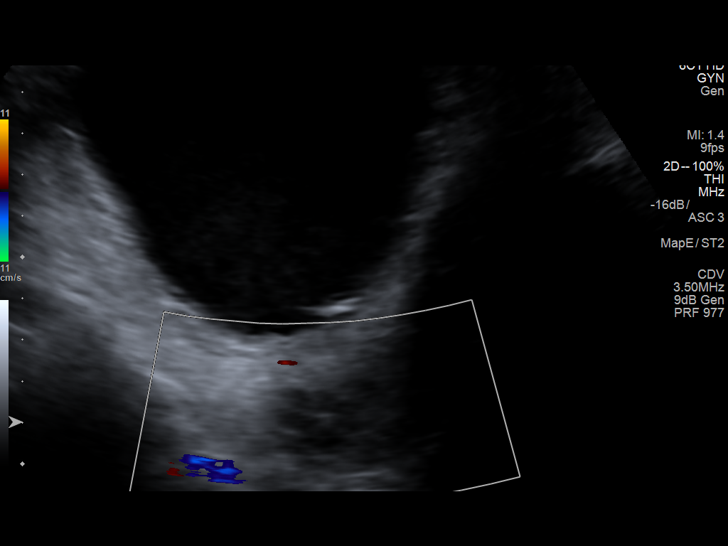
[im 38/90]
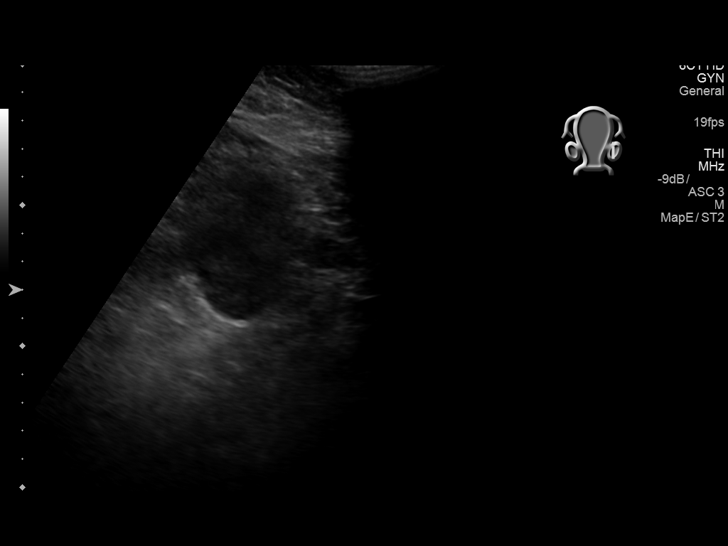
[im 45/90]
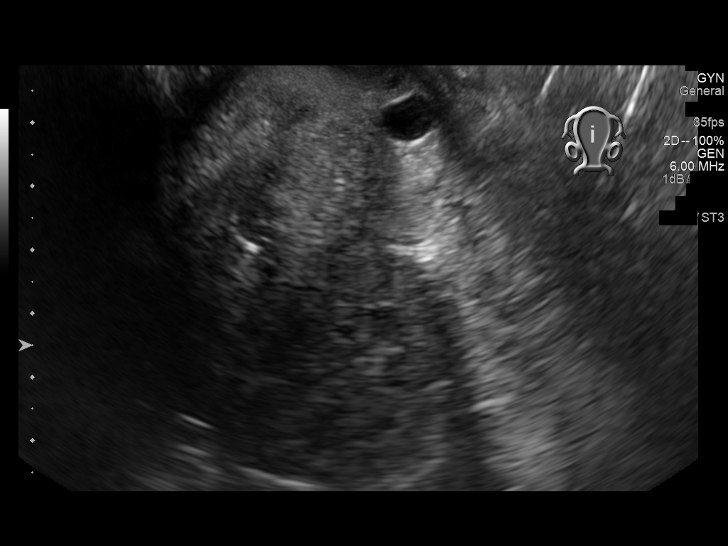
[im 52/90]
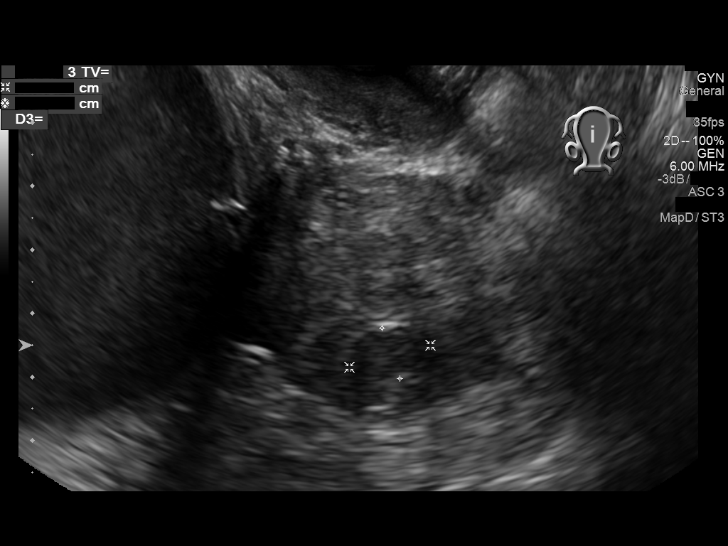
[im 60/90]
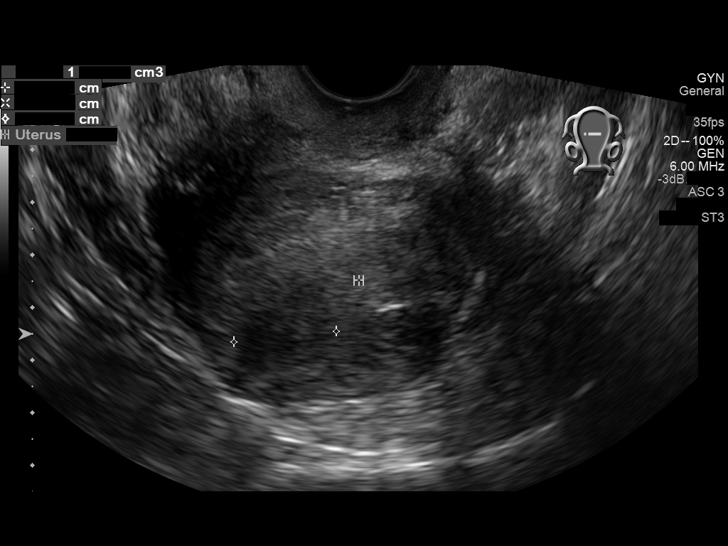
[im 67/90]
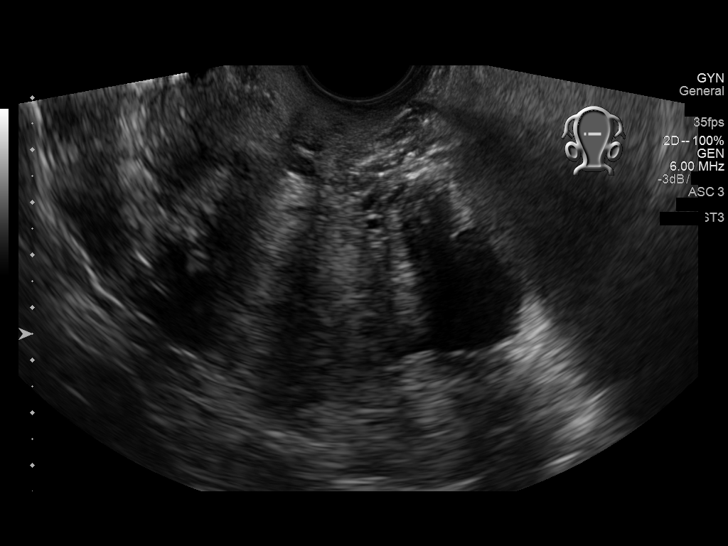
[im 75/90]
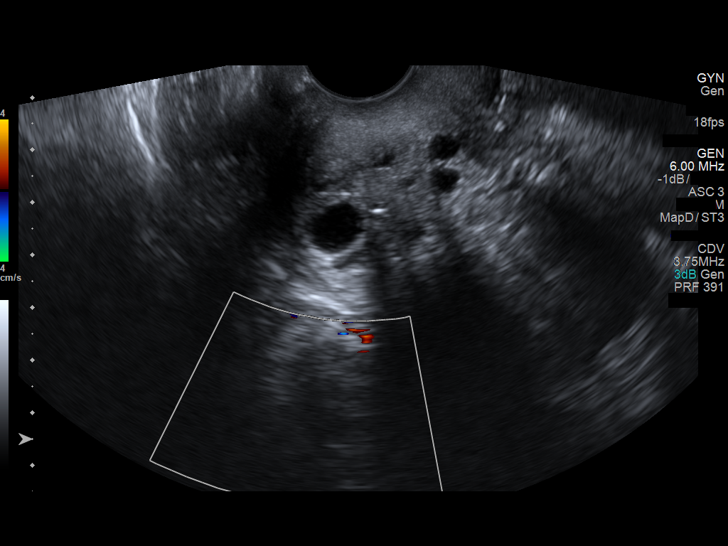
[im 82/90]
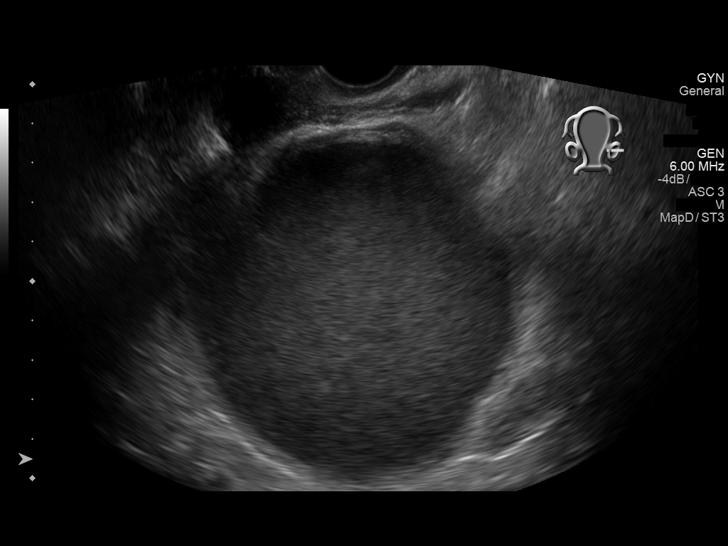
[im 90/90]
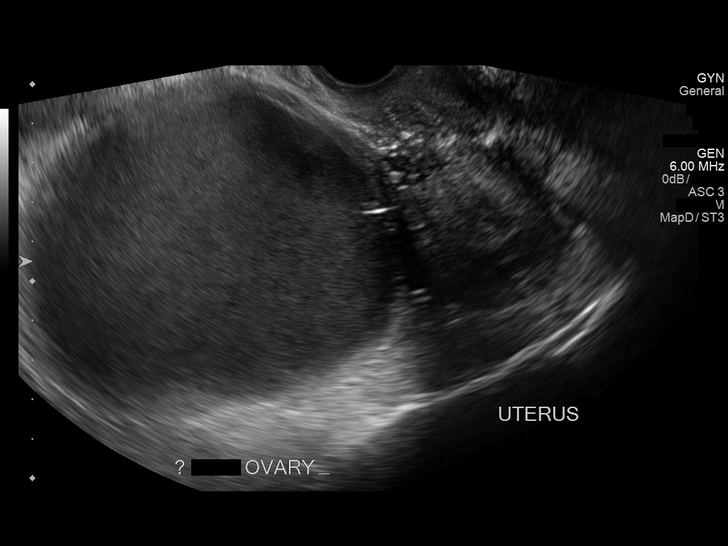

[13 of 25 positions shown; findings below may reference images not displayed]

FINDINGS: Uterus

Measurements: 7.6 x 3.5 x 4.8 cm. The nabothian cysts at cervix.
Small nodules identified compatible with leiomyomata. Two intramural
leiomyomata are seen at the fundus to the RIGHT, 13 x 8 x 20 mm and
12 x 8 x 11 mm. A third nodule is seen at the LEFT mid uterus,
subserosal, 13 x 8 x 17 mm.

Endometrium

Thickness: 5 mm thick, normal. No endometrial fluid or focal
abnormality

Right ovary

Measurements: 2.0 x 2.0 x 1.9 cm. Normal morphology without mass

Left ovary

No normal-appearing LEFT ovary visualized

Other findings

Complex cystic mass seen in anterior mid pelvis extending to LEFT,
8.3 x 9.3 x 8.2 cm, containing diffuse low level internal
echogenicity. Blood flow is seen at the margin of this lesion with
out internal blood flow on color Doppler imaging. No calcifications
or mural nodularity. Questionable thin partial septation versus
artifact at margin.

Small amount of free pelvic fluid without additional pelvic mass.
IMPRESSION: Small uterine leiomyomata.

Complex cystic mass 8.3 x 9.3 x 8.2 cm in the anterior mid to LEFT
pelvis containing diffuse low level internal echogenicity without
mural nodularity or internal blood flow. Nonvisualization of the
LEFT ovary. This is most consistent with a large endometrioma.
Consider either surgical evaluation or further characterization by
MR imaging.

## 2018-07-22 ENCOUNTER — Other Ambulatory Visit: Payer: Self-pay | Admitting: Neurology

## 2018-07-22 DIAGNOSIS — G43009 Migraine without aura, not intractable, without status migrainosus: Secondary | ICD-10-CM

## 2018-08-04 ENCOUNTER — Ambulatory Visit: Payer: BC Managed Care – PPO | Admitting: Neurology

## 2018-08-04 ENCOUNTER — Encounter: Payer: Self-pay | Admitting: Neurology

## 2018-08-04 VITALS — BP 118/70 | HR 76 | Ht 62.0 in | Wt 128.2 lb

## 2018-08-04 DIAGNOSIS — G43009 Migraine without aura, not intractable, without status migrainosus: Secondary | ICD-10-CM

## 2018-08-04 NOTE — Progress Notes (Signed)
NEUROLOGY FOLLOW UP OFFICE NOTE  CHARLYNE ROBERTSHAW [id number]  HISTORY OF PRESENT ILLNESS: Laurie Cherry is a 57 year old right-handed female with rheumatoid arthritis, hypothyroidism, reflux and mild depression who follows up for migraines.  UPDATE: Intensity:  5/10 Duration:  1 hour or less.   Frequency:  6 in 30 days.  However, she has had daily headaches since Sunday.  Stress is a probable trigger (daughter is getting married) and her back pain has flared up.   Frequency of abortive medication: 6 days a month Current NSAIDS:  Advil (for arthritis), naproxen 500mg  (infrequent) Current analgesics:  none Current triptans:  Sumatriptan 50mg  (100mg  caused drowsiness) w/wo naproxen 500mg  Current ergotamine:  none Current anti-emetic:  none Current muscle relaxants:  none Current anti-anxiolytic:  none Current sleep aide:  none Current Antihypertensive medications:  Propranolol ER 60mg  Current Antidepressant medications:  Celexa Current Anticonvulsant medications:  none Current anti-CGRP:  none Current Vitamins/Herbal/Supplements:  D Current Antihistamines/Decongestants:  none Other therapy:  Botox Hormone/birth control:  estradiol  Caffeine:  1 cup of coffee daily Alcohol:  no Smoker:  no Diet:  1/2 can of soda daily.  Drinks water Exercise:  no Depression:  stable; Anxiety:  no Other pain:  no Sleep hygiene:  varies  HISTORY: She has a history of "hormonal" migraines most of her life, described as pounding right temporal pain and often associated with her menstrual cycle. They have pretty much stabilized but she will still get it every now and then.   She was diagnosed with rheumatoid arthritis in 2014. She had been on methotrexate for about 2 years (stopped a month ago). In September 2015, she began having new headaches. It coincided with change in her thyroid medications. They are left sided, 7/10 non-throbbing pain.  They are associated with photophobia,  phonophobia, and osmophobia but not nausea.  Initially, they typically lasted 3 to 4 days and occur 1 to 2 times a week (however she also has a dull daily headache) Stress is a trigger. Change in thyroid medication may have been a trigger. These headaches do not correlate with her menstrual cycle.  Sumatriptan with naproxen helps relieve it.  Past NSAIDS:  no Past analgesics:  Excedrin, Tylenol (ineffective) Past abortive triptans:  no Past muscle relaxants:  no Past anti-emetic:  no Past antihypertensive medications:  atenolol (25mg  ineffective and higher doses caused dizziness) Past antidepressant medications:  venlafaxine XR 150mg  Past anticonvulsant medications:  topiramate 25mg  (caused drowsiness, but willing to retry it if needed) Past anti-CGRP:  no Past vitamins/Herbal/Supplements:  no Past antihistamines/decongestants:  no Other past therapies:  no  Family history of headache: Mom, sister Mother had glioblastoma  PAST MEDICAL HISTORY: Past Medical History:  Diagnosis Date  . Depression   . Esophagitis   . Family history of adverse reaction to anesthesia    Sister  . Fibroids   . GERD (gastroesophageal reflux disease)   . Hypothyroidism   . Rheumatoid arthritis ([place])   . Rheumatoid arthritis ([place]) 01/20/2017  . Worsening headaches     MEDICATIONS: Current Outpatient Medications on File Prior to Visit  Medication Sig Dispense Refill  . Adalimumab (HUMIRA PEN) 40 MG/0.8ML PNKT Inject 40 mg into the skin every 14 (fourteen) days. Every other week on Sundays    . BIOTIN PO Take 1 tablet by mouth daily.    . citalopram (CELEXA) 10 MG tablet Take 10 mg by mouth every evening.     Marland Kitchen estradiol (ESTRACE) 2 MG tablet Take 2  mg by mouth daily.  5  . folic acid (FOLVITE) 1 MG tablet Take 1 mg by mouth daily with lunch.    . Levothyroxine Sodium (TIROSINT) 88 MCG CAPS Take 1 capsule (88 mcg total) by mouth daily before breakfast. 90 capsule 1  . methotrexate (RHEUMATREX)  2.5 MG tablet Take 10 mg by mouth every Wednesday at 6 PM. Caution:Chemotherapy. Protect from light.     . montelukast (SINGULAIR) 10 MG tablet Take 1 tablet (10 mg total) by mouth at bedtime. 30 tablet 5  . naproxen (NAPROSYN) 500 MG tablet TAKE 1 TABLET(500 MG) BY MOUTH EVERY 12 HOURS AS NEEDED (Patient taking differently: TAKE 1 TABLET(500 MG) BY MOUTH EVERY 12 HOURS AS NEEDED FOR MIGRAINE) 10 tablet 1  . propranolol ER (INDERAL LA) 60 MG 24 hr capsule TAKE 1 CAPSULE(60 MG) BY MOUTH DAILY 90 capsule 1  . SUMAtriptan (IMITREX) 100 MG tablet SEE NOTES (Patient taking differently: TAKE 1 TABLET (100 MG)  AT ONSET OF MIGRAINE HEADACHE AS NEEDED) 10 tablet 0  . SUMAtriptan (IMITREX) 100 MG tablet SEE NOTES 10 tablet 0  . Tetrahydrozoline-Zn Sulfate (ALLERGY RELIEF EYE DROPS OP) Place 1-2 drops into both eyes 3 (three) times daily as needed (FOR IRRITATED/ALLERGY EYES).    . Vitamin D, Ergocalciferol, (DRISDOL) 50000 units CAPS capsule Take 50,000 Units by mouth every Sunday.      No current facility-administered medications on file prior to visit.     ALLERGIES: Allergies  Allergen Reactions  . Arava [Leflunomide] Hives  . Azithromycin Other (See Comments)    Severe bloating, abd pain  . Cefuroxime Axetil Other (See Comments)    Increased bloating, abd pain  . Clarithromycin Other (See Comments)    abd pain  . Etanercept Hives and Other (See Comments)    Injection site irritation  . Levofloxacin Hives and Other (See Comments)    Severe dizziness  . Sulfa Antibiotics Hives and Other (See Comments)    abd pain  . Methotrexate Derivatives Other (See Comments)    Injection ONLY (irritation at the site of injection)    FAMILY HISTORY: Family History  Problem Relation Age of Onset  . Thyroid disease Mother   . Heart attack Mother   . Stroke Mother   . Diabetes Mother   . Brain cancer Mother   . Colon cancer Father   . Heart attack Sister   . Hypertension Sister   . Hypertension  Brother    SOCIAL HISTORY: Social History   Socioeconomic History  . Marital status: Married    Spouse name: Not on file  . Number of children: Not on file  . Years of education: Not on file  . Highest education level: Not on file  Occupational History  . Not on file  Social Needs  . Financial resource strain: Not on file  . Food insecurity:    Worry: Not on file    Inability: Not on file  . Transportation needs:    Medical: Not on file    Non-medical: Not on file  Tobacco Use  . Smoking status: Never Smoker  . Smokeless tobacco: Never Used  Substance and Sexual Activity  . Alcohol use: No    Alcohol/week: 0.0 standard drinks  . Drug use: No  . Sexual activity: Not on file  Lifestyle  . Physical activity:    Days per week: Not on file    Minutes per session: Not on file  . Stress: Not on file  Relationships  .  Social connections:    Talks on phone: Not on file    Gets together: Not on file    Attends religious service: Not on file    Active member of club or organization: Not on file    Attends meetings of clubs or organizations: Not on file    Relationship status: Not on file  . Intimate partner violence:    Fear of current or ex partner: Not on file    Emotionally abused: Not on file    Physically abused: Not on file    Forced sexual activity: Not on file  Other Topics Concern  . Not on file  Social History Narrative  . Not on file    REVIEW OF SYSTEMS: Constitutional: No fevers, chills, or sweats, no generalized fatigue, change in appetite Eyes: No visual changes, double vision, eye pain Ear, nose and throat: No hearing loss, ear pain, nasal congestion, sore throat Cardiovascular: No chest pain, palpitations Respiratory:  No shortness of breath at rest or with exertion, wheezes GastrointestinaI: No nausea, vomiting, diarrhea, abdominal pain, fecal incontinence Genitourinary:  No dysuria, urinary retention or frequency Musculoskeletal:  back  pain Integumentary: No rash, pruritus, skin lesions Neurological: as above Psychiatric: No depression, insomnia, anxiety Endocrine: No palpitations, fatigue, diaphoresis, mood swings, change in appetite, change in weight, increased thirst Hematologic/Lymphatic:  No purpura, petechiae. Allergic/Immunologic: no itchy/runny eyes, nasal congestion, recent allergic reactions, rashes  PHYSICAL EXAM: Blood pressure 118/70, pulse 76, height 5\' 2"  (1.575 m), weight 128 lb 4 oz (58.2 kg), last menstrual period 03/31/2017, SpO2 98 %. General: No acute distress.  Patient appears well-groomed.   Head:  Normocephalic/atraumatic Eyes:  Fundi examined but not visualized Neck: supple, no paraspinal tenderness, full range of motion Heart:  Regular rate and rhythm Lungs:  Clear to auscultation bilaterally Back: No paraspinal tenderness Neurological Exam: alert and oriented to person, place, and time. Attention span and concentration intact, recent and remote memory intact, fund of knowledge intact.  Speech fluent and not dysarthric, language intact.  CN II-XII intact. Bulk and tone normal, muscle strength 5/5 throughout.  Sensation to light touch, temperature and vibration intact.  Deep tendon reflexes 2+ throughout, toes downgoing.  Finger to nose and heel to shin testing intact.  Gait normal, Romberg negative.  IMPRESSION:  migraine without aura, without status migrainosus, not intractable.  Slight increased frequency likely secondary to stress related to planning daughter's wedding.  PLAN: 1.  For preventative management, continue propranolol ER 60mg  daily and Botox every 3 months.  If headaches seem more frequent after the wedding, consider increasing propranolol ER to 80mg  daily. 2.  For abortive therapy, sumatriptan and naproxen 3.  Limit use of pain relievers to no more than 2 days out of week to prevent risk of rebound or medication-overuse headache. 4.  Keep headache diary 5.  Exercise, hydration,  caffeine cessation, sleep hygiene, monitor for and avoid triggers 6.  Consider:  magnesium citrate 400mg  daily, riboflavin 400mg  daily, and coenzyme Q10 100mg  three times daily 7.  Follow up for next Botox in late December-early January. Follow up for next office visit in 6 months.  Metta Clines, DO  CC:  Ephriam Jenkins

## 2018-08-14 ENCOUNTER — Ambulatory Visit: Payer: BC Managed Care – PPO | Admitting: "Endocrinology

## 2018-08-21 ENCOUNTER — Other Ambulatory Visit: Payer: Self-pay | Admitting: Neurology

## 2018-08-21 DIAGNOSIS — G43009 Migraine without aura, not intractable, without status migrainosus: Secondary | ICD-10-CM

## 2018-09-02 ENCOUNTER — Ambulatory Visit: Payer: BC Managed Care – PPO | Admitting: "Endocrinology

## 2018-09-21 ENCOUNTER — Telehealth (INDEPENDENT_AMBULATORY_CARE_PROVIDER_SITE_OTHER): Payer: Self-pay | Admitting: Internal Medicine

## 2018-09-21 NOTE — Telephone Encounter (Signed)
Laurie Cherry, this needs to go to [place],

## 2018-09-21 NOTE — Telephone Encounter (Signed)
Patient called stated she is still having some trouble swallowing even after procedure - please call her at [phone]

## 2018-09-21 NOTE — Telephone Encounter (Signed)
Patient called stated she is still having problems swallowing even after her procedure  - please call her at [phone]

## 2018-09-22 ENCOUNTER — Encounter: Payer: Self-pay | Admitting: *Deleted

## 2018-09-22 NOTE — Progress Notes (Signed)
Submitted Botox PA online, cover my meds, KEY: [id number]

## 2018-09-23 NOTE — Telephone Encounter (Signed)
I spoke with Dr.Rehman about this on 11/26/219.

## 2018-09-25 NOTE — Telephone Encounter (Signed)
Patient's call returned. Patient underwent EGD with the ED in June this year. 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 Has had typical changes of eosinophilic esophagitis confirmed on biopsy. He was treated with fluticasone. He was doing well when she was seen in the office in September this year. She has had few episodes of dysphagia this month. She remains on montelukast. Proceed with barium study with a pill;  Please try to schedule it on Tuesday if possible or in the afternoon other days of the week.

## 2018-09-28 ENCOUNTER — Other Ambulatory Visit: Payer: Self-pay | Admitting: Neurology

## 2018-09-28 ENCOUNTER — Other Ambulatory Visit (INDEPENDENT_AMBULATORY_CARE_PROVIDER_SITE_OTHER): Payer: Self-pay | Admitting: *Deleted

## 2018-09-28 DIAGNOSIS — G43709 Chronic migraine without aura, not intractable, without status migrainosus: Secondary | ICD-10-CM

## 2018-09-28 DIAGNOSIS — R1310 Dysphagia, unspecified: Secondary | ICD-10-CM

## 2018-09-28 MED ORDER — ONABOTULINUMTOXINA 100 UNITS IJ SOLR: 200.0000 [IU] | Freq: Once | INTRAMUSCULAR | 3 refills | Status: AC

## 2018-09-28 NOTE — Telephone Encounter (Signed)
BPE sch'd 09/29/18 at 945, npo 3 hrs, left detailed message for patient

## 2018-09-28 NOTE — Progress Notes (Addendum)
Authorized 09/22/2018-09/23/2019 must use [facility] for Botox. Faxed prescription 09/28/2018. CAlled pt amd LMOM both 12/2 in the afternoon and 12/4 in am. Left her the phone number and account number and RX [facility]

## 2018-09-29 ENCOUNTER — Encounter (INDEPENDENT_AMBULATORY_CARE_PROVIDER_SITE_OTHER): Payer: Self-pay

## 2018-09-29 ENCOUNTER — Ambulatory Visit (HOSPITAL_COMMUNITY)
Admission: RE | Admit: 2018-09-29 | Discharge: 2018-09-29 | Disposition: A | Discharge status: Home / Self Care | Payer: BC Managed Care – PPO | Source: Ambulatory Visit | Attending: Internal Medicine | Admitting: Internal Medicine

## 2018-09-29 ENCOUNTER — Other Ambulatory Visit: Payer: Self-pay | Admitting: "Endocrinology

## 2018-09-29 DIAGNOSIS — R1310 Dysphagia, unspecified: Secondary | ICD-10-CM | POA: Diagnosis not present

## 2018-09-29 DIAGNOSIS — E039 Hypothyroidism, unspecified: Secondary | ICD-10-CM

## 2018-09-29 NOTE — Progress Notes (Signed)
Calleed [facility] at [phone], spoke with [place]. Rx has been rcvd, has been processed, they are trying to reach Pt. He suggested I contact Pt and ask her to call them for permission to ship.  Called Pt, LMOVM advising her to call [facility] to have [facility] shipped

## 2018-09-30 ENCOUNTER — Telehealth: Payer: Self-pay | Admitting: Neurology

## 2018-09-30 ENCOUNTER — Telehealth: Payer: Self-pay | Admitting: *Deleted

## 2018-09-30 ENCOUNTER — Encounter (INDEPENDENT_AMBULATORY_CARE_PROVIDER_SITE_OTHER): Payer: Self-pay

## 2018-09-30 LAB — T4, FREE: FREE T4: 1.2 ng/dL (ref 0.8–1.8)

## 2018-09-30 LAB — T3, FREE: T3 FREE: 2.4 pg/mL (ref 2.3–4.2)

## 2018-09-30 LAB — TSH: TSH: 0.61 mIU/L

## 2018-09-30 NOTE — Telephone Encounter (Signed)
Patient left a vm returning your call and said that [facility] was working on the [employer] and that also the copay assistance # didn't go through. Please call her back at [phone]. Thanks!

## 2018-09-30 NOTE — Telephone Encounter (Signed)
Error this message is on documentation

## 2018-10-01 ENCOUNTER — Other Ambulatory Visit (INDEPENDENT_AMBULATORY_CARE_PROVIDER_SITE_OTHER): Payer: Self-pay | Admitting: Internal Medicine

## 2018-10-01 MED ORDER — FLUTICASONE PROPIONATE HFA 220 MCG/ACT IN AERO: 4.0000 | INHALATION_SPRAY | Freq: Two times a day (BID) | RESPIRATORY_TRACT | 0 refills | Status: DC

## 2018-10-02 ENCOUNTER — Telehealth: Payer: Self-pay

## 2018-10-02 ENCOUNTER — Ambulatory Visit: Payer: BC Managed Care – PPO | Admitting: Neurology

## 2018-10-02 NOTE — Telephone Encounter (Signed)
Pt called to see if she should come for Botox, but call was disconnected before I could get to the line. I called her back, I advised her we had not rcvd the botox shipment. Pt remembered then that she was having trouble with the Botox reimbursement card. I advised her I will have Manuela Schwartz call her with that information again.

## 2018-10-06 ENCOUNTER — Other Ambulatory Visit (INDEPENDENT_AMBULATORY_CARE_PROVIDER_SITE_OTHER): Payer: Self-pay | Admitting: *Deleted

## 2018-10-06 ENCOUNTER — Ambulatory Visit: Payer: BC Managed Care – PPO | Admitting: "Endocrinology

## 2018-10-06 DIAGNOSIS — R1310 Dysphagia, unspecified: Secondary | ICD-10-CM

## 2018-10-07 ENCOUNTER — Other Ambulatory Visit (INDEPENDENT_AMBULATORY_CARE_PROVIDER_SITE_OTHER): Payer: Self-pay | Admitting: *Deleted

## 2018-10-07 DIAGNOSIS — R1310 Dysphagia, unspecified: Secondary | ICD-10-CM

## 2018-10-08 DIAGNOSIS — R1310 Dysphagia, unspecified: Secondary | ICD-10-CM | POA: Insufficient documentation

## 2018-10-09 ENCOUNTER — Encounter (INDEPENDENT_AMBULATORY_CARE_PROVIDER_SITE_OTHER): Payer: Self-pay

## 2018-10-12 ENCOUNTER — Ambulatory Visit (INDEPENDENT_AMBULATORY_CARE_PROVIDER_SITE_OTHER): Payer: BC Managed Care – PPO | Admitting: "Endocrinology

## 2018-10-12 ENCOUNTER — Encounter: Payer: Self-pay | Admitting: "Endocrinology

## 2018-10-12 VITALS — BP 136/79 | HR 76 | Ht 62.0 in | Wt 128.0 lb

## 2018-10-12 DIAGNOSIS — E039 Hypothyroidism, unspecified: Secondary | ICD-10-CM | POA: Diagnosis not present

## 2018-10-12 MED ORDER — LEVOTHYROXINE SODIUM 88 MCG PO CAPS: 88.0000 ug | ORAL_CAPSULE | Freq: Every day | ORAL | 4 refills | Status: DC

## 2018-10-12 NOTE — Progress Notes (Signed)
Endocrinology follow-up note   Subjective:    Patient ID: Laurie Cherry, female    DOB: 03/08/1961, PCP Thea Alken   Past Medical History:  Diagnosis Date  . Depression   . Esophagitis   . Family history of adverse reaction to anesthesia    Sister  . Fibroids   . GERD (gastroesophageal reflux disease)   . Hypothyroidism   . Rheumatoid arthritis ([place])   . Rheumatoid arthritis ([place]) 01/20/2017  . Worsening headaches    Past Surgical History:  Procedure Laterality Date  . ABDOMINAL HYSTERECTOMY N/A 04/03/2017   Procedure: TOTAL HYSTERECTOMY ABDOMINAL;  Surgeon: Louretta Shorten, MD;  Location: [facility];  Service: Gynecology;  Laterality: N/A;  . BIOPSY  04/10/2018   Procedure: BIOPSY;  Surgeon: Rogene Houston, MD;  Location: [facility];  Service: Endoscopy;;  esophegus   . COLONOSCOPY    . COLONOSCOPY    . COLONOSCOPY N/A 04/10/2018   Procedure: COLONOSCOPY;  Surgeon: Rogene Houston, MD;  Location: [facility];  Service: Endoscopy;  Laterality: N/A;  . ESOPHAGEAL DILATION N/A 04/10/2018   Procedure: ESOPHAGEAL DILATION;  Surgeon: Rogene Houston, MD;  Location: [facility];  Service: Endoscopy;  Laterality: N/A;  balloon dilation  . ESOPHAGOGASTRODUODENOSCOPY    . ESOPHAGOGASTRODUODENOSCOPY N/A 04/10/2018   Procedure: ESOPHAGOGASTRODUODENOSCOPY (EGD);  Surgeon: Rogene Houston, MD;  Location: [facility];  Service: Endoscopy;  Laterality: N/A;  1:25  . EYE SURGERY Right   . SALPINGOOPHORECTOMY Bilateral 04/03/2017   Procedure: SALPINGO OOPHORECTOMY;  Surgeon: Louretta Shorten, MD;  Location: [facility];  Service: Gynecology;  Laterality: Bilateral;   Social History   Socioeconomic History  . Marital status: Married    Spouse name: Not on file  . Number of children: Not on file  . Years of education: Not on file  . Highest education level: Not on file  Occupational History  . Not on file  Social Needs  . Financial resource strain: Not on file  . Food insecurity:     Worry: Not on file    Inability: Not on file  . Transportation needs:    Medical: Not on file    Non-medical: Not on file  Tobacco Use  . Smoking status: Never Smoker  . Smokeless tobacco: Never Used  Substance and Sexual Activity  . Alcohol use: No    Alcohol/week: 0.0 standard drinks  . Drug use: No  . Sexual activity: Not on file  Lifestyle  . Physical activity:    Days per week: Not on file    Minutes per session: Not on file  . Stress: Not on file  Relationships  . Social connections:    Talks on phone: Not on file    Gets together: Not on file    Attends religious service: Not on file    Active member of club or organization: Not on file    Attends meetings of clubs or organizations: Not on file    Relationship status: Not on file  Other Topics Concern  . Not on file  Social History Narrative  . Not on file   Outpatient Encounter Medications as of 10/12/2018  Medication Sig  . Adalimumab (HUMIRA PEN) 40 MG/0.8ML PNKT Inject 40 mg into the skin every 14 (fourteen) days. Every other week on Sundays  . BIOTIN PO Take 1 tablet by mouth daily.  . citalopram (CELEXA) 10 MG tablet Take 10 mg by mouth every evening.   Marland Kitchen estradiol (ESTRACE)  2 MG tablet Take 2 mg by mouth daily.  . fluticasone (FLOVENT HFA) 220 MCG/ACT inhaler Inhale 4 puffs into the lungs 2 (two) times daily.  . folic acid (FOLVITE) 1 MG tablet Take 1 mg by mouth daily with lunch.  . Levothyroxine Sodium (TIROSINT) 88 MCG CAPS Take 1 capsule (88 mcg total) by mouth daily before breakfast.  . methotrexate (RHEUMATREX) 2.5 MG tablet Take 10 mg by mouth every Wednesday at 6 PM. Caution:Chemotherapy. Protect from light.   . montelukast (SINGULAIR) 10 MG tablet Take 1 tablet (10 mg total) by mouth at bedtime.  . naproxen (NAPROSYN) 500 MG tablet TAKE 1 TABLET(500 MG) BY MOUTH EVERY 12 HOURS AS NEEDED (Patient taking differently: TAKE 1 TABLET(500 MG) BY MOUTH EVERY 12 HOURS AS NEEDED FOR MIGRAINE)  .  propranolol ER (INDERAL LA) 60 MG 24 hr capsule TAKE 1 CAPSULE(60 MG) BY MOUTH DAILY  . SUMAtriptan (IMITREX) 100 MG tablet SEE NOTES (Patient taking differently: TAKE 1 TABLET (100 MG)  AT ONSET OF MIGRAINE HEADACHE AS NEEDED)  . Tetrahydrozoline-Zn Sulfate (ALLERGY RELIEF EYE DROPS OP) Place 1-2 drops into both eyes 3 (three) times daily as needed (FOR IRRITATED/ALLERGY EYES).  . Vitamin D, Ergocalciferol, (DRISDOL) 50000 units CAPS capsule Take 50,000 Units by mouth every Sunday.   . [DISCONTINUED] Levothyroxine Sodium (TIROSINT) 88 MCG CAPS Take 1 capsule (88 mcg total) by mouth daily before breakfast.  . [DISCONTINUED] SUMAtriptan (IMITREX) 100 MG tablet SEE NOTES   No facility-administered encounter medications on file as of 10/12/2018.    ALLERGIES: Allergies  Allergen Reactions  . Arava [Leflunomide] Hives  . Azithromycin Other (See Comments)    Severe bloating, abd pain  . Cefuroxime Axetil Other (See Comments)    Increased bloating, abd pain  . Clarithromycin Other (See Comments)    abd pain  . Etanercept Hives and Other (See Comments)    Injection site irritation  . Levofloxacin Hives and Other (See Comments)    Severe dizziness  . Sulfa Antibiotics Hives and Other (See Comments)    abd pain  . Methotrexate Derivatives Other (See Comments)    Injection ONLY (irritation at the site of injection)   VACCINATION STATUS:  There is no immunization history on file for this patient.  HPI  57 year old female patient with medical history as above. She is being seen in follow-up for longstanding hypothyroidism.      She states that she was diagnosed with hypothyroidism approximately at age 18. -She is currently on Tirosint 88 mcg p.o. every morning.  This is due to her intolerance to regular levothyroxine, Synthroid.  She reports compliance to this medication. She denies new complaints today.   - She has family  history of thyroid dysfunction in multiple members of her  family.  She denies any history of goiter. She denies any family history of thyroid cancer. - She underwent total abdominal hysterectomy/self-injurious oophorectomy due to a large ovarian cyst. Findings were reportedly benign.  -She is on multiple medications for her rheumatoid arthritis.   Review of Systems  Constitutional:  +  Weight loss, - fatigue,  -subjective hypothermia Eyes: no blurry vision, no xerophthalmia ENT: no sore throat, no nodules palpated in throat, no dysphagia/odynophagia, no hoarseness Musculoskeletal: no muscle/joint aches Skin: no rashes Neurological: no tremors/numbness/tingling/dizziness Psychiatric: no depression/anxiety   Objective:    BP 136/79   Pulse 76   Ht 5\' 2"  (1.575 m)   Wt 128 lb (58.1 kg)   LMP 03/31/2017 Comment: scant spotting x  3 days  BMI 23.41 kg/m   Wt Readings from Last 3 Encounters:  10/12/18 128 lb (58.1 kg)  08/04/18 128 lb 4 oz (58.2 kg)  07/16/18 132 lb 1.6 oz (59.9 kg)    Physical Exam  Constitutional:  in NAD Eyes: PERRLA, EOMI, no exophthalmos ENT: moist mucous membranes, no thyromegaly, no cervical lymphadenopathy Musculoskeletal: no deformities, strength intact in all 4 Skin: moist, warm, no rashes Neurological: no tremor with outstretched hands, DTR normal in all 4  Recent Results (from the past 2160 hour(s))  T3, Free     Status: None   Collection Time: 09/29/18 12:08 PM  Result Value Ref Range   T3, Free 2.4 2.3 - 4.2 pg/mL  TSH     Status: None   Collection Time: 09/29/18 12:08 PM  Result Value Ref Range   TSH 0.61 mIU/L    Comment:           Reference Range .           > or = 20 Years  0.40-4.50 .                Pregnancy Ranges           First trimester    0.26-2.66           Second trimester   0.55-2.73           Third trimester    0.43-2.91   T4, Free     Status: None   Collection Time: 09/29/18 12:08 PM  Result Value Ref Range   Free T4 1.2 0.8 - 1.8 ng/dL    - On 08/16/2016 her free T4 was  1.36, improving from prior studies. - On 05/09/2016: Free T4 0.63, TSH 7.58, TPO antibodies 13  Assessment & Plan:   1.  hypothyroidism - Her thyroid function tests are consistent with appropriate replacement.  Her dose of Tirosint was lowered to 88 mcg in the interim due to significant weight loss.  She is advised to continue Tirosint 88 mcg p.o. daily before breakfast.    - We discussed about correct intake of levothyroxine, at fasting, with water, separated by at least 30 minutes from breakfast, and separated by more than 4 hours from calcium, iron, multivitamins, acid reflux medications (PPIs). -Patient is made aware of the fact that thyroid hormone replacement is needed for life, dose to be adjusted by periodic monitoring of thyroid function tests.  -She has no clinical goiter, or thyroid ultrasound from July 2019 was unremarkable except for shrunk bilateral thyroid lobes.    - I advised patient to maintain close follow up with Ephriam Jenkins E for primary care needs. Follow up plan: Return in about 1 year (around 10/13/2019) for Follow up with Pre-visit Labs.  Glade Lloyd, MD Phone: [phone]  Fax: [phone]   10/12/2018, 1:33 PM

## 2018-10-19 ENCOUNTER — Encounter (HOSPITAL_COMMUNITY): Payer: Self-pay | Admitting: *Deleted

## 2018-10-19 ENCOUNTER — Ambulatory Visit (HOSPITAL_COMMUNITY)
Admission: RE | Admit: 2018-10-19 | Discharge: 2018-10-19 | Disposition: A | Discharge status: Home / Self Care | Payer: BC Managed Care – PPO | Attending: Internal Medicine | Admitting: Internal Medicine

## 2018-10-19 ENCOUNTER — Encounter (HOSPITAL_COMMUNITY)
Admission: RE | Disposition: A | Discharge status: Home / Self Care | Payer: Self-pay | Source: Home / Self Care | Attending: Internal Medicine

## 2018-10-19 ENCOUNTER — Other Ambulatory Visit: Payer: Self-pay

## 2018-10-19 DIAGNOSIS — Z7951 Long term (current) use of inhaled steroids: Secondary | ICD-10-CM | POA: Diagnosis not present

## 2018-10-19 DIAGNOSIS — K222 Esophageal obstruction: Secondary | ICD-10-CM | POA: Insufficient documentation

## 2018-10-19 DIAGNOSIS — K210 Gastro-esophageal reflux disease with esophagitis: Secondary | ICD-10-CM

## 2018-10-19 DIAGNOSIS — K228 Other specified diseases of esophagus: Secondary | ICD-10-CM | POA: Diagnosis not present

## 2018-10-19 DIAGNOSIS — Z79899 Other long term (current) drug therapy: Secondary | ICD-10-CM | POA: Diagnosis not present

## 2018-10-19 DIAGNOSIS — Z79890 Hormone replacement therapy (postmenopausal): Secondary | ICD-10-CM | POA: Diagnosis not present

## 2018-10-19 DIAGNOSIS — E039 Hypothyroidism, unspecified: Secondary | ICD-10-CM | POA: Insufficient documentation

## 2018-10-19 DIAGNOSIS — K449 Diaphragmatic hernia without obstruction or gangrene: Secondary | ICD-10-CM

## 2018-10-19 DIAGNOSIS — R1310 Dysphagia, unspecified: Secondary | ICD-10-CM | POA: Diagnosis present

## 2018-10-19 DIAGNOSIS — F329 Major depressive disorder, single episode, unspecified: Secondary | ICD-10-CM | POA: Insufficient documentation

## 2018-10-19 DIAGNOSIS — R1314 Dysphagia, pharyngoesophageal phase: Secondary | ICD-10-CM

## 2018-10-19 HISTORY — PX: BIOPSY: SHX5522

## 2018-10-19 HISTORY — PX: ESOPHAGOGASTRODUODENOSCOPY: SHX5428

## 2018-10-19 HISTORY — PX: ESOPHAGEAL DILATION: SHX303

## 2018-10-19 SURGERY — EGD (ESOPHAGOGASTRODUODENOSCOPY)
Anesthesia: Moderate Sedation

## 2018-10-19 MED ORDER — LIDOCAINE VISCOUS HCL 2 % MT SOLN
OROMUCOSAL | Status: DC | PRN
  Administered 2018-10-19: 4 mL via OROMUCOSAL

## 2018-10-19 MED ORDER — MIDAZOLAM HCL 5 MG/5ML IJ SOLN
INTRAMUSCULAR | Status: DC | PRN
  Administered 2018-10-19: 1 mg via INTRAVENOUS
  Administered 2018-10-19 (×2): 2 mg via INTRAVENOUS

## 2018-10-19 MED ORDER — MIDAZOLAM HCL 5 MG/5ML IJ SOLN
INTRAMUSCULAR | Status: AC
  Filled 2018-10-19: qty 10

## 2018-10-19 MED ORDER — CIMETIDINE 400 MG PO TABS: 400.0000 mg | ORAL_TABLET | Freq: Two times a day (BID) | ORAL | 5 refills | Status: DC

## 2018-10-19 MED ORDER — SODIUM CHLORIDE 0.9 % IV SOLN
INTRAVENOUS | Status: DC
  Administered 2018-10-19: 07:00:00 via INTRAVENOUS

## 2018-10-19 MED ORDER — MEPERIDINE HCL 50 MG/ML IJ SOLN
INTRAMUSCULAR | Status: DC | PRN
  Administered 2018-10-19 (×2): 25 mg via INTRAVENOUS

## 2018-10-19 MED ORDER — STERILE WATER FOR IRRIGATION IR SOLN
Status: DC | PRN
  Administered 2018-10-19: 08:00:00

## 2018-10-19 MED ORDER — LIDOCAINE VISCOUS HCL 2 % MT SOLN
OROMUCOSAL | Status: AC
  Filled 2018-10-19: qty 15

## 2018-10-19 MED ORDER — MEPERIDINE HCL 50 MG/ML IJ SOLN
INTRAMUSCULAR | Status: AC
  Filled 2018-10-19: qty 1

## 2018-10-19 NOTE — H&P (Signed)
Laurie Cherry is an 57 y.o. female.   Chief Complaint: Patient is here for EGD and ED. HPI: Patient is 57 year old Caucasian female was chronic GERD and has been intolerant of most medications because of headache who underwent EGD in June this year for dysphagia.  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  Stricture was dilated with a balloon to 18 mm.  Patient was treated with fluticasone for 6 weeks.  She remained free of dysphagia for about 3 months.  Since then she has been having intermittent dysphagia with solids and few episodes of food impaction relieved spontaneously.  She was reevaluated with barium pill esophagogram which suggested esophageal dysmotility mild esophageal stricture lying passage of barium pill distally.  She was started back on fluticasone.  She is therefore returning for EGD with dilation and esophageal biopsy to confirm that EOE he has been eradicated. She is trying OTC Tagamet for heartburn.  She says some lower dose and is not working.  She denies weight loss epigastric pain or melena. She took Aleve yesterday.  Past Medical History:  Diagnosis Date  . Depression   . Esophagitis   . Family history of adverse reaction to anesthesia    Sister  . Fibroids   . GERD (gastroesophageal reflux disease)   . Hypothyroidism   . Rheumatoid arthritis ([place])   . Rheumatoid arthritis ([place]) 01/20/2017  . Worsening headaches     Past Surgical History:  Procedure Laterality Date  . ABDOMINAL HYSTERECTOMY N/A 04/03/2017   Procedure: TOTAL HYSTERECTOMY ABDOMINAL;  Surgeon: Louretta Shorten, MD;  Location: [facility];  Service: Gynecology;  Laterality: N/A;  . BIOPSY  04/10/2018   Procedure: BIOPSY;  Surgeon: Rogene Houston, MD;  Location: [facility];  Service: Endoscopy;;  esophegus   . COLONOSCOPY    . COLONOSCOPY    . COLONOSCOPY N/A 04/10/2018   Procedure: COLONOSCOPY;  Surgeon: Rogene Houston, MD;   Location: [facility];  Service: Endoscopy;  Laterality: N/A;  . ESOPHAGEAL DILATION N/A 04/10/2018   Procedure: ESOPHAGEAL DILATION;  Surgeon: Rogene Houston, MD;  Location: [facility];  Service: Endoscopy;  Laterality: N/A;  balloon dilation  . ESOPHAGOGASTRODUODENOSCOPY    . ESOPHAGOGASTRODUODENOSCOPY N/A 04/10/2018   Procedure: ESOPHAGOGASTRODUODENOSCOPY (EGD);  Surgeon: Rogene Houston, MD;  Location: [facility];  Service: Endoscopy;  Laterality: N/A;  1:25  . EYE SURGERY Right   . SALPINGOOPHORECTOMY Bilateral 04/03/2017   Procedure: SALPINGO OOPHORECTOMY;  Surgeon: Louretta Shorten, MD;  Location: [facility];  Service: Gynecology;  Laterality: Bilateral;    Family History  Problem Relation Age of Onset  . Thyroid disease Mother   . Heart attack Mother   . Stroke Mother   . Diabetes Mother   . Brain cancer Mother   . Colon cancer Father   . Heart attack Sister   . Hypertension Sister   . Hypertension Brother    Social History:  reports that she has never smoked. She has never used smokeless tobacco. She reports that she does not drink alcohol or use drugs.  Allergies:  Allergies  Allergen Reactions  . Arava [Leflunomide] Hives  . Azithromycin Other (See Comments)    Severe bloating, abd pain  . Cefuroxime Axetil Other (See Comments)    Increased bloating, abd pain  . Clarithromycin Other (See Comments)    abd pain  . Etanercept Hives and Other (See Comments)    Injection  site irritation (ENBREL)  . Levofloxacin Hives and Other (See Comments)    Severe dizziness  . Sulfa Antibiotics Hives and Other (See Comments)    abd pain  . Methotrexate Derivatives Other (See Comments)    Injection ONLY (irritation at the site of injection)    Medications Prior to Admission  Medication Sig Dispense Refill  . Adalimumab (HUMIRA PEN) 40 MG/0.8ML PNKT Inject 40 mg into the skin every 14 (fourteen) days. Every other week on Wednesdays.    Marland Kitchen BIOTIN PO Take 1 tablet by mouth daily at  3 pm.     . BOTOX 100 units SOLR injection Inject 100 Units as directed as directed. Every 12 weeks    . citalopram (CELEXA) 20 MG tablet Take 10 mg by mouth at bedtime.  3  . estradiol (ESTRACE) 2 MG tablet Take 1 mg by mouth daily.   5  . fluticasone (FLOVENT HFA) 220 MCG/ACT inhaler Inhale 4 puffs into the lungs 2 (two) times daily. 4 Inhaler 0  . folic acid (FOLVITE) 1 MG tablet Take 1 mg by mouth daily.     Marland Kitchen ibuprofen (ADVIL,MOTRIN) 200 MG tablet Take 400-600 mg by mouth every 8 (eight) hours as needed (headaches/pain.).    Marland Kitchen ipratropium (ATROVENT) 0.03 % nasal spray Place 2 sprays into both nostrils daily as needed for congestion or allergies.  0  . Levothyroxine Sodium (TIROSINT) 88 MCG CAPS Take 1 capsule (88 mcg total) by mouth daily before breakfast. 90 capsule 4  . loratadine (CLARITIN) 10 MG tablet Take 10 mg by mouth daily.    . methotrexate (RHEUMATREX) 2.5 MG tablet Take 5 mg by mouth every Sunday. Caution:Chemotherapy. Protect from light.     . montelukast (SINGULAIR) 10 MG tablet Take 1 tablet (10 mg total) by mouth at bedtime. 30 tablet 5  . naproxen sodium (ALEVE) 220 MG tablet Take 220-440 mg by mouth 2 (two) times daily as needed (pain/headaches.).    Marland Kitchen propranolol ER (INDERAL LA) 60 MG 24 hr capsule TAKE 1 CAPSULE(60 MG) BY MOUTH DAILY (Patient taking differently: Take 60 mg by mouth daily. ) 90 capsule 1  . SUMAtriptan (IMITREX) 100 MG tablet SEE NOTES (Patient taking differently: Take 100 mg by mouth every 2 (two) hours as needed for migraine. ) 10 tablet 0  . Tetrahydrozoline-Zn Sulfate (ALLERGY RELIEF EYE DROPS OP) Place 1-2 drops into both eyes 3 (three) times daily as needed (FOR IRRITATED/ALLERGY EYES).    . Vitamin D, Ergocalciferol, (DRISDOL) 50000 units CAPS capsule Take 50,000 Units by mouth every Sunday.       No results found for this or any previous visit (from the past 48 hour(s)). No results found.  ROS  Blood pressure (!) 153/85, pulse 82, temperature  98 F (36.7 C), temperature source Oral, resp. rate 15, height 5\' 2"  (1.575 m), weight 58 kg, last menstrual period 03/31/2017, SpO2 99 %. Physical Exam  Constitutional: She appears well-developed and well-nourished.  HENT:  Mouth/Throat: Oropharynx is clear and moist.  Eyes: Conjunctivae are normal. No scleral icterus.  Neck: No thyromegaly present.  Cardiovascular: Normal rate, regular rhythm and normal heart sounds.  No murmur heard. Respiratory: Effort normal and breath sounds normal.  GI: Soft. She exhibits no distension and no mass. There is no abdominal tenderness.  Musculoskeletal:        General: No edema.  Lymphadenopathy:    She has no cervical adenopathy.  Neurological: She is alert.  Skin: Skin is warm and dry.  Assessment/Plan Esophageal dysphagia. History of EoE and distal esophageal stricture. EGD with ED.  Hildred Laser, MD 10/19/2018, 7:34 AM

## 2018-10-19 NOTE — Op Note (Signed)
[facility] Patient Name: Laurie Cherry Procedure Date: 10/19/2018 7:17 AM MRN: [id number] Date of Birth: [birth year] Attending MD: Hildred Laser , MD CSN: [id number] Age: 57 Admit Type: Outpatient Procedure:                Upper GI endoscopy Indications:              Stenosis of the esophagus, For therapy of                            esophageal stenosis Providers:                Hildred Laser, MD, Otis Peak B. Sharon Seller, RN, Randa Spike, Technician Referring MD:             Laray Anger, NP Medicines:                Lidocaine spray, Meperidine 50 mg IV, Midazolam 5                            mg IV Complications:            No immediate complications. Estimated Blood Loss:     Estimated blood loss was minimal. Procedure:                Pre-Anesthesia Assessment:                           - Prior to the procedure, a History and Physical                            was performed, and patient medications and                            allergies were reviewed. The patient's tolerance of                            previous anesthesia was also reviewed. The risks                            and benefits of the procedure and the sedation                            options and risks were discussed with the patient.                            All questions were answered, and informed consent                            was obtained. Prior Anticoagulants: The patient                            last took naproxen 1 day prior to the procedure.  ASA Grade Assessment: III - A patient with severe                            systemic disease. After reviewing the risks and                            benefits, the patient was deemed in satisfactory                            condition to undergo the procedure.                           After obtaining informed consent, the endoscope was                            passed under direct vision.  Throughout the                            procedure, the patient's blood pressure, pulse, and                            oxygen saturations were monitored continuously. The                            GIF-H190 ([id number]) scope was introduced through the                            mouth, and advanced to the second part of duodenum.                            The upper GI endoscopy was accomplished without                            difficulty. The patient tolerated the procedure                            well. Scope In: 7:47:22 AM Scope Out: 7:57:28 AM Total Procedure Duration: 0 hours 10 minutes 6 seconds  Findings:      The proximal esophagus and mid esophagus were normal.      Mild mucosal changes characterized by subtle circumferential folds were       found in the distal esophagus. Biopsies were taken with a cold forceps       for histology.      LA Grade B (one or more mucosal breaks greater than 5 mm, not extending       between the tops of two mucosal folds) esophagitis was found 35 to 36 cm       from the incisors.      One benign-appearing, intrinsic mild stenosis was found 36 cm from the       incisors. The stenosis was traversed. The dilation site was examined and       showed mild mucosal disruption, complete resolution of luminal narrowing       and no perforation.      A 2 cm hiatal hernia was present.  The entire examined stomach was normal.      The duodenal bulb and second portion of the duodenum were normal. Impression:               - Normal proximal esophagus and mid esophagus.                           - Subtle circumferentially folded mucosa in the                            esophagus. Biopsied.                           - LA Grade B reflux esophagitis.                           - Benign-appearing esophageal stenosis.                           - 2 cm hiatal hernia.                           - Normal stomach.                           - Normal duodenal bulb  and second portion of the                            duodenum. Moderate Sedation:      Moderate (conscious) sedation was administered by the endoscopy nurse       and supervised by the endoscopist. The following parameters were       monitored: oxygen saturation, heart rate, blood pressure, CO2       capnography and response to care. Total physician intraservice time was       16 minutes. Recommendation:           - Patient has a contact number available for                            emergencies. The signs and symptoms of potential                            delayed complications were discussed with the                            patient. Return to normal activities tomorrow.                            Written discharge instructions were provided to the                            patient.                           - Resume previous diet today.                           -  Continue present medications.                           - No aspirin, ibuprofen, naproxen, or other                            non-steroidal anti-inflammatory drugs for 3 days.                           - Use Tagamet (cimetidine) 400 mg PO BID.                           - Await pathology results.                           - Return to GI clinic in 3 months.. Procedure Code(s):        --- Professional ---                           [phone], Esophagogastroduodenoscopy, flexible,                            transoral; with biopsy, single or multiple                           G0500, Moderate sedation services provided by the                            same physician or other qualified health care                            professional performing a gastrointestinal                            endoscopic service that sedation supports,                            requiring the presence of an independent trained                            observer to assist in the monitoring of the                            patient's level of  consciousness and physiological                            status; initial 15 minutes of intra-service time;                            patient age 52 years or older (additional time may                            be reported with [phone], as appropriate) Diagnosis Code(s):        --- Professional ---  K22.8, Other specified diseases of esophagus                           K21.0, Gastro-esophageal reflux disease with                            esophagitis                           K22.2, Esophageal obstruction                           K44.9, Diaphragmatic hernia without obstruction or                            gangrene CPT copyright 2018 [employer]. All rights reserved. The codes documented in this report are preliminary and upon coder review may  be revised to meet current compliance requirements. Hildred Laser, MD Hildred Laser, MD 10/19/2018 8:12:47 AM This report has been signed electronically. Number of Addenda: 0

## 2018-10-19 NOTE — Discharge Instructions (Signed)
No aspirin or NSAIDs for 3 days. Resume other medications as before. Cimetidine 400 mg by mouth twice daily.  Take it before breakfast and at bedtime. Resume usual diet. No driving for 24 hours. Physician will call with biopsy results.   Upper Endoscopy, Adult, Care After This sheet gives you information about how to care for yourself after your procedure. Your health care provider may also give you more specific instructions. If you have problems or questions, contact your health care provider. What can I expect after the procedure? After the procedure, it is common to have:  A sore throat.  Mild stomach pain or discomfort.  Bloating.  Nausea. Follow these instructions at home:   Follow instructions from your health care provider about what to eat or drink after your procedure.  Return to your normal activities as told by your health care provider. Ask your health care provider what activities are safe for you.  Take over-the-counter and prescription medicines only as told by your health care provider.  Do not drive for 24 hours if you were given a sedative during your procedure.  Keep all follow-up visits as told by your health care provider. This is important. Contact a health care provider if you have:  A sore throat that lasts longer than one day.  Trouble swallowing. Get help right away if:  You vomit blood or your vomit looks like coffee grounds.  You have: ? A fever. ? Bloody, black, or tarry stools. ? A severe sore throat or you cannot swallow. ? Difficulty breathing. ? Severe pain in your chest or abdomen. Summary  After the procedure, it is common to have a sore throat, mild stomach discomfort, bloating, and nausea.  Do not drive for 24 hours if you were given a sedative during the procedure.  Follow instructions from your health care provider about what to eat or drink after your procedure.  Return to your normal activities as told by your health  care provider. This information is not intended to replace advice given to you by your health care provider. Make sure you discuss any questions you have with your health care provider. Document Released: 04/14/2012 Document Revised: 03/16/2018 Document Reviewed: 03/16/2018 Elsevier Interactive Patient Education  2019 [employer]  Cimetidine tablets What is this medicine? CIMETIDINE (sye MET i deen) is a type of antihistamine that blocks the release of stomach acid. It is used to treat stomach or intestinal ulcers. It can relieve ulcer pain and discomfort, and the heartburn from acid reflux. This medicine may be used for other purposes; ask your health care provider or pharmacist if you have questions. COMMON BRAND NAME(S): Acid Reducer, Major Acid Reducer, Tagamet, Tagamet HB What should I tell my health care provider before I take this medicine? They need to know if you have any of these conditions: -blood in your stools (black or tarry stools) or if you have blood in your vomit -kidney disease -liver disease -pain or trouble trying to swallow food -an unusual or allergic reaction to cimetidine, other medicines, foods, dyes, or preservatives -pregnant or trying to get pregnant -breast-feeding How should I use this medicine? Take this medicine by mouth. Follow the directions on the prescription label. Swallow the tablets with a drink of water. If you only take this medicine once a day take it at bedtime. Take your doses at regular intervals. Do not take your medicine more often than directed. Talk to your pediatrician regarding the use of this medicine in children. Special  care may be needed. Overdosage: If you think you have taken too much of this medicine contact a poison control center or emergency room at once. NOTE: This medicine is only for you. Do not share this medicine with others. What if I miss a dose? If you miss a dose, take it as soon as you can. If it is almost time for  your next dose, take only that dose. Do not take double or extra doses. What may interact with this medicine? Do not take cimetidine if you take the following drugs: -cisapride -dofetilide -pimozide This medicine may also interact with the following medications: -caffeine -carbamazepine -carmustine -delavirdine -female hormones, including contraceptive or birth control pills -itraconazole -ketoconazole -medicines for heart rhythm problems -phenytoin -theophylline -warfarin This list may not describe all possible interactions. Give your health care provider a list of all the medicines, herbs, non-prescription drugs, or dietary supplements you use. Also tell them if you smoke, drink alcohol, or use illegal drugs. Some items may interact with your medicine. What should I watch for while using this medicine? Tell your doctor or health care professional if your pain does not start to get better or gets worse. You may need to take this medicine for several days before your symptoms get better. Finish the full course of tablets prescribed by your doctor or health care professional even if you feel better. Do not take with aspirin, ibuprofen, or other antiinflammatory medicines unless directed to do so by your health care professional. These can make your condition worse. Do not smoke cigarettes or drink alcohol. These increase irritation in your stomach and can increase the time it will take for your ulcer to heal. If you get black, tarry stools or vomit up what looks like coffee grounds, call your doctor or health care professional right away. You may have a bleeding ulcer. This medicine may cause a decrease in vitamin B12. You should make sure that you get enough vitamin B12 while you are taking this medicine. Discuss the foods you eat and the vitamins you take with your health care professional. What side effects may I notice from receiving this medicine? Side effects that you should report to  your doctor or health care professional as soon as possible: -allergic reactions like skin rash, itching or hives, swelling of the face, lips, or tongue -agitation, nervousness, depression, hallucinations -breast swelling, tenderness -change in sex drive or performance -dark urine -redness, blistering, peeling or loosening of the skin, including inside the mouth -yellowing of the eyes or skin Side effects that usually do not require medical attention (report to your doctor or health care professional if they continue or are bothersome): -diarrhea -headache -nausea, vomiting This list may not describe all possible side effects. Call your doctor for medical advice about side effects. You may report side effects to FDA at 1-800-FDA-1088. Where should I keep my medicine? Keep out of the reach of children. Store at room temperature between 15 and 30 degrees C (59 and 86 degrees F). Protect from light. Keep container tightly closed. Throw away any unused medicine after the expiration date. NOTE: This sheet is a summary. It may not cover all possible information. If you have questions about this medicine, talk to your doctor, pharmacist, or health care provider.  2019 Elsevier/Gold Standard (2017-05-30 13:12:36)   Gastroesophageal Reflux Disease, Adult Gastroesophageal reflux (GER) happens when acid from the stomach flows up into the tube that connects the mouth and the stomach (esophagus). Normally, food travels down the  esophagus and stays in the stomach to be digested. However, when a person has GER, food and stomach acid sometimes move back up into the esophagus. If this becomes a more serious problem, the person may be diagnosed with a disease called gastroesophageal reflux disease (GERD). GERD occurs when the reflux:  Happens often.  Causes frequent or severe symptoms.  Causes problems such as damage to the esophagus. When stomach acid comes in contact with the esophagus, the acid may  cause soreness (inflammation) in the esophagus. Over time, GERD may create small holes (ulcers) in the lining of the esophagus. What are the causes? This condition is caused by a problem with the muscle between the esophagus and the stomach (lower esophageal sphincter, or LES). Normally, the LES muscle closes after food passes through the esophagus to the stomach. When the LES is weakened or abnormal, it does not close properly, and that allows food and stomach acid to go back up into the esophagus. The LES can be weakened by certain dietary substances, medicines, and medical conditions, including:  Tobacco use.  Pregnancy.  Having a hiatal hernia.  Alcohol use.  Certain foods and beverages, such as coffee, chocolate, onions, and peppermint. What increases the risk? You are more likely to develop this condition if you:  Have an increased body weight.  Have a connective tissue disorder.  Use NSAID medicines. What are the signs or symptoms? Symptoms of this condition include:  Heartburn.  Difficult or painful swallowing.  The feeling of having a lump in the throat.  Abitter taste in the mouth.  Bad breath.  Having a large amount of saliva.  Having an upset or bloated stomach.  Belching.  Chest pain. Different conditions can cause chest pain. Make sure you see your health care provider if you experience chest pain.  Shortness of breath or wheezing.  Ongoing (chronic) cough or a night-time cough.  Wearing away of tooth enamel.  Weight loss. How is this diagnosed? Your health care provider will take a medical history and perform a physical exam. To determine if you have mild or severe GERD, your health care provider may also monitor how you respond to treatment. You may also have tests, including:  A test to examine your stomach and esophagus with a small camera (endoscopy).  A test thatmeasures the acidity level in your esophagus.  A test thatmeasures how much  pressure is on your esophagus.  A barium swallow or modified barium swallow test to show the shape, size, and functioning of your esophagus. How is this treated? The goal of treatment is to help relieve your symptoms and to prevent complications. Treatment for this condition may vary depending on how severe your symptoms are. Your health care provider may recommend:  Changes to your diet.  Medicine.  Surgery. Follow these instructions at home: Eating and drinking   Follow a diet as recommended by your health care provider. This may involve avoiding foods and drinks such as: ? Coffee and tea (with or without caffeine). ? Drinks that containalcohol. ? Energy drinks and sports drinks. ? Carbonated drinks or sodas. ? Chocolate and cocoa. ? Peppermint and mint flavorings. ? Garlic and onions. ? Horseradish. ? Spicy and acidic foods, including peppers, chili powder, curry powder, vinegar, hot sauces, and barbecue sauce. ? Citrus fruit juices and citrus fruits, such as oranges, lemons, and limes. ? Tomato-based foods, such as red sauce, chili, salsa, and pizza with red sauce. ? Fried and fatty foods, such as donuts, french  fries, potato chips, and high-fat dressings. ? High-fat meats, such as hot dogs and fatty cuts of red and white meats, such as rib eye steak, sausage, ham, and bacon. ? High-fat dairy items, such as whole milk, butter, and cream cheese.  Eat small, frequent meals instead of large meals.  Avoid drinking large amounts of liquid with your meals.  Avoid eating meals during the 2-3 hours before bedtime.  Avoid lying down right after you eat.  Do not exercise right after you eat. Lifestyle   Do not use any products that contain nicotine or tobacco, such as cigarettes, e-cigarettes, and chewing tobacco. If you need help quitting, ask your health care provider.  Try to reduce your stress by using methods such as yoga or meditation. If you need help reducing stress,  ask your health care provider.  If you are overweight, reduce your weight to an amount that is healthy for you. Ask your health care provider for guidance about a safe weight loss goal. General instructions  Pay attention to any changes in your symptoms.  Take over-the-counter and prescription medicines only as told by your health care provider. Do not take aspirin, ibuprofen, or other NSAIDs unless your health care provider told you to do so.  Wear loose-fitting clothing. Do not wear anything tight around your waist that causes pressure on your abdomen.  Raise (elevate) the head of your bed about 6 inches (15 cm).  Avoid bending over if this makes your symptoms worse.  Keep all follow-up visits as told by your health care provider. This is important. Contact a health care provider if:  You have: ? New symptoms. ? Unexplained weight loss. ? Difficulty swallowing or it hurts to swallow. ? Wheezing or a persistent cough. ? A hoarse voice.  Your symptoms do not improve with treatment. Get help right away if you:  Have pain in your arms, neck, jaw, teeth, or back.  Feel sweaty, dizzy, or light-headed.  Have chest pain or shortness of breath.  Vomit and your vomit looks like blood or coffee grounds.  Faint.  Have stool that is bloody or black.  Cannot swallow, drink, or eat. Summary  Gastroesophageal reflux happens when acid from the stomach flows up into the esophagus. GERD is a disease in which the reflux happens often, causes frequent or severe symptoms, or causes problems such as damage to the esophagus.  Treatment for this condition may vary depending on how severe your symptoms are. Your health care provider may recommend diet and lifestyle changes, medicine, or surgery.  Contact a health care provider if you have new or worsening symptoms.  Take over-the-counter and prescription medicines only as told by your health care provider. Do not take aspirin, ibuprofen, or  other NSAIDs unless your health care provider told you to do so.  Keep all follow-up visits as told by your health care provider. This is important. This information is not intended to replace advice given to you by your health care provider. Make sure you discuss any questions you have with your health care provider. Document Released: 07/24/2005 Document Revised: 04/22/2018 Document Reviewed: 04/22/2018 Elsevier Interactive Patient Education  2019 [employer].

## 2018-10-26 ENCOUNTER — Encounter (HOSPITAL_COMMUNITY): Payer: Self-pay | Admitting: Internal Medicine

## 2018-11-06 ENCOUNTER — Encounter (INDEPENDENT_AMBULATORY_CARE_PROVIDER_SITE_OTHER): Payer: Self-pay

## 2018-11-26 ENCOUNTER — Other Ambulatory Visit (INDEPENDENT_AMBULATORY_CARE_PROVIDER_SITE_OTHER): Payer: Self-pay | Admitting: Internal Medicine

## 2018-12-07 IMAGING — RF DG ESOPHAGUS
12 series · 14 of 24 positions shown · non-contrast
Comparison: 03/19/2018

CLINICAL DATA: Dysphagia with primarily solids. Prior dilatation in
[REDACTED].

EXAM:
ESOPHOGRAM / BARIUM SWALLOW / BARIUM TABLET STUDY
TECHNIQUE: Combined double contrast and single contrast examination performed
using effervescent crystals, thick barium liquid, and thin barium
liquid. The patient was observed with fluoroscopy swallowing a 13 mm
barium sulphate tablet.
FLUOROSCOPY TIME:  Fluoroscopy Time:  3 minutes and 0 seconds
Radiation Exposure Index (if provided by the fluoroscopic device):
Not applicable.
Number of Acquired Spot Images: 0

[Series 1: cp_standard · 0.19mm/px · 1 of 41 frames shown (1 of 12)]
[frame 7/41]
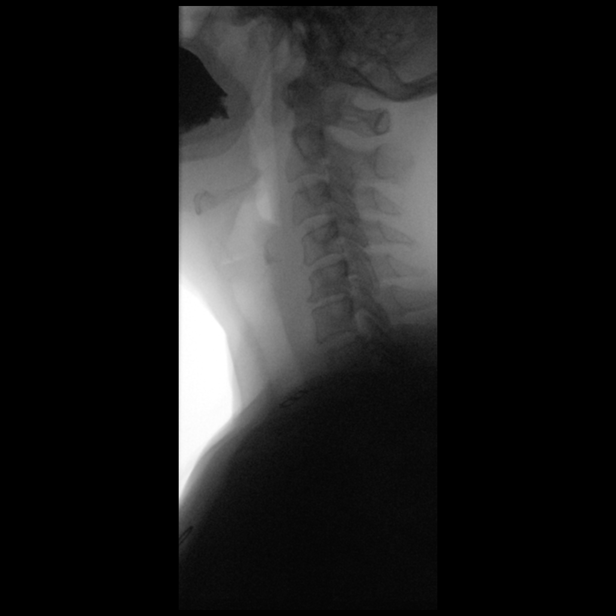

[Series 2: cp_standard · 0.19mm/px · 2 of 130 frames shown (2 of 12)]
[frame 12/130]
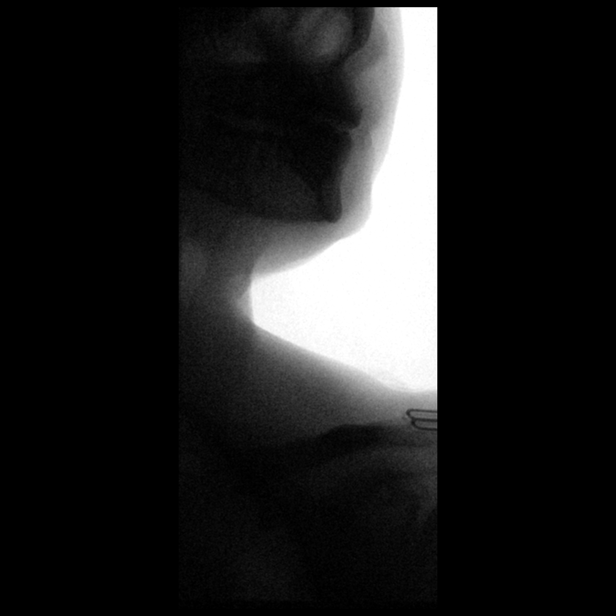
[frame 111/130]
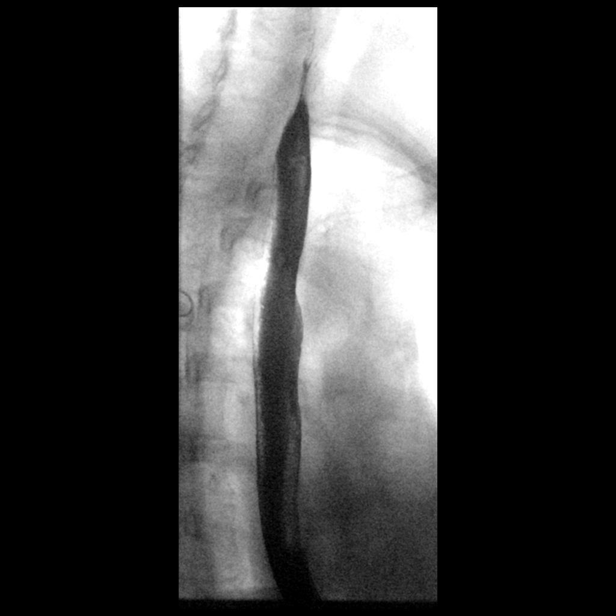

[Series 3: cp_standard · 0.19mm/px · 1 of 106 frames shown (3 of 12)]
[frame 91/106]
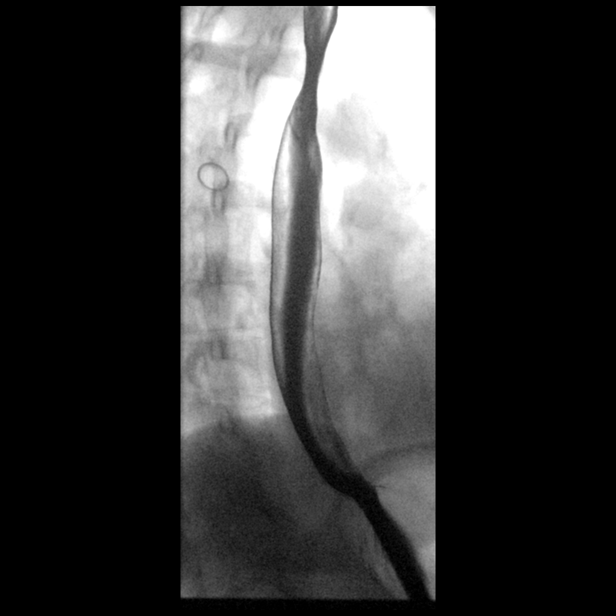

[Series 4: cp_standard · 0.19mm/px · 1 of 40 frames shown (4 of 12)]
[frame 7/40]
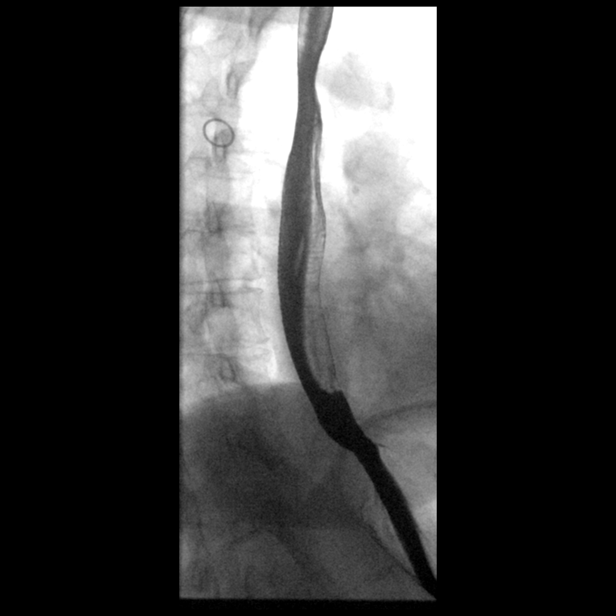

[Series 5: cp_standard · 0.20mm/px · 1 of 50 frames shown (5 of 12)]
[frame 8/50]
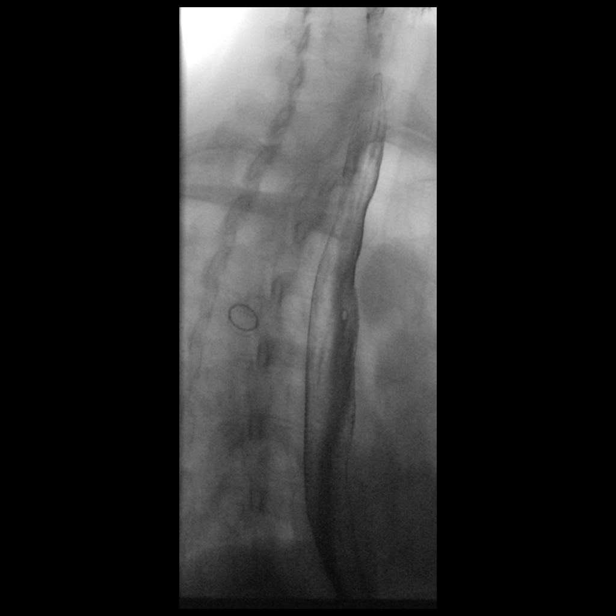

[Series 6: cp_standard · 0.20mm/px · 1 of 1 slices shown (6 of 12)]
[im 1/1]
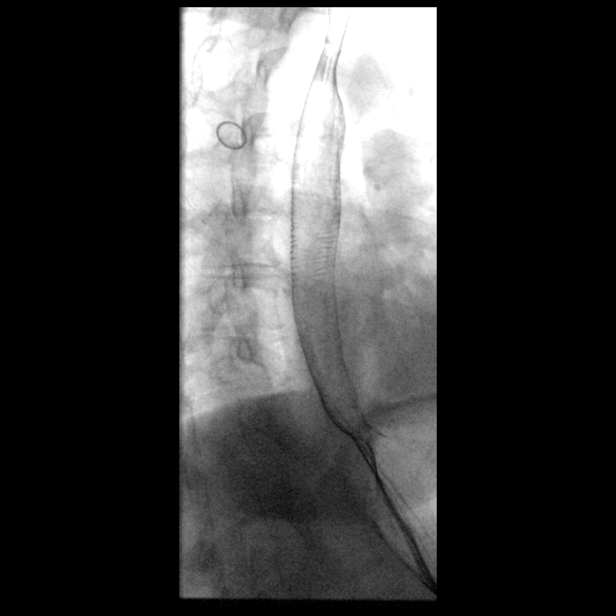

[Series 7: cp_standard · 0.30mm/px · 1 of 110 frames shown (7 of 12)]
[frame 17/110]
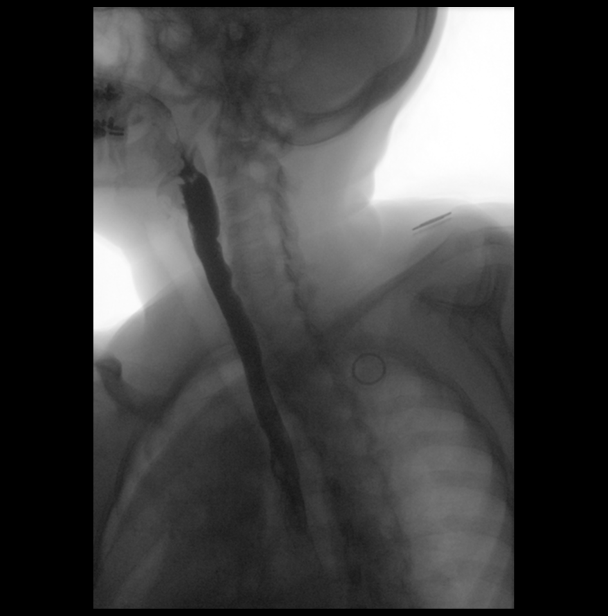

[Series 8: cp_standard · 0.30mm/px · 1 of 122 frames shown (8 of 12)]
[frame 19/122]
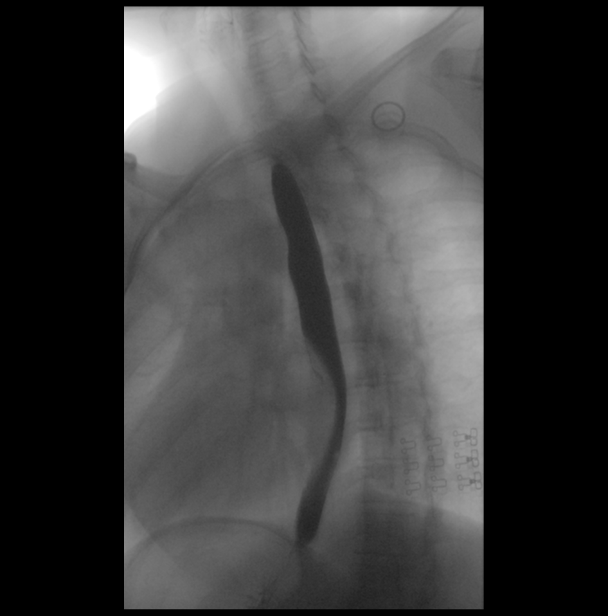

[Series 9: cp_standard · 0.30mm/px · 2 of 234 frames shown (9 of 12)]
[frame 1/234]
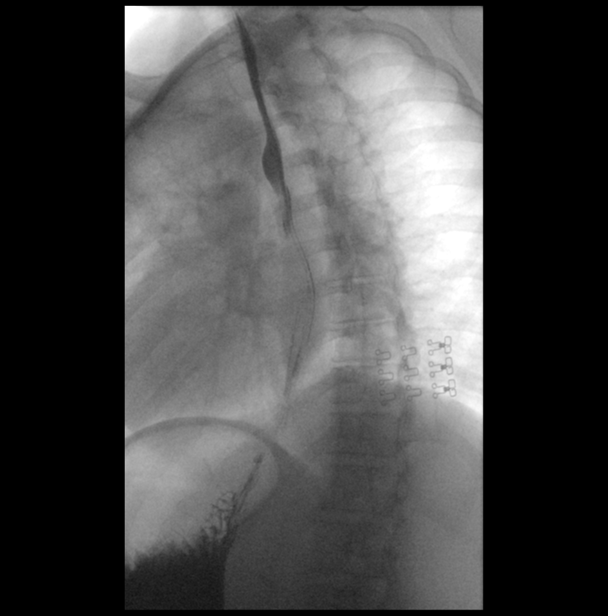
[frame 199/234]
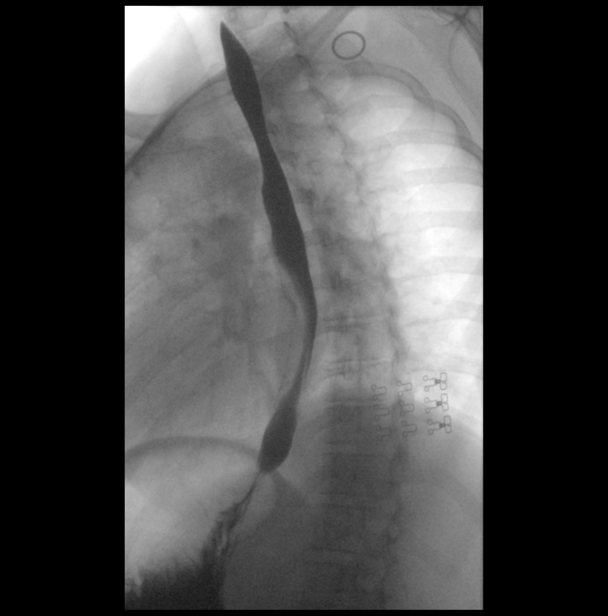

[Series 10: cp_standard · 0.30mm/px · 1 of 157 frames shown (10 of 12)]
[frame 79/157]
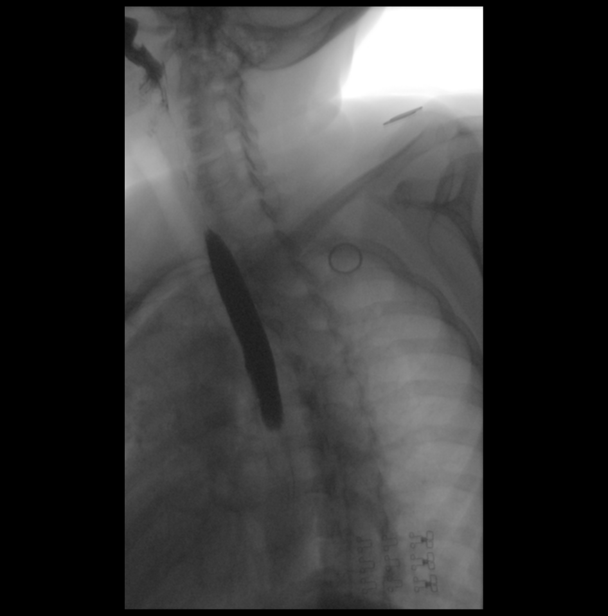

[Series 11: cp_standard · 0.20mm/px · 1 of 114 frames shown (11 of 12)]
[frame 18/114]
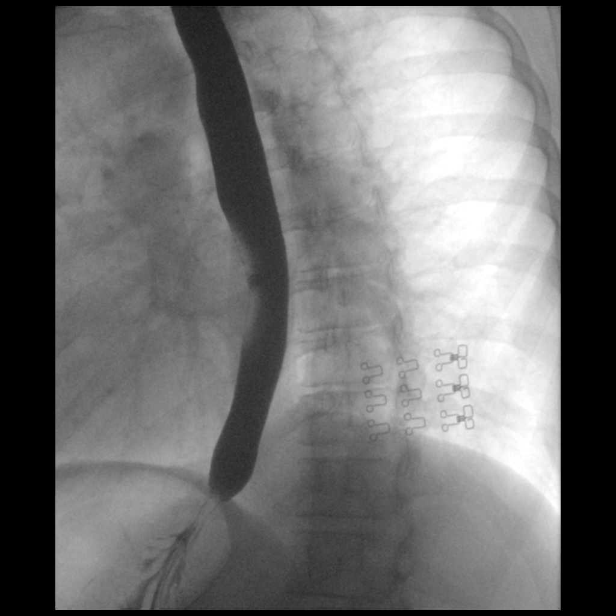

[Series 12: cp_standard · 0.20mm/px · 1 of 1 slices shown (12 of 12)]
[im 1/1]
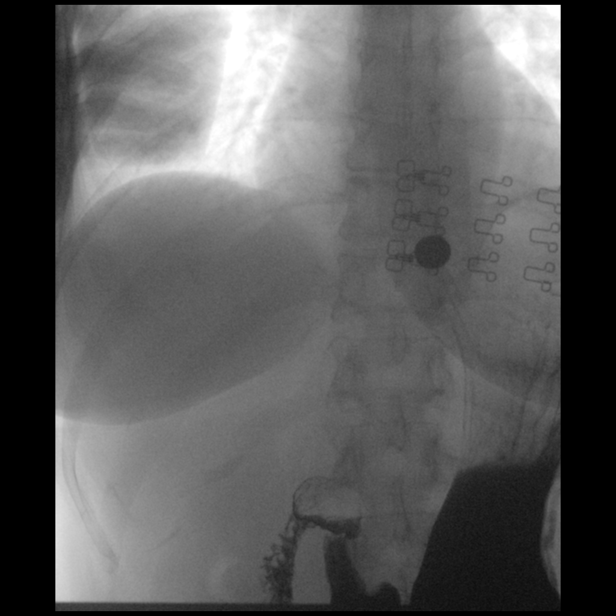

[14 of 24 positions shown; findings below may reference images not displayed]

FINDINGS: Hypopharyngeal portion of the exam is unremarkable.

Double contrast evaluation of the esophagus demonstrates a
suggestion of subtle "feline esophagus" including on image 50/5.

Evaluation of esophageal peristalsis demonstrates proximal escape
waves and contrast stasis in the lower esophagus.

full column evaluation of the esophagus demonstrates no persistent
narrowing or stricture

Delayed passage of a 13 mm barium tablet at the gastroesophageal
junction.
IMPRESSION: 1. No residual stricture identified.
2. Mild esophageal dysmotility, likely presbyesophagus.
3. Delayed passage of a 13 mm tablet at the gastroesophageal
junction. This most likely relates to dysmotility.
4. Suspicion of "feline esophagus". This has been described in
gastroesophageal reflux disease.

## 2019-01-16 ENCOUNTER — Other Ambulatory Visit: Payer: Self-pay | Admitting: Neurology

## 2019-01-19 ENCOUNTER — Ambulatory Visit (INDEPENDENT_AMBULATORY_CARE_PROVIDER_SITE_OTHER): Payer: BC Managed Care – PPO | Admitting: Internal Medicine

## 2019-01-25 NOTE — Progress Notes (Signed)
Virtual Visit via Video Note The purpose of this virtual visit is to provide medical care while limiting exposure to the novel coronavirus.    Consent was obtained for video visit:  Yes.   Answered questions that patient had about telehealth interaction:  Yes.   I discussed the limitations, risks, security and privacy concerns of performing an evaluation and management service by telemedicine. I also discussed with the patient that there may be a patient responsible charge related to this service. The patient expressed understanding and agreed to proceed.  Pt location: Home Physician Location: office Name of referring provider:  Thea Alken, FNP I connected with Laurie Cherry at patients initiation/request on 01/26/2019 at  2:00 PM EDT by video enabled telemedicine application and verified that I am speaking with the correct person using two identifiers. Pt MRN:  [id number] Pt DOB:  [birth year]   History of Present Illness:  Laurie Cherry is a 58 year old right-handed female with rheumatoid arthritis, hypothyroidism, reflux and mild depression who follows up for migraines.  UPDATE: Last Botox was 06/26/18.  Due to high deductible, she was unable to afford Botox in December.  Now, all Botox procedures are on-hold until the COVID pandemic is under control.  Over the past 3 months, headaches have steadily increased.  She reports increased photosensitivity in left eye.  Since 12/27/18, they have been daily, lasting all day (unless she takes sumatriptan) and moderate to severe intensity.  Naproxen has not been effective.  She has been taking 600mg  of ibuprofen or Excedrin Migraine. Current NSAIDS:  ibuprofen 600mg  Current analgesics:  Excedrin Migraine Current triptans:  Sumatriptan 50mg  (100mg  caused drowsiness) w/wo naproxen 500mg  Current ergotamine:  none Current anti-emetic:  none Current muscle relaxants:  none Current anti-anxiolytic:  none Current sleep aide:  none Current  Antihypertensive medications:  Propranolol ER 60mg  Current Antidepressant medications:  Celexa Current Anticonvulsant medications:  none Current anti-CGRP:  none Current Vitamins/Herbal/Supplements:  D Current Antihistamines/Decongestants:  none Other therapy:  Botox Hormone/birth control:  estradiol  Caffeine:  1 cup of coffee daily Alcohol:  no Smoker:  no Diet:  1/2 can of soda daily.  Drinks water Exercise:  no Depression:  stable; Anxiety:  no Other pain:  no Sleep hygiene:  varies  HISTORY: She has a history of "hormonal" migraines most of her life, described as pounding right temporal pain and often associated with her menstrual cycle.They have pretty much stabilized but she will still get it every now and then.  She was diagnosed with rheumatoid arthritis in 2014.She had been on methotrexate for about 2 years (stopped a month ago).In September 2015, she began having new headaches.It coincided with change in her thyroid medications.They are left sided, 7/10 non-throbbing pain. They are associated with photophobia, phonophobia, and osmophobia but not nausea.  Initially, they typically lasted 3 to 4 days and occur 1 to 2 times a week (however she also has a dull daily headache) Stress is a trigger.Change in thyroid medication may have been a trigger. These headaches do not correlate with her menstrual cycle. Sumatriptan with naproxen helps relieve it.  Past NSAIDS: no Past analgesics: Excedrin, Tylenol (ineffective) Past abortive triptans: no Past muscle relaxants: no Past anti-emetic: no Past antihypertensive medications: atenolol (25mg  ineffective and higher doses caused dizziness) Past antidepressant medications: venlafaxine XR 150mg  Past anticonvulsant medications: topiramate 25mg  (caused drowsiness, but willing to retry it if needed) Past anti-CGRP:  no Past vitamins/Herbal/Supplements: no Past antihistamines/decongestants: no Other past  therapies: no  Family history of headache:Mom, sister Mother had glioblastoma  Past Medical History: Past Medical History:  Diagnosis Date  . Depression   . Esophagitis   . Family history of adverse reaction to anesthesia    Sister  . Fibroids   . GERD (gastroesophageal reflux disease)   . Hypothyroidism   . Rheumatoid arthritis ([place])   . Rheumatoid arthritis ([place]) 01/20/2017  . Worsening headaches     Medications: Outpatient Encounter Medications as of 01/26/2019  Medication Sig Note  . Adalimumab (HUMIRA PEN) 40 MG/0.8ML PNKT Inject 40 mg into the skin every 14 (fourteen) days. Every other week on Wednesdays.   Marland Kitchen BIOTIN PO Take 1 tablet by mouth daily at 3 pm.    . BOTOX 100 units SOLR injection Inject 100 Units as directed as directed. Every 12 weeks  On hold for now 10/13/2018: Last received:06/2018  . cimetidine (TAGAMET) 400 MG tablet Take 1 tablet (400 mg total) by mouth 2 (two) times daily.   . citalopram (CELEXA) 20 MG tablet Take 10 mg by mouth at bedtime.   Marland Kitchen estradiol (ESTRACE) 2 MG tablet Take 0.5 mg by mouth daily.    . fluticasone (FLOVENT HFA) 220 MCG/ACT inhaler Inhale 4 puffs into the lungs 2 (two) times daily.   . folic acid (FOLVITE) 1 MG tablet Take 1 mg by mouth daily.    Marland Kitchen ibuprofen (ADVIL,MOTRIN) 200 MG tablet Take 400-600 mg by mouth every 8 (eight) hours as needed (headaches/pain.).   Marland Kitchen ipratropium (ATROVENT) 0.03 % nasal spray Place 2 sprays into both nostrils daily as needed for congestion or allergies.   . Levothyroxine Sodium (TIROSINT) 88 MCG CAPS Take 1 capsule (88 mcg total) by mouth daily before breakfast.   . loratadine (CLARITIN) 10 MG tablet Take 10 mg by mouth daily.   . methotrexate (RHEUMATREX) 2.5 MG tablet Take 5 mg by mouth every Sunday. Caution:Chemotherapy. Protect from light.    . montelukast (SINGULAIR) 10 MG tablet TAKE 1 TABLET(10 MG) BY MOUTH AT BEDTIME   . naproxen sodium (ALEVE) 220 MG tablet Take 220-440 mg by mouth 2 (two)  times daily as needed (pain/headaches.).   Marland Kitchen propranolol ER (INDERAL LA) 60 MG 24 hr capsule TAKE 1 CAPSULE(60 MG) BY MOUTH DAILY (Patient taking differently: Take 60 mg by mouth daily. )   . SUMAtriptan (IMITREX) 100 MG tablet SEE NOTES   . Tetrahydrozoline-Zn Sulfate (ALLERGY RELIEF EYE DROPS OP) Place 1-2 drops into both eyes 3 (three) times daily as needed (FOR IRRITATED/ALLERGY EYES).   . Vitamin D, Ergocalciferol, (DRISDOL) 50000 units CAPS capsule Take 50,000 Units by mouth every Sunday.     No facility-administered encounter medications on file as of 01/26/2019.    Allergies: Allergies  Allergen Reactions  . Arava [Leflunomide] Hives  . Azithromycin Other (See Comments)    Severe bloating, abd pain  . Cefuroxime Axetil Other (See Comments)    Increased bloating, abd pain  . Clarithromycin Other (See Comments)    abd pain  . Etanercept Hives and Other (See Comments)    Injection site irritation (ENBREL)  . Levofloxacin Hives and Other (See Comments)    Severe dizziness  . Sulfa Antibiotics Hives and Other (See Comments)    abd pain  . Methotrexate Derivatives Other (See Comments)    Injection ONLY (irritation at the site of injection)    Family History: Family History  Problem Relation Age of Onset  . Thyroid disease Mother   . Heart attack Mother   .  Stroke Mother   . Diabetes Mother   . Brain cancer Mother   . Colon cancer Father   . Heart attack Sister   . Hypertension Sister   . Hypertension Brother    Social History: Social History   Socioeconomic History  . Marital status: Married    Spouse name: Not on file  . Number of children: Not on file  . Years of education: Not on file  . Highest education level: Not on file  Occupational History  . Not on file  Social Needs  . Financial resource strain: Not on file  . Food insecurity:    Worry: Not on file    Inability: Not on file  . Transportation needs:    Medical: Not on file    Non-medical: Not on  file  Tobacco Use  . Smoking status: Never Smoker  . Smokeless tobacco: Never Used  Substance and Sexual Activity  . Alcohol use: No    Alcohol/week: 0.0 standard drinks  . Drug use: No  . Sexual activity: Not on file  Lifestyle  . Physical activity:    Days per week: Not on file    Minutes per session: Not on file  . Stress: Not on file  Relationships  . Social connections:    Talks on phone: Not on file    Gets together: Not on file    Attends religious service: Not on file    Active member of club or organization: Not on file    Attends meetings of clubs or organizations: Not on file    Relationship status: Not on file  . Intimate partner violence:    Fear of current or ex partner: Not on file    Emotionally abused: Not on file    Physically abused: Not on file    Forced sexual activity: Not on file  Other Topics Concern  . Not on file  Social History Narrative  . Not on file   Review Of Systems: Review of Systems  Constitutional: Negative for chills, diaphoresis, fever and malaise/fatigue.  HENT: Negative for congestion, ear discharge, ear pain, hearing loss, nosebleeds, sinus pain, sore throat and tinnitus.   Eyes: Negative for blurred vision, double vision, discharge and redness.  Respiratory: Negative for cough, shortness of breath and wheezing.   Cardiovascular: Negative for chest pain, palpitations and orthopnea.  Gastrointestinal: Negative for abdominal pain, constipation, diarrhea, nausea and vomiting.  Genitourinary: Negative for dysuria.  Musculoskeletal: Positive for joint pain.  Skin: Negative for itching and rash.  Neurological: Negative for dizziness, tremors, sensory change, speech change, seizures and weakness.  Endo/Heme/Allergies: Negative for environmental allergies. Does not bruise/bleed easily.  Psychiatric/Behavioral: Negative for depression.   Observations/Objective:   Height 5\' 2"  (1.575 m), weight 130 lb (59 kg), last menstrual period  03/31/2017. No acute distress.  Well-groomed; alert and oriented to person, place, and time. Attention span and concentration intact, recent and remote memory intact, fund of knowledge intact.  Speech fluent and not dysarthric, language intact.  Pupils equal and round.  Moves eyes in all directions.  Facial sensation intact.  Face symmetric.  Tongue midline.  No pronator drift.  Finger-thumb tapping equal and normal.  Moves all extremities against gravity.  Finger to nose testing intact.  Gait normal, Romberg negative.  Assessment and Plan:   Migraine without aura, without status migrainosus, not intractable.  1.  For preventative management, we will try Aimovig 70mg  monthly.  I provided her the website address to activate a  copay card.  If effective, she may want to remain on Aimovig instead of Botox.   2.  For abortive therapy, sumatriptan 50mg . 3.  Limit use of pain relievers to no more than 2 days out of week to prevent risk of rebound or medication-overuse headache. 4.  Keep headache diary 5.  Exercise, hydration, caffeine cessation, sleep hygiene, monitor for and avoid triggers 6.  Consider:  magnesium citrate 400mg  daily, riboflavin 400mg  daily, and coenzyme Q10 100mg  three times daily 7.  Follow up in 4 months.   Follow Up Instructions:    -I discussed the assessment and treatment plan with the patient. The patient was provided an opportunity to ask questions and all were answered. The patient agreed with the plan and demonstrated an understanding of the instructions.   The patient was advised to call back or seek an in-person evaluation if the symptoms worsen or if the condition fails to improve as anticipated.  Dudley Major, DO

## 2019-01-26 ENCOUNTER — Encounter: Payer: Self-pay | Admitting: Neurology

## 2019-01-26 ENCOUNTER — Other Ambulatory Visit: Payer: Self-pay

## 2019-01-26 ENCOUNTER — Telehealth (INDEPENDENT_AMBULATORY_CARE_PROVIDER_SITE_OTHER): Payer: BC Managed Care – PPO | Admitting: Neurology

## 2019-01-26 VITALS — Ht 62.0 in | Wt 130.0 lb

## 2019-01-26 DIAGNOSIS — G43009 Migraine without aura, not intractable, without status migrainosus: Secondary | ICD-10-CM

## 2019-01-26 MED ORDER — ERENUMAB-AOOE 70 MG/ML ~~LOC~~ SOAJ: 70.0000 mg | SUBCUTANEOUS | 3 refills | Status: DC

## 2019-01-28 ENCOUNTER — Other Ambulatory Visit (INDEPENDENT_AMBULATORY_CARE_PROVIDER_SITE_OTHER): Payer: Self-pay | Admitting: Internal Medicine

## 2019-02-02 NOTE — Telephone Encounter (Signed)
Patient will need to have a Barium Study after this treatment.

## 2019-02-08 ENCOUNTER — Ambulatory Visit: Payer: BC Managed Care – PPO | Admitting: Neurology

## 2019-02-10 ENCOUNTER — Encounter (INDEPENDENT_AMBULATORY_CARE_PROVIDER_SITE_OTHER): Payer: Self-pay

## 2019-02-18 ENCOUNTER — Other Ambulatory Visit: Payer: Self-pay | Admitting: Neurology

## 2019-02-18 DIAGNOSIS — G43009 Migraine without aura, not intractable, without status migrainosus: Secondary | ICD-10-CM

## 2019-02-26 ENCOUNTER — Other Ambulatory Visit: Payer: Self-pay

## 2019-02-26 DIAGNOSIS — G43709 Chronic migraine without aura, not intractable, without status migrainosus: Secondary | ICD-10-CM

## 2019-02-26 MED ORDER — ERENUMAB-AOOE 140 MG/ML ~~LOC~~ SOAJ: 140.0000 mg | SUBCUTANEOUS | 11 refills | Status: DC

## 2019-03-15 ENCOUNTER — Ambulatory Visit (INDEPENDENT_AMBULATORY_CARE_PROVIDER_SITE_OTHER): Payer: BC Managed Care – PPO | Admitting: Internal Medicine

## 2019-03-16 ENCOUNTER — Ambulatory Visit (INDEPENDENT_AMBULATORY_CARE_PROVIDER_SITE_OTHER): Payer: BC Managed Care – PPO | Admitting: Internal Medicine

## 2019-03-19 ENCOUNTER — Other Ambulatory Visit: Payer: Self-pay

## 2019-03-19 NOTE — Telephone Encounter (Signed)
Called [facility] [phone] to have Botox delivered, spoke with Levada Dy.  Botox to be delivered on 03/24/19 Wednesday.

## 2019-03-23 ENCOUNTER — Telehealth: Payer: Self-pay

## 2019-03-23 NOTE — Telephone Encounter (Signed)
[facility]ed [facility] 5/22 and spoke with Levada Dy, she advised Botox would be delivered on 5/27  Rcvd message from Pt to confirm appt 5/29 and that Botox would be delivered since she was having trouble previously with the Botox copay card (pt's copay very expensive)   [facility], spoke with Alyse. She advised me the order did not go through. She will contact Pt.

## 2019-03-24 ENCOUNTER — Other Ambulatory Visit: Payer: Self-pay

## 2019-03-24 ENCOUNTER — Ambulatory Visit (INDEPENDENT_AMBULATORY_CARE_PROVIDER_SITE_OTHER): Payer: BC Managed Care – PPO | Admitting: Internal Medicine

## 2019-03-24 ENCOUNTER — Encounter (INDEPENDENT_AMBULATORY_CARE_PROVIDER_SITE_OTHER): Payer: Self-pay | Admitting: Internal Medicine

## 2019-03-24 VITALS — BP 133/80 | HR 67 | Temp 98.2°F | Ht 62.0 in | Wt 136.5 lb

## 2019-03-24 DIAGNOSIS — R1319 Other dysphagia: Secondary | ICD-10-CM

## 2019-03-24 DIAGNOSIS — R1310 Dysphagia, unspecified: Secondary | ICD-10-CM

## 2019-03-24 NOTE — Patient Instructions (Addendum)
OV in 1 year.  

## 2019-03-24 NOTE — Progress Notes (Signed)
Subjective:    Patient ID: Laurie Cherry, female    DOB: [birth year], 58 y.o.   MRN: [id number]  HPI Here today for f/u. Underwent and EGD in December of 2019. Impression:               - Normal proximal esophagus and mid esophagus.                           - Subtle circumferentially folded mucosa in the                            esophagus. Biopsied.                           - LA Grade B reflux esophagitis.                           - Benign-appearing esophageal stenosis.                           - 2 cm hiatal hernia.                           - Normal stomach.                           - Normal duodenal bulb and second portion of the                            duodenum. Underwent an EGD/ED for dysphagia.  She is doing better. Sometimes she says sometimes foods are slow to go down. She is chewing well and taking smaller bites. Her appetite is good. No weight loss. BMs are normal.  She takes Tagamet for her GERD.   Review of Systems     Past Medical History:  Diagnosis Date  . Depression   . Esophagitis   . Family history of adverse reaction to anesthesia    Sister  . Fibroids   . GERD (gastroesophageal reflux disease)   . Hypothyroidism   . Rheumatoid arthritis ([place])   . Rheumatoid arthritis ([place]) 01/20/2017  . Worsening headaches     Past Surgical History:  Procedure Laterality Date  . ABDOMINAL HYSTERECTOMY N/A 04/03/2017   Procedure: TOTAL HYSTERECTOMY ABDOMINAL;  Surgeon: Louretta Shorten, MD;  Location: [facility];  Service: Gynecology;  Laterality: N/A;  . BIOPSY  04/10/2018   Procedure: BIOPSY;  Surgeon: Rogene Houston, MD;  Location: [facility];  Service: Endoscopy;;  esophegus   . BIOPSY  10/19/2018   Procedure: BIOPSY;  Surgeon: Rogene Houston, MD;  Location: [facility];  Service: Endoscopy;;  esophageal   . COLONOSCOPY    . COLONOSCOPY    . COLONOSCOPY N/A 04/10/2018   Procedure: COLONOSCOPY;  Surgeon: Rogene Houston, MD;  Location: [facility];   Service: Endoscopy;  Laterality: N/A;  . ESOPHAGEAL DILATION N/A 04/10/2018   Procedure: ESOPHAGEAL DILATION;  Surgeon: Rogene Houston, MD;  Location: [facility];  Service: Endoscopy;  Laterality: N/A;  balloon dilation  . ESOPHAGEAL DILATION N/A 10/19/2018   Procedure: ESOPHAGEAL DILATION;  Surgeon: Rogene Houston, MD;  [facility]: [facility];  Service: Endoscopy;  Laterality: N/A;  . ESOPHAGOGASTRODUODENOSCOPY    . ESOPHAGOGASTRODUODENOSCOPY N/A 04/10/2018   Procedure: ESOPHAGOGASTRODUODENOSCOPY (EGD);  Surgeon: Rogene Houston, MD;  [facility]: [facility];  Service: Endoscopy;  Laterality: N/A;  1:25  . ESOPHAGOGASTRODUODENOSCOPY N/A 10/19/2018   Procedure: ESOPHAGOGASTRODUODENOSCOPY (EGD);  Surgeon: Rogene Houston, MD;  Location: [facility];  Service: Endoscopy;  Laterality: N/A;  7:30  . EYE SURGERY Right   . SALPINGOOPHORECTOMY Bilateral 04/03/2017   Procedure: SALPINGO OOPHORECTOMY;  Surgeon: Louretta Shorten, MD;  Location: [facility];  Service: Gynecology;  Laterality: Bilateral;    Allergies  Allergen Reactions  . Arava [Leflunomide] Hives  . Azithromycin Other (See Comments)    Severe bloating, abd pain  . Cefuroxime Axetil Other (See Comments)    Increased bloating, abd pain  . Clarithromycin Other (See Comments)    abd pain  . Etanercept Hives and Other (See Comments)    Injection site irritation (ENBREL)  . Levofloxacin Hives and Other (See Comments)    Severe dizziness  . Sulfa Antibiotics Hives and Other (See Comments)    abd pain  . Methotrexate Derivatives Other (See Comments)    Injection ONLY (irritation at the site of injection)    Current Outpatient Medications on File Prior to Visit  Medication Sig Dispense Refill  . Adalimumab (HUMIRA PEN) 40 MG/0.8ML PNKT Inject 40 mg into the skin every 14 (fourteen) days. Every other week on Wednesdays.    Marland Kitchen BIOTIN PO Take 1 tablet by mouth daily at 3 pm.     . BOTOX 100 units SOLR injection Inject 100 Units as  directed as directed. Every 12 weeks  On hold for now    . cimetidine (TAGAMET) 400 MG tablet Take 1 tablet (400 mg total) by mouth 2 (two) times daily. 60 tablet 5  . citalopram (CELEXA) 20 MG tablet Take 10 mg by mouth at bedtime.  3  . Erenumab-aooe (AIMOVIG) 140 MG/ML SOAJ Inject 140 mg into the skin every 30 (thirty) days. 1 pen 11  . Erenumab-aooe (AIMOVIG) 70 MG/ML SOAJ Inject 70 mg into the skin every 30 (thirty) days. 1 pen 3  . estradiol (ESTRACE) 2 MG tablet Take 0.5 mg by mouth daily.   5  . folic acid (FOLVITE) 1 MG tablet Take 1 mg by mouth daily.     Marland Kitchen ibuprofen (ADVIL,MOTRIN) 200 MG tablet Take 400-600 mg by mouth every 8 (eight) hours as needed (headaches/pain.).    Marland Kitchen Levothyroxine Sodium (TIROSINT) 88 MCG CAPS Take 1 capsule (88 mcg total) by mouth daily before breakfast. 90 capsule 4  . loratadine (CLARITIN) 10 MG tablet Take 10 mg by mouth daily.    . methotrexate (RHEUMATREX) 2.5 MG tablet Take 5 mg by mouth every Sunday. Caution:Chemotherapy. Protect from light.     . montelukast (SINGULAIR) 10 MG tablet TAKE 1 TABLET(10 MG) BY MOUTH AT BEDTIME 90 tablet 3  . naproxen sodium (ALEVE) 220 MG tablet Take 220-440 mg by mouth 2 (two) times daily as needed (pain/headaches.).    Marland Kitchen propranolol ER (INDERAL LA) 60 MG 24 hr capsule TAKE 1 CAPSULE(60 MG) BY MOUTH DAILY 90 capsule 1  . SUMAtriptan (IMITREX) 100 MG tablet SEE NOTES 10 tablet 1  . Tetrahydrozoline-Zn Sulfate (ALLERGY RELIEF EYE DROPS OP) Place 1-2 drops into both eyes 3 (three) times daily as needed (FOR IRRITATED/ALLERGY EYES).    . Vitamin D, Ergocalciferol, (DRISDOL) 50000 units CAPS capsule Take 50,000 Units by mouth every Sunday.      No  current facility-administered medications on file prior to visit.      Objective:   Physical Exam Blood pressure 133/80, pulse 67, temperature 98.2 F (36.8 C), height 5\' 2"  (1.575 m), weight 136 lb 8 oz (61.9 kg), last menstrual period 03/31/2017. Alert and oriented. Skin warm  and dry. Oral mucosa is moist.   . Sclera anicteric, conjunctivae is pink. Thyroid not enlarged. No cervical lymphadenopathy. Lungs clear. Heart regular rate and rhythm.  Abdomen is soft. Bowel sounds are positive. No hepatomegaly. No abdominal masses felt. No tenderness.  No edema to lower extremities.           Assessment & Plan:  Dysphagia. She is doing well. No dysphagia.

## 2019-03-25 ENCOUNTER — Telehealth: Payer: Self-pay

## 2019-03-25 NOTE — Telephone Encounter (Signed)
Called Pt to check status on Botox delivery. She said she paid the $1000 co-pay and Botox is to be delivered today.  [facility] [phone], spoke with Shona Needles. to confirm. He said delivery is for tomorrow 5/29, I advised him Pt's appt is tomorrow @ 2:10p.  Ronalee Belts checked with [employer] and confirmed can have Botox delivered early next day air for a morning delivery 10:30 am. If not delivered by 11am, call for tracking.  Shipping/order# [id number] Can call back to get tracking # once order has processed.

## 2019-03-26 ENCOUNTER — Ambulatory Visit (INDEPENDENT_AMBULATORY_CARE_PROVIDER_SITE_OTHER): Payer: BC Managed Care – PPO | Admitting: Neurology

## 2019-03-26 ENCOUNTER — Other Ambulatory Visit: Payer: Self-pay

## 2019-03-26 DIAGNOSIS — G43709 Chronic migraine without aura, not intractable, without status migrainosus: Secondary | ICD-10-CM

## 2019-03-26 MED ORDER — ONABOTULINUMTOXINA 100 UNITS IJ SOLR
200.0000 [IU] | Freq: Once | INTRAMUSCULAR | Status: AC
  Administered 2019-03-26: 16:00:00 200 [IU] via INTRAMUSCULAR

## 2019-03-26 NOTE — Progress Notes (Signed)
[facility]   Procedure Note Botox  Attending: Dr. Deni Berti  Preoperative Diagnosis(es): Chronic migraine  Consent obtained from: The patient Benefits discussed included, but were not limited to decreased muscle tightness, increased joint range of motion, and decreased pain.  Risk discussed included, but were not limited pain and discomfort, bleeding, bruising, excessive weakness, venous thrombosis, muscle atrophy and dysphagia.  Anticipated outcomes of the procedure as well as he risks and benefits of the alternatives to the procedure, and the roles and tasks of the personnel to be involved, were discussed with the patient, and the patient consents to the procedure and agrees to proceed. A copy of the patient medication guide was given to the patient which explains the blackbox warning.  Patients identity and treatment sites confirmed Yes.  .  Details of Procedure: Skin was cleaned with alcohol. Prior to injection, the needle plunger was aspirated to make sure the needle was not within a blood vessel.  There was no blood retrieved on aspiration.    Following is a summary of the muscles injected  And the amount of Botulinum toxin used:  Dilution 200 units of Botox was reconstituted with 4 ml of preservative free normal saline. Time of reconstitution: At the time of the office visit (<30 minutes prior to injection)   Injections  155 total units of Botox was injected with a 30 gauge needle.  Injection Sites: L occipitalis: 15 units- 3 sites  R occiptalis: 15 units- 3 sites  L upper trapezius: 15 units- 3 sites R upper trapezius: 15 units- 3 sits          L paraspinal: 10 units- 2 sites R paraspinal: 10 units- 2 sites  Face L frontalis(2 injection sites):10 units   R frontalis(2 injection sites):10 units         L corrugator: 5 units   R corrugator: 5 units           Procerus: 5 units   L temporalis: 20 units R temporalis: 20 units   Agent:  200 units of botulinum Type  A (Onobotulinum Toxin type A) was reconstituted with 4 ml of preservative free normal saline.  Time of reconstitution: At the time of the office visit (<30 minutes prior to injection)     Total injected (Units):  155  Total wasted (Units):  None wasted  Patient tolerated procedure well without complications.   Reinjection is anticipated in 3 months.   

## 2019-03-27 ENCOUNTER — Other Ambulatory Visit: Payer: Self-pay | Admitting: Neurology

## 2019-03-29 NOTE — Telephone Encounter (Signed)
Yes

## 2019-06-21 ENCOUNTER — Telehealth: Payer: Self-pay | Admitting: *Deleted

## 2019-06-21 NOTE — Telephone Encounter (Signed)
Sent message via my chart same as previous phone message

## 2019-06-21 NOTE — Telephone Encounter (Signed)
LMOM asking her to call [phone] option #2 to initiate delivery of Botox via specialty pharmacy. Left my direct line to call if she has questions.

## 2019-06-22 NOTE — Telephone Encounter (Signed)
CAlled to verufy she ordered the Botox for shipment. She said she did it online (first time online) and it is to be delivered Wednesday 06/23/2019. I told her we will only call if there is a problem.

## 2019-06-25 ENCOUNTER — Ambulatory Visit (INDEPENDENT_AMBULATORY_CARE_PROVIDER_SITE_OTHER): Payer: BC Managed Care – PPO | Admitting: Neurology

## 2019-06-25 ENCOUNTER — Other Ambulatory Visit: Payer: Self-pay

## 2019-06-25 DIAGNOSIS — G43709 Chronic migraine without aura, not intractable, without status migrainosus: Secondary | ICD-10-CM | POA: Diagnosis not present

## 2019-06-25 MED ORDER — ONABOTULINUMTOXINA 100 UNITS IJ SOLR
155.0000 [IU] | Freq: Once | INTRAMUSCULAR | Status: AC
  Administered 2019-06-25: 155 [IU] via INTRAMUSCULAR

## 2019-06-25 NOTE — Progress Notes (Signed)
Pt supplied medication 45 units wasted

## 2019-06-25 NOTE — Progress Notes (Signed)
[facility]  ° °Procedure Note Botox ° °Attending: Dr. Calvyn Kurtzman ° °Preoperative Diagnosis(es): Chronic migraine ° °Consent obtained from: The patient °Benefits discussed included, but were not limited to decreased muscle tightness, increased joint range of motion, and decreased pain.  Risk discussed included, but were not limited pain and discomfort, bleeding, bruising, excessive weakness, venous thrombosis, muscle atrophy and dysphagia.  Anticipated outcomes of the procedure as well as he risks and benefits of the alternatives to the procedure, and the roles and tasks of the personnel to be involved, were discussed with the patient, and the patient consents to the procedure and agrees to proceed. A copy of the patient medication guide was given to the patient which explains the blackbox warning. ° °Patients identity and treatment sites confirmed Yes.  . ° °Details of Procedure: °Skin was cleaned with alcohol. Prior to injection, the needle plunger was aspirated to make sure the needle was not within a blood vessel.  There was no blood retrieved on aspiration.   ° °Following is a summary of the muscles injected  And the amount of Botulinum toxin used: ° °Dilution °200 units of Botox was reconstituted with 4 ml of preservative free normal saline. °Time of reconstitution: At the time of the office visit (<30 minutes prior to injection)  ° °Injections  °155 total units of Botox was injected with a 30 gauge needle. ° °Injection Sites: °L occipitalis: 15 units- 3 sites  °R occiptalis: 15 units- 3 sites ° °L upper trapezius: 15 units- 3 sites °R upper trapezius: 15 units- 3 sits          °L paraspinal: 10 units- 2 sites °R paraspinal: 10 units- 2 sites ° °Face °L frontalis(2 injection sites):10 units   °R frontalis(2 injection sites):10 units         °L corrugator: 5 units   °R corrugator: 5 units           °Procerus: 5 units   °L temporalis: 20 units °R temporalis: 20 units  ° °Agent:  °200 units of botulinum Type  A (Onobotulinum Toxin type A) was reconstituted with 4 ml of preservative free normal saline.  °Time of reconstitution: At the time of the office visit (<30 minutes prior to injection)  ° ° ° Total injected (Units):  155 ° Total wasted (Units):  45 ° °Patient tolerated procedure well without complications.   °Reinjection is anticipated in 3 months. ° ° °

## 2019-07-23 ENCOUNTER — Other Ambulatory Visit (INDEPENDENT_AMBULATORY_CARE_PROVIDER_SITE_OTHER): Payer: Self-pay | Admitting: Internal Medicine

## 2019-07-27 ENCOUNTER — Telehealth: Payer: Self-pay | Admitting: *Deleted

## 2019-07-27 DIAGNOSIS — G43009 Migraine without aura, not intractable, without status migrainosus: Secondary | ICD-10-CM

## 2019-07-27 DIAGNOSIS — G43709 Chronic migraine without aura, not intractable, without status migrainosus: Secondary | ICD-10-CM

## 2019-07-27 MED ORDER — NAPROXEN 500 MG PO TABS: 500.0000 mg | ORAL_TABLET | Freq: Two times a day (BID) | ORAL | 5 refills | Status: DC | PRN

## 2019-07-27 NOTE — Telephone Encounter (Signed)
#  20, refills 5

## 2019-07-27 NOTE — Telephone Encounter (Addendum)
Faxed request from [facility] for Naproxen 500mg  every 12 hours as needed. Last time filled was 04/01/18. Called patient to ask about how often she is taking this (didn't see mentioned in assessment/plan of MD notes 3/31). She stated she wasn't taking it much with Botox injections but now is having more headaches and would like a refill on the Naproxen. Next MD appt 11/10.  Dr. Tomi Likens - if okay to refill how many/ refills?   Informed patient will check with MD and send to [facility]

## 2019-07-27 NOTE — Telephone Encounter (Signed)
Electronically sent Naproxen 500mg  Q 12 hours as needed #20 tabs with 5 refills to [facility] in [place] , [place]

## 2019-08-04 ENCOUNTER — Other Ambulatory Visit: Payer: Self-pay | Admitting: Neurology

## 2019-08-06 NOTE — Telephone Encounter (Signed)
Requested Prescriptions   Pending Prescriptions Disp Refills  . SUMAtriptan (IMITREX) 100 MG tablet [Pharmacy Med Name: SUMATRIPTAN 100MG  TABLETS] 10 tablet 3    Sig: TAKE 1 TABLET BY MOUTH AT ONSET OF HEADACHE. MAY REPEAT IN 2 HOURS IF HEADACHE PERSISTS   Rx last filled: 03/29/19 #10 3 REFILLS  Pt last seen: 01/26/19   Follow up appt scheduled:09/07/19

## 2019-08-25 ENCOUNTER — Encounter: Payer: Self-pay | Admitting: *Deleted

## 2019-08-25 NOTE — Progress Notes (Addendum)
Laurie Cherry (KeyOliver Pila)  Notice of Approval Date: 08/25/2019 Laurie Cherry Plan Member Name: Laurie Cherry Plan Member ID:  [id number] Prescriber Name: ADAM JAFFE  Dear Laurie Cherry: [facility] received a request from your prescriber for coverage of Botox. As long as you remain covered by the [facility] and there are no changes to your plan benefits, this request is approved for the following time period: 08/25/2019 - 08/24/2020 Approvals may be subject to dosing limits in accordance with FDA approved labeling, evidence-based practice guidelines or your prescription drug plan benefits. The prescription drug plan requires that this medication [facility]. If you have not done so already, a prescription can be faxed to [phone] along with a copy of this letter and the request will be processed. Sincerely, [facility] PA# [place] [phone]

## 2019-09-06 NOTE — Progress Notes (Signed)
Virtual Visit via Video Note The purpose of this virtual visit is to provide medical care while limiting exposure to the novel coronavirus.    Consent was obtained for video visit:  Yes.   Answered questions that patient had about telehealth interaction:  Yes.   I discussed the limitations, risks, security and privacy concerns of performing an evaluation and management service by telemedicine. I also discussed with the patient that there may be a patient responsible charge related to this service. The patient expressed understanding and agreed to proceed.  Pt location: Home Physician Location: office Name of referring provider:  Thea Alken, FNP I connected with Laurie Cherry at patients initiation/request on 09/07/2019 at  1:30 PM EST by video enabled telemedicine application and verified that I am speaking with the correct person using two identifiers. Pt MRN:  [id number] Pt DOB:  [birth year] Video Participants:  Laurie Cherry   History of Present Illness:  Laurie Cherry is a 58 year old right-handed female with rheumatoid arthritis, hypothyroidism, reflux and mild depression who follows up for migraines.  UPDATE: She has not seen the same efficacy of Botox since restarting it.  Over the past month, she has steadily had reduction in migraines.  She would like to remain on Botox for now. Current NSAIDS:naproxen 500mg  Current analgesics:Excedrin Migraine Current triptans:Sumatriptan 50mg  (100mg  caused drowsiness) w/wo naproxen 500mg  Current ergotamine:none Current anti-emetic:none Current muscle relaxants:none Current anti-anxiolytic:none Current sleep aide:none Current Antihypertensive medications:Propranolol ER 60mg  Current Antidepressant medications:Celexa Current Anticonvulsant medications:none Current anti-CGRP:none Current Vitamins/Herbal/Supplements:D Current Antihistamines/Decongestants:none Other therapy:Botox Hormone/birth  control: estradiol  Caffeine:1 cup of coffee daily Alcohol:no Smoker:no Diet:1/2 can of soda daily. Drinks water Exercise:no Depression:stable; Anxiety:no Other pain:no Sleep hygiene:varies  HISTORY: She has a history of "hormonal" migraines most of her life, described as pounding right temporal pain and often associated with her menstrual cycle.They have pretty much stabilized but she will still get it every now and then.  She was diagnosed with rheumatoid arthritis in 2014.She had been on methotrexate for about 2 years (stopped a month ago).In September 2015, she began having new headaches.It coincided with change in her thyroid medications.They are left sided, 7/10 non-throbbing pain. They are associated with photophobia, phonophobia, and osmophobia but not nausea.  Initially, they typically lasted3 to 4 days and occur 1 to 2 times a week (however she also has a dull daily headache) Stress is a trigger.Change in thyroid medication may have been a trigger. These headaches do not correlate with her menstrual cycle. Sumatriptan with naproxen helps relieve it.  Past NSAIDS: no Past analgesics: Excedrin, Tylenol (ineffective) Past abortive triptans: no Past muscle relaxants: no Past anti-emetic: no Past antihypertensive medications: atenolol (25mg  ineffective and higher doses caused dizziness) Past antidepressant medications: venlafaxine XR 150mg  Past anticonvulsant medications: topiramate 25mg  (caused drowsiness, but willing to retry it if needed) Past anti-CGRP: Aimovig 70mg  (she was on it for 2 months.  Ineffective) Past vitamins/Herbal/Supplements: no Past antihistamines/decongestants: no Other past therapies: no  Family history of headache:Mom, sister Mother had glioblastoma  PAST MEDICAL HISTORY: Past Medical History:  Diagnosis Date  . Depression   . Esophagitis   . Family history of adverse reaction to anesthesia     Sister  . Fibroids   . GERD (gastroesophageal reflux disease)   . Hypothyroidism   . Rheumatoid arthritis ([place])   . Rheumatoid arthritis ([place]) 01/20/2017  . Worsening headaches     MEDICATIONS: Current Outpatient Medications on File Prior to Visit  Medication Sig Dispense  Refill  . Adalimumab (HUMIRA PEN) 40 MG/0.8ML PNKT Inject 40 mg into the skin every 14 (fourteen) days. Every other week on Wednesdays.    Marland Kitchen BIOTIN PO Take 1 tablet by mouth daily at 3 pm.     . BOTOX 100 units SOLR injection Inject 100 Units as directed as directed. Every 12 weeks  On hold for now    . cimetidine (TAGAMET) 400 MG tablet TAKE 1 TABLET(400 MG) BY MOUTH TWICE DAILY 60 tablet 5  . citalopram (CELEXA) 20 MG tablet Take 10 mg by mouth at bedtime.  3  . Erenumab-aooe (AIMOVIG) 140 MG/ML SOAJ Inject 140 mg into the skin every 30 (thirty) days. 1 pen 11  . Erenumab-aooe (AIMOVIG) 70 MG/ML SOAJ Inject 70 mg into the skin every 30 (thirty) days. 1 pen 3  . estradiol (ESTRACE) 2 MG tablet Take 0.5 mg by mouth daily.   5  . folic acid (FOLVITE) 1 MG tablet Take 1 mg by mouth daily.     Marland Kitchen ibuprofen (ADVIL,MOTRIN) 200 MG tablet Take 400-600 mg by mouth every 8 (eight) hours as needed (headaches/pain.).    Marland Kitchen Levothyroxine Sodium (TIROSINT) 88 MCG CAPS Take 1 capsule (88 mcg total) by mouth daily before breakfast. 90 capsule 4  . loratadine (CLARITIN) 10 MG tablet Take 10 mg by mouth daily.    . methotrexate (RHEUMATREX) 2.5 MG tablet Take 5 mg by mouth every Sunday. Caution:Chemotherapy. Protect from light.     . montelukast (SINGULAIR) 10 MG tablet TAKE 1 TABLET(10 MG) BY MOUTH AT BEDTIME 90 tablet 3  . naproxen (NAPROSYN) 500 MG tablet Take 1 tablet (500 mg total) by mouth every 12 (twelve) hours as needed. 1 tablet (500mg ) by mouth every 12 hours as needed 20 tablet 5  . propranolol ER (INDERAL LA) 60 MG 24 hr capsule TAKE 1 CAPSULE(60 MG) BY MOUTH DAILY 90 capsule 1  . SUMAtriptan (IMITREX) 100 MG tablet TAKE 1  TABLET BY MOUTH AT ONSET OF HEADACHE. MAY REPEAT IN 2 HOURS IF HEADACHE PERSISTS 10 tablet 3  . Tetrahydrozoline-Zn Sulfate (ALLERGY RELIEF EYE DROPS OP) Place 1-2 drops into both eyes 3 (three) times daily as needed (FOR IRRITATED/ALLERGY EYES).    . Vitamin D, Ergocalciferol, (DRISDOL) 50000 units CAPS capsule Take 50,000 Units by mouth every Sunday.      No current facility-administered medications on file prior to visit.     ALLERGIES: Allergies  Allergen Reactions  . Arava [Leflunomide] Hives  . Azithromycin Other (See Comments)    Severe bloating, abd pain  . Cefuroxime Axetil Other (See Comments)    Increased bloating, abd pain  . Clarithromycin Other (See Comments)    abd pain  . Etanercept Hives and Other (See Comments)    Injection site irritation (ENBREL)  . Levofloxacin Hives and Other (See Comments)    Severe dizziness  . Sulfa Antibiotics Hives and Other (See Comments)    abd pain  . Methotrexate Derivatives Other (See Comments)    Injection ONLY (irritation at the site of injection)    FAMILY HISTORY: Family History  Problem Relation Age of Onset  . Thyroid disease Mother   . Heart attack Mother   . Stroke Mother   . Diabetes Mother   . Brain cancer Mother   . Colon cancer Father   . Heart attack Sister   . Hypertension Sister   . Hypertension Brother    SOCIAL HISTORY: Social History   Socioeconomic History  . Marital status: Married  Spouse name: Not on file  . Number of children: Not on file  . Years of education: Not on file  . Highest education level: Not on file  Occupational History  . Not on file  Social Needs  . Financial resource strain: Not on file  . Food insecurity    Worry: Not on file    Inability: Not on file  . Transportation needs    Medical: Not on file    Non-medical: Not on file  Tobacco Use  . Smoking status: Never Smoker  . Smokeless tobacco: Never Used  Substance and Sexual Activity  . Alcohol use: No     Alcohol/week: 0.0 standard drinks  . Drug use: No  . Sexual activity: Not on file  Lifestyle  . Physical activity    Days per week: Not on file    Minutes per session: Not on file  . Stress: Not on file  Relationships  . Social Herbalist on phone: Not on file    Gets together: Not on file    Attends religious service: Not on file    Active member of club or organization: Not on file    Attends meetings of clubs or organizations: Not on file    Relationship status: Not on file  . Intimate partner violence    Fear of current or ex partner: Not on file    Emotionally abused: Not on file    Physically abused: Not on file    Forced sexual activity: Not on file  Other Topics Concern  . Not on file  Social History Narrative  . Not on file    REVIEW OF SYSTEMS: Constitutional: No fevers, chills, or sweats, no generalized fatigue, change in appetite Eyes: No visual changes, double vision, eye pain Ear, nose and throat: No hearing loss, ear pain, nasal congestion, sore throat Cardiovascular: No chest pain, palpitations Respiratory:  No shortness of breath at rest or with exertion, wheezes GastrointestinaI: No nausea, vomiting, diarrhea, abdominal pain, fecal incontinence Genitourinary:  No dysuria, urinary retention or frequency Musculoskeletal:  No neck pain, back pain Integumentary: No rash, pruritus, skin lesions Neurological: as above Psychiatric: No depression, insomnia, anxiety Endocrine: No palpitations, fatigue, diaphoresis, mood swings, change in appetite, change in weight, increased thirst Hematologic/Lymphatic:  No purpura, petechiae. Allergic/Immunologic: no itchy/runny eyes, nasal congestion, recent allergic reactions, rashes  PHYSICAL EXAM: Height 5\' 2"  (1.575 m), weight 133 lb (60.3 kg), last menstrual period 03/31/2017. General: No acute distress.  Patient appears well-groomed.   Head:  Normocephalic/atraumatic Eyes:  Fundi examined but not visualized  Neck: supple, no paraspinal tenderness, full range of motion Heart:  Regular rate and rhythm Lungs:  Clear to auscultation bilaterally Back: No paraspinal tenderness Neurological Exam: alert and oriented to person, place, and time. Attention span and concentration intact, recent and remote memory intact, fund of knowledge intact.  Speech fluent and not dysarthric, language intact.  CN II-XII intact. Bulk and tone normal, muscle strength 5/5 throughout.  Sensation to light touch, temperature and vibration intact.  Deep tendon reflexes 2+ throughout, toes downgoing.  Finger to nose and heel to shin testing intact.  Gait normal, Romberg negative.  IMPRESSION: Migraine without aura, without status migrainosus, not intractable  PLAN: 1.  For preventative management, she would like to continue Botox for now 2.  For abortive therapy, sumatriptan 50mg  3.  Limit use of pain relievers to no more than 2 days out of week to prevent risk of rebound or  medication-overuse headache. 4.  Keep headache diary 5.  Exercise, hydration, caffeine cessation, sleep hygiene, monitor for and avoid triggers 6.  Consider:  magnesium citrate 400mg  daily, riboflavin 400mg  daily, and coenzyme Q10 100mg  three times daily 7. Always keep in mind that currently taking a hormone or birth control may be a possible trigger or aggravating factor for migraine. 8. Follow up for Botox on 12/4.  15 minutes spent face to face with patient, over 50% spent discussing management.  Metta Clines, DO  CC: Ephriam Jenkins

## 2019-09-07 ENCOUNTER — Telehealth (INDEPENDENT_AMBULATORY_CARE_PROVIDER_SITE_OTHER): Payer: BC Managed Care – PPO | Admitting: Neurology

## 2019-09-07 ENCOUNTER — Encounter: Payer: Self-pay | Admitting: Neurology

## 2019-09-07 ENCOUNTER — Other Ambulatory Visit: Payer: Self-pay

## 2019-09-07 VITALS — Ht 62.0 in | Wt 133.0 lb

## 2019-09-07 DIAGNOSIS — G43009 Migraine without aura, not intractable, without status migrainosus: Secondary | ICD-10-CM | POA: Diagnosis not present

## 2019-09-27 ENCOUNTER — Telehealth: Payer: Self-pay | Admitting: Neurology

## 2019-09-27 NOTE — Telephone Encounter (Signed)
Gregary Signs from [place] called regarding this patient's Botox being delayed and will now be delivered tomorrow 09/28/2019. Thank you

## 2019-09-27 NOTE — Telephone Encounter (Signed)
Noted  

## 2019-09-29 NOTE — Telephone Encounter (Signed)
[facility] spoke with Vicente Males to follow up on patient Botox.   They will have Botox ship to office on Thursday. Pt also aware of this.   100 units QTY 2 vials will be ship delivery tomorrow.

## 2019-09-29 NOTE — Telephone Encounter (Signed)
Called spoke with Herbie Baltimore at [place] patient medication did not arrive yesterday. Calling to follow up on shipment. Botox has not arrived  Phone disconnection waiting for return call

## 2019-10-01 ENCOUNTER — Telehealth: Payer: Self-pay

## 2019-10-01 ENCOUNTER — Ambulatory Visit: Payer: BC Managed Care – PPO | Admitting: Neurology

## 2019-10-01 NOTE — Telephone Encounter (Signed)
Called patient to give her a update that her Botox has not arrived to our office. No answer left message on voice.  If Botox is not hear by 1:00 pm patient will need to r/s. Botox may be delayed. If it arrived Dr. Tomi Likens maybe able to work her in for later on today.

## 2019-10-12 ENCOUNTER — Telehealth: Payer: Self-pay

## 2019-10-12 NOTE — Telephone Encounter (Signed)
Botox to be delievered Thursday the 17th [phone].

## 2019-10-13 ENCOUNTER — Telehealth: Payer: Self-pay | Admitting: Neurology

## 2019-10-13 NOTE — Telephone Encounter (Signed)
Called to follow up on shipment yesterday. [facility] would be here on Thursday. Will informed patient of this.

## 2019-10-13 NOTE — Telephone Encounter (Signed)
Called patient no answer left message shipment is scheduled for tomorrow.

## 2019-10-13 NOTE — Telephone Encounter (Signed)
Patient is checking ot make sure her Botox is here for the appt on 10-15-19 please call and let her know

## 2019-10-15 ENCOUNTER — Other Ambulatory Visit: Payer: Self-pay

## 2019-10-15 ENCOUNTER — Ambulatory Visit (INDEPENDENT_AMBULATORY_CARE_PROVIDER_SITE_OTHER): Payer: BC Managed Care – PPO | Admitting: Neurology

## 2019-10-15 DIAGNOSIS — G43709 Chronic migraine without aura, not intractable, without status migrainosus: Secondary | ICD-10-CM

## 2019-10-15 MED ORDER — ONABOTULINUMTOXINA 100 UNITS IJ SOLR
155.0000 [IU] | Freq: Once | INTRAMUSCULAR | Status: AC
  Administered 2019-10-15: 14:00:00 155 [IU] via INTRAMUSCULAR

## 2019-10-15 NOTE — Progress Notes (Signed)
[facility] Clinic  ° °Procedure Note Botox ° °Attending: Dr. Josh Nicolosi ° °Preoperative Diagnosis(es): Chronic migraine ° °Consent obtained from: The patient °Benefits discussed included, but were not limited to decreased muscle tightness, increased joint range of motion, and decreased pain.  Risk discussed included, but were not limited pain and discomfort, bleeding, bruising, excessive weakness, venous thrombosis, muscle atrophy and dysphagia.  Anticipated outcomes of the procedure as well as he risks and benefits of the alternatives to the procedure, and the roles and tasks of the personnel to be involved, were discussed with the patient, and the patient consents to the procedure and agrees to proceed. A copy of the patient medication guide was given to the patient which explains the blackbox warning. ° °Patients identity and treatment sites confirmed Yes.  . ° °Details of Procedure: °Skin was cleaned with alcohol. Prior to injection, the needle plunger was aspirated to make sure the needle was not within a blood vessel.  There was no blood retrieved on aspiration.   ° °Following is a summary of the muscles injected  And the amount of Botulinum toxin used: ° °Dilution °200 units of Botox was reconstituted with 4 ml of preservative free normal saline. °Time of reconstitution: At the time of the office visit (<30 minutes prior to injection)  ° °Injections  °155 total units of Botox was injected with a 30 gauge needle. ° °Injection Sites: °L occipitalis: 15 units- 3 sites  °R occiptalis: 15 units- 3 sites ° °L upper trapezius: 15 units- 3 sites °R upper trapezius: 15 units- 3 sits          °L paraspinal: 10 units- 2 sites °R paraspinal: 10 units- 2 sites ° °Face °L frontalis(2 injection sites):10 units   °R frontalis(2 injection sites):10 units         °L corrugator: 5 units   °R corrugator: 5 units           °Procerus: 5 units   °L temporalis: 20 units °R temporalis: 20 units  ° °Agent:  °200 units of botulinum Type  A (Onobotulinum Toxin type A) was reconstituted with 4 ml of preservative free normal saline.  °Time of reconstitution: At the time of the office visit (<30 minutes prior to injection)  ° ° ° Total injected (Units):  155 ° Total wasted (Units):  45 ° °Patient tolerated procedure well without complications.   °Reinjection is anticipated in 3 months. ° ° °

## 2019-10-15 NOTE — Progress Notes (Signed)
Pt supplied medication [facility] 45 units wasted

## 2019-10-18 ENCOUNTER — Ambulatory Visit: Payer: BC Managed Care – PPO | Admitting: "Endocrinology

## 2019-11-09 ENCOUNTER — Ambulatory Visit: Payer: BC Managed Care – PPO | Admitting: "Endocrinology

## 2019-11-16 ENCOUNTER — Telehealth: Payer: Self-pay

## 2019-11-16 ENCOUNTER — Other Ambulatory Visit: Payer: Self-pay | Admitting: "Endocrinology

## 2019-11-16 ENCOUNTER — Other Ambulatory Visit: Payer: Self-pay

## 2019-11-16 DIAGNOSIS — E039 Hypothyroidism, unspecified: Secondary | ICD-10-CM

## 2019-11-16 NOTE — Telephone Encounter (Signed)
Please reorder labs to [facility]

## 2019-11-16 NOTE — Telephone Encounter (Signed)
Done

## 2019-11-17 LAB — T4, FREE: Free T4: 1.4 ng/dL (ref 0.8–1.8)

## 2019-11-17 LAB — TSH: TSH: 1.3 mIU/L (ref 0.40–4.50)

## 2019-11-17 LAB — THYROID PEROXIDASE ANTIBODY: Thyroperoxidase Ab SerPl-aCnc: <1 IU/mL (ref <9)

## 2019-11-17 LAB — THYROGLOBULIN ANTIBODY: Thyroglobulin Ab: <1 IU/mL (ref <=1)

## 2019-11-18 ENCOUNTER — Encounter: Payer: Self-pay | Admitting: "Endocrinology

## 2019-11-18 ENCOUNTER — Ambulatory Visit (INDEPENDENT_AMBULATORY_CARE_PROVIDER_SITE_OTHER): Payer: BC Managed Care – PPO | Admitting: "Endocrinology

## 2019-11-18 DIAGNOSIS — E039 Hypothyroidism, unspecified: Secondary | ICD-10-CM | POA: Diagnosis not present

## 2019-11-18 MED ORDER — LEVOTHYROXINE SODIUM 88 MCG PO CAPS: 88.0000 ug | ORAL_CAPSULE | Freq: Every day | ORAL | 4 refills | Status: DC

## 2019-11-18 NOTE — Progress Notes (Signed)
11/18/2019                                Endocrinology Telehealth Visit Follow up Note -During COVID -19 Pandemic  I connected with Laurie Cherry on 11/18/2019   by telephone and verified that I am speaking with the correct person using two identifiers. Laurie Cherry, [birth year]. she has verbally consented to this visit. All issues noted in this document were discussed and addressed. The format was not optimal for physical exam.    Subjective:    Patient ID: Laurie Cherry, female    DOB: 1961/09/07, PCP Thea Alken   Past Medical History:  Diagnosis Date  . Depression   . Esophagitis   . Family history of adverse reaction to anesthesia    Sister  . Fibroids   . GERD (gastroesophageal reflux disease)   . Hypothyroidism   . Rheumatoid arthritis ([facility])   . Rheumatoid arthritis ([place]) 01/20/2017  . Worsening headaches    Past Surgical History:  Procedure Laterality Date  . ABDOMINAL HYSTERECTOMY N/A 04/03/2017   Procedure: TOTAL HYSTERECTOMY ABDOMINAL;  Surgeon: Louretta Shorten, MD;  [facility];  Service: Gynecology;  Laterality: N/A;  . BIOPSY  04/10/2018   Procedure: BIOPSY;  Surgeon: Rogene Houston, MD;  [facility];  Service: Endoscopy;;  esophegus   . BIOPSY  10/19/2018   Procedure: BIOPSY;  Surgeon: Rogene Houston, MD;  [facility];  Service: Endoscopy;;  esophageal   . COLONOSCOPY    . COLONOSCOPY    . COLONOSCOPY N/A 04/10/2018   Procedure: COLONOSCOPY;  Surgeon: Rogene Houston, MD;  [facility];  Service: Endoscopy;  Laterality: N/A;  . ESOPHAGEAL DILATION N/A 04/10/2018   Procedure: ESOPHAGEAL DILATION;  Surgeon: Rogene Houston, MD;  [facility];  Service: Endoscopy;  Laterality: N/A;  balloon dilation  . ESOPHAGEAL DILATION N/A 10/19/2018   Procedure: ESOPHAGEAL DILATION;  Surgeon: Rogene Houston, MD;  [facility];  Service: Endoscopy;  Laterality: N/A;  . ESOPHAGOGASTRODUODENOSCOPY     . ESOPHAGOGASTRODUODENOSCOPY N/A 04/10/2018   Procedure: ESOPHAGOGASTRODUODENOSCOPY (EGD);  Surgeon: Rogene Houston, MD;  [facility];  Service: Endoscopy;  Laterality: N/A;  1:25  . ESOPHAGOGASTRODUODENOSCOPY N/A 10/19/2018   Procedure: ESOPHAGOGASTRODUODENOSCOPY (EGD);  Surgeon: Rogene Houston, MD;  [facility];  Service: Endoscopy;  Laterality: N/A;  7:30  . EYE SURGERY Right   . SALPINGOOPHORECTOMY Bilateral 04/03/2017   Procedure: SALPINGO OOPHORECTOMY;  Surgeon: Louretta Shorten, MD;  [facility];  Service: Gynecology;  Laterality: Bilateral;   Social History   Socioeconomic History  . Marital status: Married    Spouse name: Not on file  . Number of children: 1  . Years of education: Not on file  . Highest education level: Master's degree (e.g., MA, MS, MEng, MEd, MSW, MBA)  Occupational History  . Not on file  Tobacco Use  . Smoking status: Never Smoker  . Smokeless tobacco: Never Used  Substance and Sexual Activity  . Alcohol use: No    Alcohol/week: 0.0 standard drinks  . Drug use: No  . Sexual activity: Not on file  Other Topics Concern  . Not on file  Social History Narrative   Pt is married lives with spouse in 2 story home she has 1 children, Master Degree in Education   She is right handed, and  drinks coffee once a day, 1 soda a day and not tea   She does not work out at all   Social Determinants of Radio broadcast assistant Strain:   . Difficulty of Paying Living Expenses: Not on file  Food Insecurity:   . Worried About Charity fundraiser in the Last Year: Not on file  . Ran Out of Food in the Last Year: Not on file  Transportation Needs:   . Lack of Transportation (Medical): Not on file  . Lack of Transportation (Non-Medical): Not on file  Physical Activity:   . Days of Exercise per Week: Not on file  . Minutes of Exercise per Session: Not on file  Stress:   . Feeling of Stress : Not on file  Social Connections:   .  Frequency of Communication with Friends and Family: Not on file  . Frequency of Social Gatherings with Friends and Family: Not on file  . Attends Religious Services: Not on file  . Active Member of Clubs or Organizations: Not on file  . Attends Archivist Meetings: Not on file  . Marital Status: Not on file   Outpatient Encounter Medications as of 11/18/2019  Medication Sig  . Adalimumab (HUMIRA PEN) 40 MG/0.8ML PNKT Inject 40 mg into the skin every 14 (fourteen) days. Every other week on Wednesdays.  Marland Kitchen BIOTIN PO Take 1 tablet by mouth daily at 3 pm.   . BOTOX 100 units SOLR injection Inject 100 Units as directed as directed. Every 12 weeks  On hold for now  . cimetidine (TAGAMET) 400 MG tablet TAKE 1 TABLET(400 MG) BY MOUTH TWICE DAILY  . citalopram (CELEXA) 20 MG tablet Take 10 mg by mouth at bedtime.  Eduard Roux (AIMOVIG) 140 MG/ML SOAJ Inject 140 mg into the skin every 30 (thirty) days.  Eduard Roux (AIMOVIG) 70 MG/ML SOAJ Inject 70 mg into the skin every 30 (thirty) days.  Marland Kitchen estradiol (ESTRACE) 2 MG tablet Take 0.5 mg by mouth daily.   . folic acid (FOLVITE) 1 MG tablet Take 1 mg by mouth daily.   Marland Kitchen ibuprofen (ADVIL,MOTRIN) 200 MG tablet Take 400-600 mg by mouth every 8 (eight) hours as needed (headaches/pain.).  Marland Kitchen Levothyroxine Sodium (TIROSINT) 88 MCG CAPS Take 1 capsule (88 mcg total) by mouth daily before breakfast.  . loratadine (CLARITIN) 10 MG tablet Take 10 mg by mouth daily.  . methotrexate (RHEUMATREX) 2.5 MG tablet Take 5 mg by mouth every Sunday. Caution:Chemotherapy. Protect from light.   . montelukast (SINGULAIR) 10 MG tablet TAKE 1 TABLET(10 MG) BY MOUTH AT BEDTIME  . naproxen (NAPROSYN) 500 MG tablet Take 1 tablet (500 mg total) by mouth every 12 (twelve) hours as needed. 1 tablet (500mg ) by mouth every 12 hours as needed  . propranolol ER (INDERAL LA) 60 MG 24 hr capsule TAKE 1 CAPSULE(60 MG) BY MOUTH DAILY  . SUMAtriptan (IMITREX) 100 MG tablet TAKE  1 TABLET BY MOUTH AT ONSET OF HEADACHE. MAY REPEAT IN 2 HOURS IF HEADACHE PERSISTS  . Tetrahydrozoline-Zn Sulfate (ALLERGY RELIEF EYE DROPS OP) Place 1-2 drops into both eyes 3 (three) times daily as needed (FOR IRRITATED/ALLERGY EYES).  . Vitamin D, Ergocalciferol, (DRISDOL) 50000 units CAPS capsule Take 50,000 Units by mouth every Sunday.   . [DISCONTINUED] Levothyroxine Sodium (TIROSINT) 88 MCG CAPS Take 1 capsule (88 mcg total) by mouth daily before breakfast.   No facility-administered encounter medications on file as of 11/18/2019.   ALLERGIES: Allergies  Allergen Reactions  .  Arava [Leflunomide] Hives  . Azithromycin Other (See Comments)    Severe bloating, abd pain  . Cefuroxime Axetil Other (See Comments)    Increased bloating, abd pain  . Clarithromycin Other (See Comments)    abd pain  . Etanercept Hives and Other (See Comments)    Injection site irritation (ENBREL)  . Levofloxacin Hives and Other (See Comments)    Severe dizziness  . Sulfa Antibiotics Hives and Other (See Comments)    abd pain  . Methotrexate Derivatives Other (See Comments)    Injection ONLY (irritation at the site of injection)   VACCINATION STATUS:  There is no immunization history on file for this patient.  HPI  59 year old female patient with medical history as above. She is being seen in follow-up for longstanding hypothyroidism.      She states that she was diagnosed with hypothyroidism approximately at age 40. -She is currently on Tirosint 88 mcg p.o. every morning.  This is due to her intolerance to regular levothyroxine, Synthroid.  She reports compliance to this medication. -This is her yearly follow-up.  She denies any new complaints today.  - She has family  history of thyroid dysfunction in multiple members of her family.  She denies any history of goiter. She denies any family history of thyroid cancer. - She underwent total abdominal hysterectomy/ salphigo oophorectomy due to a  large ovarian cyst. Findings were reportedly benign.  -She is on multiple medications for her rheumatoid arthritis.   Review of Systems Limited as above.  Objective:    LMP 03/31/2017   Wt Readings from Last 3 Encounters:  09/07/19 133 lb (60.3 kg)  03/24/19 136 lb 8 oz (61.9 kg)  01/26/19 130 lb (59 kg)    Physical Exam    Recent Results (from the past 2160 hour(s))  Thyroglobulin antibody     Status: None   Collection Time: 11/16/19  2:06 PM  Result Value Ref Range   Thyroglobulin Ab <1 < or = 1 IU/mL  TSH     Status: None   Collection Time: 11/16/19  2:06 PM  Result Value Ref Range   TSH 1.30 0.40 - 4.50 mIU/L  T4, Free     Status: None   Collection Time: 11/16/19  2:06 PM  Result Value Ref Range   Free T4 1.4 0.8 - 1.8 ng/dL  Thyroid Peroxidase Antibody     Status: None   Collection Time: 11/16/19  2:06 PM  Result Value Ref Range   Thyroperoxidase Ab SerPl-aCnc <1 <9 IU/mL    - On 08/16/2016 her free T4 was 1.36, improving from prior studies. - On 05/09/2016: Free T4 0.63, TSH 7.58, TPO antibodies 13  Assessment & Plan:   1.  hypothyroidism -Her previsit thyroid function tests are consistent with appropriate replacement.   She is advised to continue Tirosint 88 mcg p.o. daily before breakfast.     - We discussed about the correct intake of her thyroid hormone, on empty stomach at fasting, with water, separated by at least 30 minutes from breakfast and other medications,  and separated by more than 4 hours from calcium, iron, multivitamins, acid reflux medications (PPIs). -Patient is made aware of the fact that thyroid hormone replacement is needed for life, dose to be adjusted by periodic monitoring of thyroid function tests.  -She has no clinical goiter, or thyroid ultrasound from July 2019 was unremarkable except for shrunk bilateral thyroid lobes.    - I advised patient to maintain close follow up  with Ephriam Jenkins E for primary care needs.     - Time  spent on this patient care encounter:  25 minutes of which 50% was spent in  counseling and the rest reviewing  her current and  previous labs / studies and medications  doses and developing a plan for long term care. Laurie Cherry  participated in the discussions, expressed understanding, and voiced agreement with the above plans.  All questions were answered to her satisfaction. she is encouraged to contact clinic should she have any questions or concerns prior to her return visit.  Follow up plan: Return in about 1 year (around 11/17/2020) for Follow up with Pre-visit Labs.  Glade Lloyd, MD Phone: [phone]  Fax: [phone]   11/18/2019, 8:35 AM

## 2019-12-08 ENCOUNTER — Other Ambulatory Visit: Payer: Self-pay | Admitting: Neurology

## 2019-12-08 DIAGNOSIS — G43009 Migraine without aura, not intractable, without status migrainosus: Secondary | ICD-10-CM

## 2019-12-22 ENCOUNTER — Telehealth: Payer: Self-pay | Admitting: *Deleted

## 2019-12-22 NOTE — Telephone Encounter (Signed)
Called Burlie to let her know I contacted [facility] to initiate shipment of her Botox for her next appointment. They will ship for it to arrive 01/06/2020. (Her next injection is 01/14/2020). I asked her to call [phone] [facility] to take care of copay and her approval for them to ship to [place] for arrival on that date. We discussed that as of January 27 2020 [facility] will no longer dispense [facility] and we will figure out what we will do as that time approaches. Their recommendation is to use buy and bill which we will do if the insurance approves it and agrees to pay for it.

## 2019-12-23 ENCOUNTER — Other Ambulatory Visit (INDEPENDENT_AMBULATORY_CARE_PROVIDER_SITE_OTHER): Payer: Self-pay | Admitting: Internal Medicine

## 2020-01-14 ENCOUNTER — Other Ambulatory Visit: Payer: Self-pay

## 2020-01-14 ENCOUNTER — Ambulatory Visit (INDEPENDENT_AMBULATORY_CARE_PROVIDER_SITE_OTHER): Payer: PRIVATE HEALTH INSURANCE | Admitting: Neurology

## 2020-01-14 ENCOUNTER — Ambulatory Visit: Payer: BC Managed Care – PPO | Admitting: Neurology

## 2020-01-14 DIAGNOSIS — G43009 Migraine without aura, not intractable, without status migrainosus: Secondary | ICD-10-CM

## 2020-01-14 DIAGNOSIS — G43709 Chronic migraine without aura, not intractable, without status migrainosus: Secondary | ICD-10-CM

## 2020-01-14 MED ORDER — ONABOTULINUMTOXINA 100 UNITS IJ SOLR
200.0000 [IU] | Freq: Once | INTRAMUSCULAR | Status: AC
  Administered 2020-01-14: 13:00:00 200 [IU] via INTRAMUSCULAR

## 2020-01-14 NOTE — Progress Notes (Signed)
[facility]  ° °Procedure Note Botox ° °Attending: Dr. Jadan Hinojos ° °Preoperative Diagnosis(es): Chronic migraine ° °Consent obtained from: The patient °Benefits discussed included, but were not limited to decreased muscle tightness, increased joint range of motion, and decreased pain.  Risk discussed included, but were not limited pain and discomfort, bleeding, bruising, excessive weakness, venous thrombosis, muscle atrophy and dysphagia.  Anticipated outcomes of the procedure as well as he risks and benefits of the alternatives to the procedure, and the roles and tasks of the personnel to be involved, were discussed with the patient, and the patient consents to the procedure and agrees to proceed. A copy of the patient medication guide was given to the patient which explains the blackbox warning. ° °Patients identity and treatment sites confirmed Yes.  . ° °Details of Procedure: °Skin was cleaned with alcohol. Prior to injection, the needle plunger was aspirated to make sure the needle was not within a blood vessel.  There was no blood retrieved on aspiration.   ° °Following is a summary of the muscles injected  And the amount of Botulinum toxin used: ° °Dilution °200 units of Botox was reconstituted with 4 ml of preservative free normal saline. °Time of reconstitution: At the time of the office visit (<30 minutes prior to injection)  ° °Injections  °155 total units of Botox was injected with a 30 gauge needle. ° °Injection Sites: °L occipitalis: 15 units- 3 sites  °R occiptalis: 15 units- 3 sites ° °L upper trapezius: 15 units- 3 sites °R upper trapezius: 15 units- 3 sits          °L paraspinal: 10 units- 2 sites °R paraspinal: 10 units- 2 sites ° °Face °L frontalis(2 injection sites):10 units   °R frontalis(2 injection sites):10 units         °L corrugator: 5 units   °R corrugator: 5 units           °Procerus: 5 units   °L temporalis: 20 units °R temporalis: 20 units  ° °Agent:  °200 units of botulinum Type  A (Onobotulinum Toxin type A) was reconstituted with 4 ml of preservative free normal saline.  °Time of reconstitution: At the time of the office visit (<30 minutes prior to injection)  ° ° ° Total injected (Units):  155 ° Total wasted (Units):  45 ° °Patient tolerated procedure well without complications.   °Reinjection is anticipated in 3 months. ° ° °

## 2020-02-28 ENCOUNTER — Ambulatory Visit: Payer: BC Managed Care – PPO | Admitting: Neurology

## 2020-03-12 ENCOUNTER — Other Ambulatory Visit: Payer: Self-pay | Admitting: Neurology

## 2020-03-18 ENCOUNTER — Other Ambulatory Visit (INDEPENDENT_AMBULATORY_CARE_PROVIDER_SITE_OTHER): Payer: Self-pay | Admitting: Internal Medicine

## 2020-03-23 ENCOUNTER — Ambulatory Visit (INDEPENDENT_AMBULATORY_CARE_PROVIDER_SITE_OTHER): Payer: BC Managed Care – PPO | Admitting: Gastroenterology

## 2020-03-23 ENCOUNTER — Ambulatory Visit (INDEPENDENT_AMBULATORY_CARE_PROVIDER_SITE_OTHER): Payer: BC Managed Care – PPO | Admitting: Nurse Practitioner

## 2020-03-28 ENCOUNTER — Encounter (INDEPENDENT_AMBULATORY_CARE_PROVIDER_SITE_OTHER): Payer: Self-pay | Admitting: Gastroenterology

## 2020-03-28 ENCOUNTER — Other Ambulatory Visit: Payer: Self-pay

## 2020-03-28 ENCOUNTER — Ambulatory Visit (INDEPENDENT_AMBULATORY_CARE_PROVIDER_SITE_OTHER): Payer: BC Managed Care – PPO | Admitting: Gastroenterology

## 2020-03-28 ENCOUNTER — Encounter (INDEPENDENT_AMBULATORY_CARE_PROVIDER_SITE_OTHER): Payer: Self-pay | Admitting: *Deleted

## 2020-03-28 ENCOUNTER — Other Ambulatory Visit (INDEPENDENT_AMBULATORY_CARE_PROVIDER_SITE_OTHER): Payer: Self-pay | Admitting: *Deleted

## 2020-03-28 VITALS — BP 133/76 | HR 82 | Temp 97.3°F | Ht 62.0 in | Wt 130.0 lb

## 2020-03-28 DIAGNOSIS — R1310 Dysphagia, unspecified: Secondary | ICD-10-CM

## 2020-03-28 NOTE — Progress Notes (Signed)
Patient profile: Laurie Cherry is a 59 y.o. female seen for follow up, last seen 02/2019.   History of Present Illness: Laurie Cherry is seen today for evaluation dysphagia.  She reports after her endoscopy in 2019 her dysphagia completely resolved.  Is returned over the past few weeks.  She is noticing frequent hiccups which cause her to stop eating and feeling of something is lodged in her esophagus.  She has had to regurgitate foods 1 time.  Trigger foods include bread and dry meat.  Seem to stop her lower esophageal sphincter.  No issues with liquids or pills.  She denies any nausea vomiting.  She takes Tagamet twice a day for GERD which usually works well but does occasionally supplement refractory symptoms with apple cider vinegar Gummies.  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  Takes NSAIDs a few times a week for chronic migraines  Wt Readings from Last 3 Encounters:  03/28/20 130 lb (59 kg)  09/07/19 133 lb (60.3 kg)  03/24/19 136 lb 8 oz (61.9 kg)     Last Colonoscopy: 03/2018-normal Last Endoscopy: 09/2018-Distal esophagus with mild mucosal changes with circumferential folds, grade B esophagitis, intrinsic stenosis 36 cm, dilated.  2cm hiatal hernia.   Past Medical History:  Past Medical History:  Diagnosis Date   Depression    Esophagitis    Family history of adverse reaction to anesthesia    Sister   Fibroids    GERD (gastroesophageal reflux disease)    Hypothyroidism    Rheumatoid arthritis ([place])    Rheumatoid arthritis ([place]) 01/20/2017   Worsening headaches     Problem List: Patient Active Problem List   Diagnosis Date Noted   Dysphagia 10/08/2018   Esophageal dysphagia 03/20/2018   Family hx of colon cancer 03/20/2018   S/P TAH (total abdominal hysterectomy) 04/03/2017   Rheumatoid  arthritis ([place]) 01/20/2017   Hypothyroidism 03/29/2016   Chronic migraine without aura without status migrainosus, not intractable 10/27/2015    Past Surgical History: Past Surgical History:  Procedure Laterality Date   ABDOMINAL HYSTERECTOMY N/A 04/03/2017   Procedure: TOTAL HYSTERECTOMY ABDOMINAL;  Surgeon: Louretta Shorten, MD;  [facility];  Service: Gynecology;  Laterality: N/A;   BIOPSY  04/10/2018   Procedure: BIOPSY;  Surgeon: Rogene Houston, MD;  [facility];  Service: Endoscopy;;  esophegus    BIOPSY  10/19/2018   Procedure: BIOPSY;  Surgeon: Rogene Houston, MD;  [facility];  Service: Endoscopy;;  esophageal    COLONOSCOPY     COLONOSCOPY     COLONOSCOPY N/A 04/10/2018   Procedure: COLONOSCOPY;  Surgeon: Rogene Houston, MD;  [facility];  Service: Endoscopy;  Laterality: N/A;   ESOPHAGEAL DILATION N/A 04/10/2018   Procedure: ESOPHAGEAL DILATION;  Surgeon: Rogene Houston, MD;  [facility];  Service: Endoscopy;  Laterality: N/A;  balloon dilation   ESOPHAGEAL DILATION N/A 10/19/2018   Procedure: ESOPHAGEAL DILATION;  Surgeon: Rogene Houston, MD;  [facility];  Service: Endoscopy;  Laterality: N/A;   ESOPHAGOGASTRODUODENOSCOPY     ESOPHAGOGASTRODUODENOSCOPY N/A 04/10/2018   Procedure: ESOPHAGOGASTRODUODENOSCOPY (EGD);  Surgeon: Rogene Houston, MD;  [facility];  Service: Endoscopy;  Laterality: N/A;  1:25   ESOPHAGOGASTRODUODENOSCOPY N/A 10/19/2018   Procedure: ESOPHAGOGASTRODUODENOSCOPY (EGD);  Surgeon: Rogene Houston,  MD;  [facility];  Service: Endoscopy;  Laterality: N/A;  7:30   EYE SURGERY Right    SALPINGOOPHORECTOMY Bilateral 04/03/2017   Procedure: SALPINGO OOPHORECTOMY;  Surgeon: Louretta Shorten, MD;  [facility];  Service: Gynecology;  Laterality: Bilateral;    Allergies: Allergies  Allergen Reactions   Arava [Leflunomide] Hives   Azithromycin Other (See  Comments)    Severe bloating, abd pain   Cefuroxime Axetil Other (See Comments)    Increased bloating, abd pain   Clarithromycin Other (See Comments)    abd pain   Crestor [Rosuvastatin]     Per patient she had more joint pain   Etanercept Hives and Other (See Comments)    Injection site irritation (ENBREL)   Levofloxacin Hives and Other (See Comments)    Severe dizziness   Sulfa Antibiotics Hives and Other (See Comments)    abd pain   Methotrexate Derivatives Other (See Comments)    Injection ONLY (irritation at the site of injection)      Home Medications:  Current Outpatient Medications:    Adalimumab (HUMIRA PEN) 40 MG/0.8ML PNKT, Inject 40 mg into the skin every 14 (fourteen) days. Every other week on Wednesdays., Disp: , Rfl:    BIOTIN PO, Take 1 tablet by mouth daily at 3 pm. , Disp: , Rfl:    BOTOX 100 units SOLR injection, Inject 100 Units as directed as directed. Every 12 weeks  On hold for now, Disp: , Rfl:    cimetidine (TAGAMET) 400 MG tablet, TAKE 1 TABLET(400 MG) BY MOUTH TWICE DAILY, Disp: 60 tablet, Rfl: 5   citalopram (CELEXA) 20 MG tablet, Take 10 mg by mouth at bedtime., Disp: , Rfl: 3   estradiol (ESTRACE) 2 MG tablet, Take 0.5 mg by mouth daily. , Disp: , Rfl: 5   folic acid (FOLVITE) 1 MG tablet, Take 1 mg by mouth daily. , Disp: , Rfl:    ibuprofen (ADVIL,MOTRIN) 200 MG tablet, Take 400-600 mg by mouth every 8 (eight) hours as needed (headaches/pain.)., Disp: , Rfl:    Levothyroxine Sodium (TIROSINT) 88 MCG CAPS, Take 1 capsule (88 mcg total) by mouth daily before breakfast., Disp: 90 capsule, Rfl: 4   loratadine (CLARITIN) 10 MG tablet, Take 10 mg by mouth daily., Disp: , Rfl:    methotrexate (RHEUMATREX) 2.5 MG tablet, Take 5 mg by mouth every Sunday. Caution:Chemotherapy. Protect from light. , Disp: , Rfl:    montelukast (SINGULAIR) 10 MG tablet, TAKE 1 TABLET(10 MG) BY MOUTH AT BEDTIME, Disp: 90 tablet, Rfl: 3   naproxen (NAPROSYN) 500  MG tablet, Take 1 tablet (500 mg total) by mouth every 12 (twelve) hours as needed. 1 tablet (500mg ) by mouth every 12 hours as needed, Disp: 20 tablet, Rfl: 5   propranolol ER (INDERAL LA) 60 MG 24 hr capsule, TAKE 1 CAPSULE(60 MG) BY MOUTH DAILY, Disp: 90 capsule, Rfl: 1   SUMAtriptan (IMITREX) 100 MG tablet, TAKE 1 TABLET BY MOUTH AT ONSET OF HEADACHE. MAY REPEAT IN 2 HOURS IF HEADACHE PERSISTS, Disp: 10 tablet, Rfl: 3   Tetrahydrozoline-Zn Sulfate (ALLERGY RELIEF EYE DROPS OP), Place 1-2 drops into both eyes 3 (three) times daily as needed (FOR IRRITATED/ALLERGY EYES)., Disp: , Rfl:    Vitamin D, Ergocalciferol, (DRISDOL) 50000 units CAPS capsule, Take 50,000 Units by mouth every Sunday. , Disp: , Rfl:    Erenumab-aooe (AIMOVIG) 140 MG/ML SOAJ, Inject 140 mg into the skin every 30 (thirty) days., Disp: 1 pen, Rfl: 11  Erenumab-aooe (AIMOVIG) 70 MG/ML SOAJ, Inject 70 mg into the skin every 30 (thirty) days., Disp: 1 pen, Rfl: 3   Family History: family history includes Brain cancer in her mother; Colon cancer in her father; Diabetes in her mother; Heart attack in her mother and sister; Hypertension in her brother and sister; Stroke in her mother; Thyroid disease in her mother.    Social History:   reports that she has never smoked. She has never used smokeless tobacco. She reports that she does not drink alcohol or use drugs.   Review of Systems: Constitutional: Denies weight loss/weight gain  Eyes: No changes in vision. ENT: No oral lesions, sore throat.  GI: see HPI.  Heme/Lymph: No easy bruising.  CV: No chest pain.  GU: No hematuria.  Integumentary: No rashes.  Neuro: No headaches.  Psych: No depression/anxiety.  Endocrine: No heat/cold intolerance.  Allergic/Immunologic: No urticaria.  Resp: No cough, SOB.  Musculoskeletal: No joint swelling.    Physical Examination: BP 133/76 (BP Location: Right Arm, Patient Position: Sitting, Cuff Size: Normal)    Pulse 82    Temp (!)  97.3 F (36.3 C) (Temporal)    Ht 5\' 2"  (1.575 m)    Wt 130 lb (59 kg)    LMP 03/31/2017    BMI 23.78 kg/m  Gen: NAD, alert and oriented x 4 HEENT: PEERLA, EOMI, Neck: supple, no JVD Chest: CTA bilaterally, no wheezes, crackles, or other adventitious sounds CV: RRR, no m/g/c/r Abd: soft, NT, ND, +BS in all four quadrants; no HSM, guarding, ridigity, or rebound tenderness Ext: no edema, well perfused with 2+ pulses, Skin: no rash or lesions noted on observed skin Lymph: no noted LAD  Data Reviewed:   Labs In epic reviewed-thyroid studies normal in Jan 2021   Assessment/Plan: Ms. Klinkner is a 59 y.o. female seen for dysphagia.   1. Dysphagia solids-most likely recurrent esophageal stricture.  Will arrange endoscopy with possible dilation.  Her GERD seems well controlled and should continue diet modifications   2.  Colonoscopy-UTD, last 2019.  Did have family history of colon cancer in her father at age 38.  No lower GI symptoms  Quinnlynn was seen today for follow-up.  Diagnoses and all orders for this visit:  Dysphagia, unspecified type  Patient denies CP, SOB, and use of blood thinners. I discussed the risks and benefits of procedure including bleeding, perforation, infection, missed lesions, medication reactions and possible hospitalization or surgery if complications. All questions answered. No prior issues w/ sedation.   I personally performed the service, non-incident to. (WP)  Laurine Blazer, [facility]

## 2020-03-28 NOTE — Patient Instructions (Signed)
We are scheduling endoscopy - please call w/ any health changes prior

## 2020-03-29 NOTE — Addendum Note (Signed)
Addended by: Rogene Houston on: 03/29/2020 11:42 PM   Modules accepted: Orders

## 2020-04-03 ENCOUNTER — Encounter: Payer: Self-pay | Admitting: Neurology

## 2020-04-03 NOTE — Progress Notes (Addendum)
Submitted BV to [facility] because [facility]. Awaiting return response to see if we can buy and bill or if she needs new spec pharm. PA on file until 08/24/20 but never been ran through [facility] to see if we can buy and bill.   Response received: Buy and bill or specialty pharm. Will try buy and bill this time and see how it is paid. Sent form to scanning- PA already on file.

## 2020-04-10 ENCOUNTER — Other Ambulatory Visit (HOSPITAL_COMMUNITY)
Admission: RE | Admit: 2020-04-10 | Discharge: 2020-04-10 | Disposition: A | Discharge status: Home / Self Care | Payer: BC Managed Care – PPO | Source: Ambulatory Visit | Attending: Internal Medicine | Admitting: Internal Medicine

## 2020-04-10 ENCOUNTER — Other Ambulatory Visit: Payer: Self-pay

## 2020-04-10 DIAGNOSIS — Z01812 Encounter for preprocedural laboratory examination: Secondary | ICD-10-CM | POA: Diagnosis not present

## 2020-04-10 DIAGNOSIS — Z20822 Contact with and (suspected) exposure to covid-19: Secondary | ICD-10-CM | POA: Diagnosis not present

## 2020-04-11 LAB — SARS CORONAVIRUS 2 (TAT 6-24 HRS): SARS Coronavirus 2: NEGATIVE

## 2020-04-12 ENCOUNTER — Other Ambulatory Visit: Payer: Self-pay

## 2020-04-12 ENCOUNTER — Encounter (HOSPITAL_COMMUNITY): Payer: Self-pay | Admitting: Internal Medicine

## 2020-04-12 ENCOUNTER — Ambulatory Visit (HOSPITAL_COMMUNITY)
Admission: RE | Admit: 2020-04-12 | Discharge: 2020-04-12 | Disposition: A | Discharge status: Home / Self Care | Payer: BC Managed Care – PPO | Attending: Internal Medicine | Admitting: Internal Medicine

## 2020-04-12 ENCOUNTER — Encounter (HOSPITAL_COMMUNITY)
Admission: RE | Disposition: A | Discharge status: Home / Self Care | Payer: Self-pay | Source: Home / Self Care | Attending: Internal Medicine

## 2020-04-12 DIAGNOSIS — Z79890 Hormone replacement therapy (postmenopausal): Secondary | ICD-10-CM | POA: Insufficient documentation

## 2020-04-12 DIAGNOSIS — K449 Diaphragmatic hernia without obstruction or gangrene: Secondary | ICD-10-CM | POA: Diagnosis not present

## 2020-04-12 DIAGNOSIS — G43909 Migraine, unspecified, not intractable, without status migrainosus: Secondary | ICD-10-CM | POA: Diagnosis not present

## 2020-04-12 DIAGNOSIS — F329 Major depressive disorder, single episode, unspecified: Secondary | ICD-10-CM | POA: Insufficient documentation

## 2020-04-12 DIAGNOSIS — K200 Eosinophilic esophagitis: Secondary | ICD-10-CM

## 2020-04-12 DIAGNOSIS — Z79899 Other long term (current) drug therapy: Secondary | ICD-10-CM | POA: Insufficient documentation

## 2020-04-12 DIAGNOSIS — Z888 Allergy status to other drugs, medicaments and biological substances status: Secondary | ICD-10-CM | POA: Insufficient documentation

## 2020-04-12 DIAGNOSIS — R1314 Dysphagia, pharyngoesophageal phase: Secondary | ICD-10-CM | POA: Diagnosis present

## 2020-04-12 DIAGNOSIS — K2100 Gastro-esophageal reflux disease with esophagitis, without bleeding: Secondary | ICD-10-CM | POA: Insufficient documentation

## 2020-04-12 DIAGNOSIS — E039 Hypothyroidism, unspecified: Secondary | ICD-10-CM | POA: Insufficient documentation

## 2020-04-12 DIAGNOSIS — Z882 Allergy status to sulfonamides status: Secondary | ICD-10-CM | POA: Diagnosis not present

## 2020-04-12 DIAGNOSIS — Z8719 Personal history of other diseases of the digestive system: Secondary | ICD-10-CM

## 2020-04-12 DIAGNOSIS — Z791 Long term (current) use of non-steroidal anti-inflammatories (NSAID): Secondary | ICD-10-CM | POA: Diagnosis not present

## 2020-04-12 DIAGNOSIS — K222 Esophageal obstruction: Secondary | ICD-10-CM | POA: Insufficient documentation

## 2020-04-12 DIAGNOSIS — Z881 Allergy status to other antibiotic agents status: Secondary | ICD-10-CM | POA: Diagnosis not present

## 2020-04-12 DIAGNOSIS — R1310 Dysphagia, unspecified: Secondary | ICD-10-CM

## 2020-04-12 DIAGNOSIS — Z8349 Family history of other endocrine, nutritional and metabolic diseases: Secondary | ICD-10-CM | POA: Insufficient documentation

## 2020-04-12 DIAGNOSIS — M069 Rheumatoid arthritis, unspecified: Secondary | ICD-10-CM | POA: Diagnosis not present

## 2020-04-12 HISTORY — PX: ESOPHAGEAL DILATION: SHX303

## 2020-04-12 HISTORY — PX: BIOPSY: SHX5522

## 2020-04-12 HISTORY — PX: ESOPHAGOGASTRODUODENOSCOPY: SHX5428

## 2020-04-12 SURGERY — EGD (ESOPHAGOGASTRODUODENOSCOPY)
Anesthesia: Moderate Sedation

## 2020-04-12 MED ORDER — LIDOCAINE VISCOUS HCL 2 % MT SOLN
OROMUCOSAL | Status: AC
  Filled 2020-04-12: qty 15

## 2020-04-12 MED ORDER — MIDAZOLAM HCL 5 MG/5ML IJ SOLN
INTRAMUSCULAR | Status: AC
  Filled 2020-04-12: qty 10

## 2020-04-12 MED ORDER — MEPERIDINE HCL 50 MG/ML IJ SOLN
INTRAMUSCULAR | Status: DC | PRN
  Administered 2020-04-12 (×2): 25 mg via INTRAVENOUS

## 2020-04-12 MED ORDER — SODIUM CHLORIDE 0.9 % IV SOLN
INTRAVENOUS | Status: DC
  Administered 2020-04-12: 1000 mL via INTRAVENOUS

## 2020-04-12 MED ORDER — LIDOCAINE VISCOUS HCL 2 % MT SOLN
OROMUCOSAL | Status: DC | PRN
  Administered 2020-04-12: 1 via OROMUCOSAL

## 2020-04-12 MED ORDER — STERILE WATER FOR IRRIGATION IR SOLN
Status: DC | PRN
  Administered 2020-04-12: 1.5 mL

## 2020-04-12 MED ORDER — MEPERIDINE HCL 50 MG/ML IJ SOLN
INTRAMUSCULAR | Status: AC
  Filled 2020-04-12: qty 1

## 2020-04-12 MED ORDER — FLUTICASONE PROPIONATE HFA 220 MCG/ACT IN AERO: INHALATION_SPRAY | RESPIRATORY_TRACT | 0 refills | Status: DC

## 2020-04-12 MED ORDER — MIDAZOLAM HCL 5 MG/5ML IJ SOLN
INTRAMUSCULAR | Status: DC | PRN
  Administered 2020-04-12 (×2): 2 mg via INTRAVENOUS

## 2020-04-12 NOTE — H&P (Signed)
Laurie Cherry is an 59 y.o. female.   Chief Complaint: Patient is here for esophagogastroduodenoscopy with esophageal dilation. HPI: Patient is 59 year old Caucasian female who has history of GERD as well as eosinophilic esophagitis complicated by distal esophageal stricture which was initially dilated in June 2019 when she was also confirmed to have eosinophilic esophagitis.  She returned 6 months later and stricture was redilated and biopsy was negative for EoE.  She says heartburn is well controlled with dietary measures and cimetidine.  She has difficulty with bread and meats.  She has tick bite allergy and therefore does not eat beef or pork.  She denies any nausea vomiting melena or rectal bleeding. She takes Naprosyn no more than once or twice a week.  Past Medical History:  Diagnosis Date  . Depression   . Esophagitis   . Family history of adverse reaction to anesthesia    Sister  . Fibroids   . GERD (gastroesophageal reflux disease)   . Hypothyroidism   . Rheumatoid arthritis ([place])   . Rheumatoid arthritis ([place]) 01/20/2017  . Worsening headaches     Past Surgical History:  Procedure Laterality Date  . ABDOMINAL HYSTERECTOMY N/A 04/03/2017   Procedure: TOTAL HYSTERECTOMY ABDOMINAL;  Surgeon: Louretta Shorten, MD;  Location: [facility];  Service: Gynecology;  Laterality: N/A;  . BIOPSY  04/10/2018   Procedure: BIOPSY;  Surgeon: Rogene Houston, MD;  Location: [facility];  Service: Endoscopy;;  esophegus   . BIOPSY  10/19/2018   Procedure: BIOPSY;  Surgeon: Rogene Houston, MD;  [facility]: [facility];  Service: Endoscopy;;  esophageal   . COLONOSCOPY    . COLONOSCOPY    . COLONOSCOPY N/A 04/10/2018   Procedure: COLONOSCOPY;  Surgeon: Rogene Houston, MD;  [facility]: [facility];  Service: Endoscopy;  Laterality: N/A;  . ESOPHAGEAL DILATION N/A 04/10/2018   Procedure: ESOPHAGEAL DILATION;  Surgeon: Rogene Houston, MD;  [facility];  Service: Endoscopy;  Laterality:  N/A;  balloon dilation  . ESOPHAGEAL DILATION N/A 10/19/2018   Procedure: ESOPHAGEAL DILATION;  Surgeon: Rogene Houston, MD;  [facility];  Service: Endoscopy;  Laterality: N/A;  . ESOPHAGOGASTRODUODENOSCOPY    . ESOPHAGOGASTRODUODENOSCOPY N/A 04/10/2018   Procedure: ESOPHAGOGASTRODUODENOSCOPY (EGD);  Surgeon: Rogene Houston, MD;  [facility];  Service: Endoscopy;  Laterality: N/A;  1:25  . ESOPHAGOGASTRODUODENOSCOPY N/A 10/19/2018   Procedure: ESOPHAGOGASTRODUODENOSCOPY (EGD);  Surgeon: Rogene Houston, MD;  [facility];  Service: Endoscopy;  Laterality: N/A;  7:30  . EYE SURGERY Right   . SALPINGOOPHORECTOMY Bilateral 04/03/2017   Procedure: SALPINGO OOPHORECTOMY;  Surgeon: Louretta Shorten, MD;  Location: [facility];  Service: Gynecology;  Laterality: Bilateral;    Family History  Problem Relation Age of Onset  . Thyroid disease Mother   . Heart attack Mother   . Stroke Mother   . Diabetes Mother   . Brain cancer Mother   . Colon cancer Father   . Heart attack Sister   . Hypertension Sister   . Hypertension Brother    Social History:  reports that she has never smoked. She has never used smokeless tobacco. She reports that she does not drink alcohol and does not use drugs.  Allergies:  Allergies  Allergen Reactions  . Arava [Leflunomide] Hives  . Azithromycin Other (See Comments)    Severe bloating, abd pain  . Cefuroxime Axetil Other (See Comments)    Increased bloating, abd pain  . Clarithromycin  Other (See Comments)    abd pain  . Crestor [Rosuvastatin]     Per patient she had more joint pain  . Etanercept Hives and Other (See Comments)    Injection site irritation (ENBREL)  . Levofloxacin Hives and Other (See Comments)    Severe dizziness  . Sulfa Antibiotics Hives and Other (See Comments)    abd pain  . Methotrexate Derivatives Other (See Comments)    Injection ONLY (irritation at the site of injection)    Medications Prior to  Admission  Medication Sig Dispense Refill  . Adalimumab (HUMIRA PEN) 40 MG/0.8ML PNKT Inject 40 mg into the skin every 14 (fourteen) days. Every other week on Sunday    . APPLE CIDER VINEGAR PO Take 2 capsules by mouth daily.    . cimetidine (TAGAMET) 400 MG tablet TAKE 1 TABLET(400 MG) BY MOUTH TWICE DAILY (Patient taking differently: Take 400 mg by mouth 2 (two) times daily. ) 60 tablet 5  . citalopram (CELEXA) 20 MG tablet Take 10 mg by mouth at bedtime.  3  . estradiol (ESTRACE) 0.5 MG tablet Take 0.5 mg by mouth daily.    . folic acid (FOLVITE) 1 MG tablet Take 1 mg by mouth daily.     Marland Kitchen ibuprofen (ADVIL,MOTRIN) 200 MG tablet Take 400-600 mg by mouth every 8 (eight) hours as needed (headaches/pain.).    Marland Kitchen Levothyroxine Sodium (TIROSINT) 88 MCG CAPS Take 1 capsule (88 mcg total) by mouth daily before breakfast. 90 capsule 4  . loratadine (CLARITIN) 10 MG tablet Take 10 mg by mouth at bedtime.     . methotrexate (RHEUMATREX) 2.5 MG tablet Take 10 mg by mouth once a week. Caution:Chemotherapy. Protect from light.     . montelukast (SINGULAIR) 10 MG tablet TAKE 1 TABLET(10 MG) BY MOUTH AT BEDTIME (Patient taking differently: Take 10 mg by mouth at bedtime. ) 90 tablet 3  . Multiple Vitamins-Minerals (IMMUNE SUPPORT PO) Take 1 Scoop by mouth daily.    . naproxen (NAPROSYN) 500 MG tablet Take 1 tablet (500 mg total) by mouth every 12 (twelve) hours as needed. 1 tablet (500mg ) by mouth every 12 hours as needed (Patient taking differently: Take 500 mg by mouth every 12 (twelve) hours as needed for mild pain. ) 20 tablet 5  . propranolol ER (INDERAL LA) 60 MG 24 hr capsule TAKE 1 CAPSULE(60 MG) BY MOUTH DAILY (Patient taking differently: Take 60 mg by mouth daily. ) 90 capsule 1  . SUMAtriptan (IMITREX) 100 MG tablet TAKE 1 TABLET BY MOUTH AT ONSET OF HEADACHE. MAY REPEAT IN 2 HOURS IF HEADACHE PERSISTS (Patient taking differently: Take 100 mg by mouth every 2 (two) hours as needed for migraine. ) 10  tablet 3  . Tetrahydrozoline-Zn Sulfate (ALLERGY RELIEF EYE DROPS OP) Place 1-2 drops into both eyes 3 (three) times daily as needed (FOR IRRITATED/ALLERGY EYES).    . Vitamin D, Ergocalciferol, (DRISDOL) 50000 units CAPS capsule Take 50,000 Units by mouth every Sunday.     Marland Kitchen BOTOX 100 units SOLR injection Inject 100 Units as directed as directed. Every 12 weeks      No results found for this or any previous visit (from the past 48 hour(s)). No results found.  Review of Systems  Blood pressure (!) 153/82, pulse 74, temperature 98 F (36.7 C), temperature source Oral, resp. rate 13, height 5\' 2"  (1.575 m), weight 59 kg, last menstrual period 03/31/2017, SpO2 100 %. Physical Exam  HENT:  Mouth/Throat: Mucous membranes are moist.  Oropharynx is clear.  Eyes: Conjunctivae are normal. Right eye exhibits no discharge.  Cardiovascular: Normal rate, regular rhythm and normal heart sounds.  No murmur heard. Respiratory: Effort normal and breath sounds normal.  GI:  Abdomen is symmetrical.  She has Pfannenstiel scar.  Abdomen is soft and nontender with organomegaly or masses.  Musculoskeletal:        General: No swelling.     Cervical back: Normal range of motion.  Lymphadenopathy:    She has no cervical adenopathy.  Neurological: She is alert.  Skin: Skin is warm and dry.     Assessment/Plan Esophageal dysphagia. History of GERD and eosinophilic esophagitis and distal esophageal stricture Echo gastroduodenoscopy with esophageal dilation and esophageal biopsy  Hildred Laser, MD 04/12/2020, 2:33 PM

## 2020-04-12 NOTE — Op Note (Signed)
[facility] Patient Name: Laurie Cherry Procedure Date: 04/12/2020 2:05 PM MRN: [id number] Date of Birth: Jun 09, 1961 Attending MD: Hildred Laser , MD CSN: [id number] Age: 59 Admit Type: Outpatient Procedure:                Upper GI endoscopy Indications:              Esophageal dysphagia, Evaluation of eosinophilic                            esophagitis, For therapy of eosinophilic esophagitis Providers:                Hildred Laser, MD, Charlsie Quest. Theda Sers RN, RN, Aram Candela Referring MD:             Laray Anger, NP Medicines:                Lidocaine spray, Meperidine 50 mg IV, Midazolam 4                            mg IV Complications:            No immediate complications. Estimated Blood Loss:     Estimated blood loss was minimal. Procedure:                Pre-Anesthesia Assessment:                           - Prior to the procedure, a History and Physical                            was performed, and patient medications and                            allergies were reviewed. The patient's tolerance of                            previous anesthesia was also reviewed. The risks                            and benefits of the procedure and the sedation                            options and risks were discussed with the patient.                            All questions were answered, and informed consent                            was obtained. Prior Anticoagulants: The patient has                            taken no previous anticoagulant or antiplatelet  agents except for NSAID medication. ASA Grade                            Assessment: II - A patient with mild systemic                            disease. After reviewing the risks and benefits,                            the patient was deemed in satisfactory condition to                            undergo the procedure.                           After obtaining  informed consent, the endoscope was                            passed under direct vision. Throughout the                            procedure, the patient's blood pressure, pulse, and                            oxygen saturations were monitored continuously. The                            [id number]-[id number] ([id number]) scope was introduced through the                            mouth, and advanced to the second part of duodenum.                            The upper GI endoscopy was accomplished without                            difficulty. The patient tolerated the procedure                            well. Scope In: 2:44:09 PM Scope Out: 2:57:57 PM Total Procedure Duration: 0 hours 13 minutes 48 seconds  Findings:      The hypopharynx was normal.      The proximal esophagus was normal.      Mucosal changes including ringed esophagus, longitudinal furrows and       stenosis( at [facility]) were found in the mid esophagus and in the distal       esophagus. Biopsies were taken with a cold forceps for histology.      One benign-appearing, intrinsic mild stenosis was found 34 cm from the       incisors. The stenosis was traversed. A TTS dilator was passed through       the scope. Dilation with a 15-16.5-18 mm balloon dilator was performed       to 15 mm, 16.5 mm and 18 mm. The dilation site was examined and showed  mild mucosal disruption, moderate improvement in luminal narrowing and       no perforation.      A 2 cm hiatal hernia was present.      The entire examined stomach was normal.      The duodenal bulb and second portion of the duodenum were normal. Impression:               - Normal hypopharynx.                           - Normal proximal esophagus.                           - Esophageal mucosal changes secondary to                            eosinophilic esophagitis. Biopsied.                           - Benign-appearing esophageal stenosis. Dilated.                           - 2 cm hiatal  hernia.                           - Normal stomach.                           - Normal duodenal bulb and second portion of the                            duodenum. Moderate Sedation:      Moderate (conscious) sedation was administered by the endoscopy nurse       and supervised by the endoscopist. The following parameters were       monitored: oxygen saturation, heart rate, blood pressure, CO2       capnography and response to care. Total physician intraservice time was       18 minutes. Recommendation:           - Patient has a contact number available for                            emergencies. The signs and symptoms of potential                            delayed complications were discussed with the                            patient. Return to normal activities tomorrow.                            Written discharge instructions were provided to the                            patient.                           - Resume previous  diet today.                           - Continue present medications.                           - fluticasone 880 mcg po bid for 6 weeks.                           - No aspirin, ibuprofen, naproxen, or other                            non-steroidal anti-inflammatory drugs for 2 days.                           - Await pathology results.                           - Repeat upper endoscopy PRN. Procedure Code(s):        --- Professional ---                           [id number], Esophagogastroduodenoscopy, flexible,                            transoral; with transendoscopic balloon dilation of                            esophagus (less than 30 mm diameter)                           G0500, Moderate sedation services provided by the                            same physician or other qualified health care                            professional performing a gastrointestinal                            endoscopic service that sedation supports,                             requiring the presence of an independent trained                            observer to assist in the monitoring of the                            patient's level of consciousness and physiological                            status; initial 15 minutes of intra-service time;                            patient age 54 years or  older (additional time may                            be reported with [phone], as appropriate) Diagnosis Code(s):        --- Professional ---                           L54.4, Eosinophilic esophagitis                           K22.2, Esophageal obstruction                           K44.9, Diaphragmatic hernia without obstruction or                            gangrene                           R13.14, Dysphagia, pharyngoesophageal phase CPT copyright 2019 American Medical Association. All rights reserved. The codes documented in this report are preliminary and upon coder review may  be revised to meet current compliance requirements. Hildred Laser, MD Hildred Laser, MD 04/12/2020 3:15:09 PM This report has been signed electronically. Number of Addenda: 0

## 2020-04-12 NOTE — Discharge Instructions (Signed)
No aspirin or NSAIDs for 48 hours. Resume scheduled medications as before. Fluticasone 880 mcg twice daily for total of 6 weeks. Use as directed. Resume usual diet. No driving for 24 hours. Physician will call with biopsy results.       Upper Endoscopy, Adult, Care After This sheet gives you information about how to care for yourself after your procedure. Your health care provider may also give you more specific instructions. If you have problems or questions, contact your health care provider. What can I expect after the procedure? After the procedure, it is common to have:  A sore throat.  Mild stomach pain or discomfort.  Bloating.  Nausea. Follow these instructions at home:   Follow instructions from your health care provider about what to eat or drink after your procedure.  Return to your normal activities as told by your health care provider. Ask your health care provider what activities are safe for you.  Take over-the-counter and prescription medicines only as told by your health care provider.  Do not drive for 24 hours if you were given a sedative during your procedure.  Keep all follow-up visits as told by your health care provider. This is important. Contact a health care provider if you have:  A sore throat that lasts longer than one day.  Trouble swallowing. Get help right away if:  You vomit blood or your vomit looks like coffee grounds.  You have: ? A fever. ? Bloody, black, or tarry stools. ? A severe sore throat or you cannot swallow. ? Difficulty breathing. ? Severe pain in your chest or abdomen. Summary  After the procedure, it is common to have a sore throat, mild stomach discomfort, bloating, and nausea.  Do not drive for 24 hours if you were given a sedative during the procedure.  Follow instructions from your health care provider about what to eat or drink after your procedure.  Return to your normal activities as told by your health  care provider. This information is not intended to replace advice given to you by your health care provider. Make sure you discuss any questions you have with your health care provider. Document Revised: 04/07/2018 Document Reviewed: 03/16/2018 Elsevier Patient Education  [employer].

## 2020-04-14 ENCOUNTER — Ambulatory Visit (INDEPENDENT_AMBULATORY_CARE_PROVIDER_SITE_OTHER): Payer: BC Managed Care – PPO | Admitting: Neurology

## 2020-04-14 ENCOUNTER — Encounter (HOSPITAL_COMMUNITY): Payer: Self-pay | Admitting: Internal Medicine

## 2020-04-14 ENCOUNTER — Other Ambulatory Visit: Payer: Self-pay

## 2020-04-14 DIAGNOSIS — G43709 Chronic migraine without aura, not intractable, without status migrainosus: Secondary | ICD-10-CM | POA: Diagnosis not present

## 2020-04-14 LAB — SURGICAL PATHOLOGY

## 2020-04-14 MED ORDER — ONABOTULINUMTOXINA 100 UNITS IJ SOLR
165.0000 [IU] | Freq: Once | INTRAMUSCULAR | Status: AC
  Administered 2020-04-14: 155 [IU] via INTRAMUSCULAR

## 2020-04-14 NOTE — Progress Notes (Signed)
[facility]   Procedure Note Botox  Attending: Dr. Alessandro Griep  Preoperative Diagnosis(es): Chronic migraine  Consent obtained from: The patient Benefits discussed included, but were not limited to decreased muscle tightness, increased joint range of motion, and decreased pain.  Risk discussed included, but were not limited pain and discomfort, bleeding, bruising, excessive weakness, venous thrombosis, muscle atrophy and dysphagia.  Anticipated outcomes of the procedure as well as he risks and benefits of the alternatives to the procedure, and the roles and tasks of the personnel to be involved, were discussed with the patient, and the patient consents to the procedure and agrees to proceed. A copy of the patient medication guide was given to the patient which explains the blackbox warning.  Patients identity and treatment sites confirmed Yes.  .  Details of Procedure: Skin was cleaned with alcohol. Prior to injection, the needle plunger was aspirated to make sure the needle was not within a blood vessel.  There was no blood retrieved on aspiration.    Following is a summary of the muscles injected  And the amount of Botulinum toxin used:  Dilution 200 units of Botox was reconstituted with 4 ml of preservative free normal saline. Time of reconstitution: At the time of the office visit (<30 minutes prior to injection)   Injections  155 total units of Botox was injected with a 30 gauge needle.  Injection Sites: L occipitalis: 15 units- 3 sites  R occiptalis: 15 units- 3 sites  L upper trapezius: 15 units- 3 sites R upper trapezius: 15 units- 3 sits          L paraspinal: 10 units- 2 sites R paraspinal: 10 units- 2 sites  Face L frontalis(2 injection sites):10 units   R frontalis(2 injection sites):10 units         L corrugator: 5 units   R corrugator: 5 units           Procerus: 5 units   L temporalis: 20 units R temporalis: 20 units   Agent:  200 units of botulinum Type  A (Onobotulinum Toxin type A) was reconstituted with 4 ml of preservative free normal saline.  Time of reconstitution: At the time of the office visit (<30 minutes prior to injection)     Total injected (Units):  155  Total wasted (Units):  10  Patient tolerated procedure well without complications.   Reinjection is anticipated in 3 months.   

## 2020-06-27 NOTE — Progress Notes (Signed)
NEUROLOGY FOLLOW UP OFFICE NOTE  Laurie Cherry [id number]  HISTORY OF PRESENT ILLNESS: Laurie Cherry is a 59year old right-handed female with rheumatoid arthritis, hypothyroidism, reflux and mild depression who follows up for migraines.  UPDATE: Overall doing well. Intensity:  moderate Duration:  1 hour Frequency:  2 in August. For the last couple of days, she has had left sided scalp tenderness and maxillary pressure. Current NSAIDS:naproxen 500mg  Current analgesics:Excedrin Migraine Current triptans:Sumatriptan 50mg  (100mg  caused drowsiness) w/wo naproxen 500mg  Current ergotamine:none Current anti-emetic:none Current muscle relaxants:none Current anti-anxiolytic:none Current sleep aide:none Current Antihypertensive medications:Propranolol ER 60mg  Current Antidepressant medications:Celexa Current Anticonvulsant medications:none Current anti-CGRP:none Current Vitamins/Herbal/Supplements:D Current Antihistamines/Decongestants:Claritin Other therapy:Botox Hormone/birth control: estradiol  Caffeine:1 cup of coffee daily Alcohol:no Smoker:no Diet:1/2 can of soda daily. Drinks water Exercise:no Depression:stable; Anxiety:no Other pain:no Sleep hygiene:varies  HISTORY: She has a history of "hormonal" migraines most of her life, described as pounding right temporal pain and often associated with her menstrual cycle.They have pretty much stabilized but she will still get it every now and then.  She was diagnosed with rheumatoid arthritis in 2014.She had been on methotrexate for about 2 years (stopped a month ago).In September 2015, she began having new headaches.It coincided with change in her thyroid medications.They are left sided, 7/10 non-throbbing pain. They are associated with photophobia, phonophobia, and osmophobia but not nausea.  Initially, they typically lasted3 to 4 days and occur 1 to 2  times a week (however she also has a dull daily headache) Stress is a trigger.Change in thyroid medication may have been a trigger. These headaches do not correlate with her menstrual cycle. Sumatriptan with naproxen helps relieve it.  Past NSAIDS: no Past analgesics: Excedrin, Tylenol (ineffective) Past abortive triptans: no Past muscle relaxants: no Past anti-emetic: no Past antihypertensive medications: atenolol (25mg  ineffective and higher doses caused dizziness) Past antidepressant medications: venlafaxine XR 150mg  Past anticonvulsant medications: topiramate 25mg  (caused drowsiness, but willing to retry it if needed) Past anti-CGRP: Aimovig 70mg  (she was on it for 2 months.  Ineffective) Past vitamins/Herbal/Supplements: no Past antihistamines/decongestants: no Other past therapies: no  Family history of headache:Mom, sister Mother had glioblastoma PAST MEDICAL HISTORY: Past Medical History:  Diagnosis Date  . Depression   . Esophagitis   . Family history of adverse reaction to anesthesia    Sister  . Fibroids   . GERD (gastroesophageal reflux disease)   . Hypothyroidism   . Rheumatoid arthritis ([place])   . Rheumatoid arthritis ([place]) 01/20/2017  . Worsening headaches     MEDICATIONS: Current Outpatient Medications on File Prior to Visit  Medication Sig Dispense Refill  . Adalimumab (HUMIRA PEN) 40 MG/0.8ML PNKT Inject 40 mg into the skin every 14 (fourteen) days. Every other week on Sunday    . APPLE CIDER VINEGAR PO Take 2 capsules by mouth daily.    Marland Kitchen BOTOX 100 units SOLR injection Inject 100 Units as directed as directed. Every 12 weeks    . cimetidine (TAGAMET) 400 MG tablet TAKE 1 TABLET(400 MG) BY MOUTH TWICE DAILY (Patient taking differently: Take 400 mg by mouth 2 (two) times daily. ) 60 tablet 5  . citalopram (CELEXA) 20 MG tablet Take 10 mg by mouth at bedtime.  3  . estradiol (ESTRACE) 0.5 MG tablet Take 0.5 mg by mouth daily.    . fluticasone  (FLOVENT HFA) 220 MCG/ACT inhaler 4 puffs twice daily as directed. Remember not to eat or drink anything for 3 h after each dose. 4 Inhaler 0  .  folic acid (FOLVITE) 1 MG tablet Take 1 mg by mouth daily.     Marland Kitchen ibuprofen (ADVIL,MOTRIN) 200 MG tablet Take 400-600 mg by mouth every 8 (eight) hours as needed (headaches/pain.).    Marland Kitchen Levothyroxine Sodium (TIROSINT) 88 MCG CAPS Take 1 capsule (88 mcg total) by mouth daily before breakfast. 90 capsule 4  . loratadine (CLARITIN) 10 MG tablet Take 10 mg by mouth at bedtime.     . methotrexate (RHEUMATREX) 2.5 MG tablet Take 10 mg by mouth once a week. Caution:Chemotherapy. Protect from light.     . montelukast (SINGULAIR) 10 MG tablet TAKE 1 TABLET(10 MG) BY MOUTH AT BEDTIME (Patient taking differently: Take 10 mg by mouth at bedtime. ) 90 tablet 3  . Multiple Vitamins-Minerals (IMMUNE SUPPORT PO) Take 1 Scoop by mouth daily.    . naproxen (NAPROSYN) 500 MG tablet Take 1 tablet (500 mg total) by mouth every 12 (twelve) hours as needed. 1 tablet (500mg ) by mouth every 12 hours as needed (Patient taking differently: Take 500 mg by mouth every 12 (twelve) hours as needed for mild pain. ) 20 tablet 5  . propranolol ER (INDERAL LA) 60 MG 24 hr capsule TAKE 1 CAPSULE(60 MG) BY MOUTH DAILY (Patient taking differently: Take 60 mg by mouth daily. ) 90 capsule 1  . SUMAtriptan (IMITREX) 100 MG tablet TAKE 1 TABLET BY MOUTH AT ONSET OF HEADACHE. MAY REPEAT IN 2 HOURS IF HEADACHE PERSISTS (Patient taking differently: Take 100 mg by mouth every 2 (two) hours as needed for migraine. ) 10 tablet 3  . Tetrahydrozoline-Zn Sulfate (ALLERGY RELIEF EYE DROPS OP) Place 1-2 drops into both eyes 3 (three) times daily as needed (FOR IRRITATED/ALLERGY EYES).    . Vitamin D, Ergocalciferol, (DRISDOL) 50000 units CAPS capsule Take 50,000 Units by mouth every Sunday.      No current facility-administered medications on file prior to visit.    ALLERGIES: Allergies  Allergen Reactions    . Arava [Leflunomide] Hives  . Azithromycin Other (See Comments)    Severe bloating, abd pain  . Cefuroxime Axetil Other (See Comments)    Increased bloating, abd pain  . Clarithromycin Other (See Comments)    abd pain  . Crestor [Rosuvastatin]     Per patient she had more joint pain  . Etanercept Hives and Other (See Comments)    Injection site irritation (ENBREL)  . Levofloxacin Hives and Other (See Comments)    Severe dizziness  . Sulfa Antibiotics Hives and Other (See Comments)    abd pain  . Methotrexate Derivatives Other (See Comments)    Injection ONLY (irritation at the site of injection)    FAMILY HISTORY: Family History  Problem Relation Age of Onset  . Thyroid disease Mother   . Heart attack Mother   . Stroke Mother   . Diabetes Mother   . Brain cancer Mother   . Colon cancer Father   . Heart attack Sister   . Hypertension Sister   . Hypertension Brother    SOCIAL HISTORY: Social History   Socioeconomic History  . Marital status: Married    Spouse name: Not on file  . Number of children: 1  . Years of education: Not on file  . Highest education level: Master's degree (e.g., MA, MS, MEng, MEd, MSW, MBA)  Occupational History  . Not on file  Tobacco Use  . Smoking status: Never Smoker  . Smokeless tobacco: Never Used  Vaping Use  . Vaping Use: Never used  Substance and Sexual Activity  . Alcohol use: No    Alcohol/week: 0.0 standard drinks  . Drug use: No  . Sexual activity: Not on file  Other Topics Concern  . Not on file  Social History Narrative   Pt is married lives with spouse in 2 story home she has 1 children, Master Degree in Education   She is right handed, and drinks coffee once a day, 1 soda a day and not tea   She does not work out at all   Social Determinants of Radio broadcast assistant Strain:   . Difficulty of Paying Living Expenses: Not on file  Food Insecurity:   . Worried About Charity fundraiser in the Last Year: Not  on file  . Ran Out of Food in the Last Year: Not on file  Transportation Needs:   . Lack of Transportation (Medical): Not on file  . Lack of Transportation (Non-Medical): Not on file  Physical Activity:   . Days of Exercise per Week: Not on file  . Minutes of Exercise per Session: Not on file  Stress:   . Feeling of Stress : Not on file  Social Connections:   . Frequency of Communication with Friends and Family: Not on file  . Frequency of Social Gatherings with Friends and Family: Not on file  . Attends Religious Services: Not on file  . Active Member of Clubs or Organizations: Not on file  . Attends Archivist Meetings: Not on file  . Marital Status: Not on file  Intimate Partner Violence:   . Fear of Current or Ex-Partner: Not on file  . Emotionally Abused: Not on file  . Physically Abused: Not on file  . Sexually Abused: Not on file    PHYSICAL EXAM: Blood pressure (!) 146/76, pulse 82, weight 134 lb (60.8 kg), last menstrual period 03/31/2017, SpO2 98 %. General: No acute distress.  Patient appears well-groomed.   Head:  Normocephalic/atraumatic Eyes:  Fundi examined but not visualized Neck: supple, no paraspinal tenderness, full range of motion Heart:  Regular rate and rhythm Lungs:  Clear to auscultation bilaterally Back: No paraspinal tenderness Neurological Exam: alert and oriented to person, place, and time. Attention span and concentration intact, recent and remote memory intact, fund of knowledge intact.  Speech fluent and not dysarthric, language intact.  CN II-XII intact. Bulk and tone normal, muscle strength 5/5 throughout.  Sensation to light touch, temperature and vibration intact.  Deep tendon reflexes 2+ throughout, toes downgoing.  Finger to nose and heel to shin testing intact.  Gait normal, Romberg negative.  IMPRESSION: Chronic migraine without aura, without status migrainosus, not intractable  PLAN: 1.  For preventative management, Botox every  3 months and propranolol ER 60mg  daily 2.  For abortive therapy, sumatriptan 3.  Limit use of pain relievers to no more than 2 days out of week to prevent risk of rebound or medication-overuse headache. 4.  Keep headache diary 5.  Exercise, hydration, caffeine cessation, sleep hygiene, monitor for and avoid triggers 6.  Follow up for Botox    Metta Clines, DO

## 2020-06-29 ENCOUNTER — Other Ambulatory Visit: Payer: Self-pay

## 2020-06-29 ENCOUNTER — Ambulatory Visit: Payer: BC Managed Care – PPO | Admitting: Neurology

## 2020-06-29 ENCOUNTER — Encounter: Payer: Self-pay | Admitting: Neurology

## 2020-06-29 VITALS — BP 146/76 | HR 82 | Wt 134.0 lb

## 2020-06-29 DIAGNOSIS — G43709 Chronic migraine without aura, not intractable, without status migrainosus: Secondary | ICD-10-CM | POA: Diagnosis not present

## 2020-06-29 NOTE — Patient Instructions (Signed)
  1. Continue propranolol ER 60mg  daily and Botox every 3 months 2. Take sumatriptan at earliest onset of headache.  May repeat dose once in 2 hours if needed.  Maximum 2 tablets in 24 hours. 3. Limit use of pain relievers to no more than 2 days out of the week.  These medications include acetaminophen, NSAIDs (ibuprofen/Advil/Motrin, naproxen/Aleve, triptans (Imitrex/sumatriptan), Excedrin, and narcotics.  This will help reduce risk of rebound headaches. 4. Be aware of common food triggers:  - Caffeine:  coffee, black tea, cola, Mt. Dew  - Chocolate  - Dairy:  aged cheeses (brie, blue, cheddar, gouda, [place], provolone, [place], Swiss, etc), chocolate milk, buttermilk, sour cream, limit eggs and yogurt  - Nuts, peanut butter  - Alcohol  - Cereals/grains:  FRESH breads (fresh bagels, sourdough, doughnuts), yeast productions  - Processed/canned/aged/cured meats (pre-packaged deli meats, hotdogs)  - MSG/glutamate:  soy sauce, flavor enhancer, pickled/preserved/marinated foods  - Sweeteners:  aspartame (Equal, Nutrasweet).  Sugar and Splenda are okay  - Vegetables:  legumes (lima beans, lentils, snow peas, fava beans, pinto peans, peas, garbanzo beans), sauerkraut, onions, olives, pickles  - Fruit:  avocados, bananas, citrus fruit (orange, lemon, grapefruit), mango  - Other:  Frozen meals, macaroni and cheese 5. Routine exercise 6. Stay adequately hydrated (aim for 64 oz water daily) 7. Keep headache diary 8. Maintain proper stress management 9. Maintain proper sleep hygiene 10. Do not skip meals 11. Consider supplements:  magnesium citrate 400mg  daily, riboflavin 400mg  daily, coenzyme Q10 100mg  three times daily.

## 2020-07-01 ENCOUNTER — Other Ambulatory Visit (INDEPENDENT_AMBULATORY_CARE_PROVIDER_SITE_OTHER): Payer: Self-pay | Admitting: Internal Medicine

## 2020-07-10 ENCOUNTER — Ambulatory Visit: Payer: BC Managed Care – PPO | Admitting: Neurology

## 2020-07-14 ENCOUNTER — Other Ambulatory Visit: Payer: Self-pay

## 2020-07-14 ENCOUNTER — Ambulatory Visit (INDEPENDENT_AMBULATORY_CARE_PROVIDER_SITE_OTHER): Payer: BC Managed Care – PPO | Admitting: Neurology

## 2020-07-14 DIAGNOSIS — G43709 Chronic migraine without aura, not intractable, without status migrainosus: Secondary | ICD-10-CM

## 2020-07-14 MED ORDER — ONABOTULINUMTOXINA 100 UNITS IJ SOLR
170.0000 [IU] | Freq: Once | INTRAMUSCULAR | Status: AC
  Administered 2020-07-14: 170 [IU] via INTRAMUSCULAR

## 2020-07-14 NOTE — Progress Notes (Signed)
[facility]   Procedure Note Botox  Attending: Dr. Metta Clines  Preoperative Diagnosis(es): Chronic migraine  Consent obtained from: The patient Benefits discussed included, but were not limited to decreased muscle tightness, increased joint range of motion, and decreased pain.  Risk discussed included, but were not limited pain and discomfort, bleeding, bruising, excessive weakness, venous thrombosis, muscle atrophy and dysphagia.  Anticipated outcomes of the procedure as well as he risks and benefits of the alternatives to the procedure, and the roles and tasks of the personnel to be involved, were discussed with the patient, and the patient consents to the procedure and agrees to proceed. A copy of the patient medication guide was given to the patient which explains the blackbox warning.  Patients identity and treatment sites confirmed Yes.  .  Details of Procedure: Skin was cleaned with alcohol. Prior to injection, the needle plunger was aspirated to make sure the needle was not within a blood vessel.  There was no blood retrieved on aspiration.    Following is a summary of the muscles injected  And the amount of Botulinum toxin used:  Dilution 200 units of Botox was reconstituted with 4 ml of preservative free normal saline. Time of reconstitution: At the time of the office visit (<30 minutes prior to injection)   Injections  155 total units of Botox was injected with a 30 gauge needle.  Injection Sites: L occipitalis: 15 units- 3 sites  R occiptalis: 15 units- 3 sites  L upper trapezius: 15 units- 3 sites R upper trapezius: 15 units- 3 sits          L paraspinal: 10 units- 2 sites R paraspinal: 10 units- 2 sites  Face L frontalis(2 injection sites):10 units   R frontalis(2 injection sites):10 units         L corrugator: 5 units   R corrugator: 5 units           Procerus: 5 units   L temporalis: 20 units R temporalis: 20 units   Agent:  200 units of botulinum Type  A (Onobotulinum Toxin type A) was reconstituted with 4 ml of preservative free normal saline.  Time of reconstitution: At the time of the office visit (<30 minutes prior to injection)     Total injected (Units): 155  Total wasted (Units): 20  Patient tolerated procedure well without complications.   Reinjection is anticipated in 3 months.

## 2020-07-14 NOTE — Addendum Note (Signed)
Addended by: Venetia Night on: 07/14/2020 04:06 PM   Modules accepted: Orders

## 2020-07-24 ENCOUNTER — Other Ambulatory Visit: Payer: Self-pay | Admitting: Neurology

## 2020-07-24 DIAGNOSIS — G43009 Migraine without aura, not intractable, without status migrainosus: Secondary | ICD-10-CM

## 2020-09-12 ENCOUNTER — Encounter: Payer: Self-pay | Admitting: Neurology

## 2020-09-12 NOTE — Progress Notes (Addendum)
Per BV; SP is [facility].  11/16- called to set up account and giver verbal script on Botox    Laurie Cherry [place]: [id number] - PA Case ID: [id number] Need help? Call us at [phone] Outcome Approvedtoday Your PA request has been approved. Additional information will be provided in the approval communication. (Message 1145) Drug Botox 200UNIT solution Form [employer] Electronic PA Form [id number] NCPDP) Approval valid from 09/19/20 to 09/19/21

## 2020-09-14 ENCOUNTER — Other Ambulatory Visit (INDEPENDENT_AMBULATORY_CARE_PROVIDER_SITE_OTHER): Payer: Self-pay | Admitting: Gastroenterology

## 2020-09-14 NOTE — Telephone Encounter (Signed)
Last seen 03/28/2020 dysphagia by Thayer Headings.

## 2020-10-05 ENCOUNTER — Other Ambulatory Visit: Payer: Self-pay | Admitting: Neurology

## 2020-10-05 DIAGNOSIS — G43009 Migraine without aura, not intractable, without status migrainosus: Secondary | ICD-10-CM

## 2020-10-05 DIAGNOSIS — G43709 Chronic migraine without aura, not intractable, without status migrainosus: Secondary | ICD-10-CM

## 2020-10-13 ENCOUNTER — Ambulatory Visit (INDEPENDENT_AMBULATORY_CARE_PROVIDER_SITE_OTHER): Payer: BC Managed Care – PPO | Admitting: Neurology

## 2020-10-13 ENCOUNTER — Other Ambulatory Visit: Payer: Self-pay

## 2020-10-13 DIAGNOSIS — G43709 Chronic migraine without aura, not intractable, without status migrainosus: Secondary | ICD-10-CM

## 2020-10-13 MED ORDER — ONABOTULINUMTOXINA 100 UNITS IJ SOLR
200.0000 [IU] | Freq: Once | INTRAMUSCULAR | Status: AC
  Administered 2020-10-13: 200 [IU] via INTRAMUSCULAR

## 2020-10-13 NOTE — Progress Notes (Signed)
[facility]  ° °Procedure Note Botox ° °Attending: Dr. Whitman Meinhardt ° °Preoperative Diagnosis(es): Chronic migraine ° °Consent obtained from: The patient °Benefits discussed included, but were not limited to decreased muscle tightness, increased joint range of motion, and decreased pain.  Risk discussed included, but were not limited pain and discomfort, bleeding, bruising, excessive weakness, venous thrombosis, muscle atrophy and dysphagia.  Anticipated outcomes of the procedure as well as he risks and benefits of the alternatives to the procedure, and the roles and tasks of the personnel to be involved, were discussed with the patient, and the patient consents to the procedure and agrees to proceed. A copy of the patient medication guide was given to the patient which explains the blackbox warning. ° °Patients identity and treatment sites confirmed Yes.  . ° °Details of Procedure: °Skin was cleaned with alcohol. Prior to injection, the needle plunger was aspirated to make sure the needle was not within a blood vessel.  There was no blood retrieved on aspiration.   ° °Following is a summary of the muscles injected  And the amount of Botulinum toxin used: ° °Dilution °200 units of Botox was reconstituted with 4 ml of preservative free normal saline. °Time of reconstitution: At the time of the office visit (<30 minutes prior to injection)  ° °Injections  °155 total units of Botox was injected with a 30 gauge needle. ° °Injection Sites: °L occipitalis: 15 units- 3 sites  °R occiptalis: 15 units- 3 sites ° °L upper trapezius: 15 units- 3 sites °R upper trapezius: 15 units- 3 sits          °L paraspinal: 10 units- 2 sites °R paraspinal: 10 units- 2 sites ° °Face °L frontalis(2 injection sites):10 units   °R frontalis(2 injection sites):10 units         °L corrugator: 5 units   °R corrugator: 5 units           °Procerus: 5 units   °L temporalis: 20 units °R temporalis: 20 units  ° °Agent:  °200 units of botulinum Type  A (Onobotulinum Toxin type A) was reconstituted with 4 ml of preservative free normal saline.  °Time of reconstitution: At the time of the office visit (<30 minutes prior to injection)  ° ° ° Total injected (Units):  155 ° Total wasted (Units):  45 ° °Patient tolerated procedure well without complications.   °Reinjection is anticipated in 3 months. ° ° °

## 2020-10-24 ENCOUNTER — Encounter (INDEPENDENT_AMBULATORY_CARE_PROVIDER_SITE_OTHER): Payer: Self-pay | Admitting: Internal Medicine

## 2020-10-24 ENCOUNTER — Other Ambulatory Visit: Payer: Self-pay

## 2020-10-24 ENCOUNTER — Ambulatory Visit (INDEPENDENT_AMBULATORY_CARE_PROVIDER_SITE_OTHER): Payer: BC Managed Care – PPO | Admitting: Internal Medicine

## 2020-10-24 DIAGNOSIS — K219 Gastro-esophageal reflux disease without esophagitis: Secondary | ICD-10-CM | POA: Insufficient documentation

## 2020-10-24 DIAGNOSIS — K200 Eosinophilic esophagitis: Secondary | ICD-10-CM | POA: Insufficient documentation

## 2020-10-24 DIAGNOSIS — K2100 Gastro-esophageal reflux disease with esophagitis, without bleeding: Secondary | ICD-10-CM | POA: Diagnosis not present

## 2020-10-24 MED ORDER — LANSOPRAZOLE 30 MG PO CPDR
30.0000 mg | DELAYED_RELEASE_CAPSULE | Freq: Every day | ORAL | 5 refills | Status: DC
Start: 2020-10-24 — End: 2021-01-12

## 2020-10-24 MED ORDER — FAMOTIDINE 20 MG PO TABS: 20.0000 mg | ORAL_TABLET | Freq: Every day | ORAL | Status: DC

## 2020-10-24 NOTE — Progress Notes (Signed)
Presenting complaint;  Follow-up for GERD and eosinophilic esophagitis.  Database and subjective:  Patient is 59 year old Caucasian female who has history of erosive reflux esophagitis as well as EoE who is here for scheduled visit. She has history of eosinophilic esophagitis dating back to 2009. Since Dr. Virgilio Belling has retired she has been receiving GI care here. She underwent EGD with esophageal dilation with balloon in June 2019, December 2019 and more recently in June 2021. EOE was confirmed on 1st and 3rd exam but biopsy from her esophagus on December 2019 exam showed her to be in remission. She was retreated back in June this year. She developed headache with Nexium and Dexilant and therefore has been on cimetidine. She complains of vague discomfort of fullness in retrosternal area. She also has noted intermittent dysphagia resulting in hiccups. She has gotten spontaneous relief each time. She has not had an episode of food impaction that led to regurgitation for relief since her last esophageal dilation 6 months ago. She is a slow eater and chews her food very good. Her appetite is good. She has gained 5 lbs since her last exam. Her bowels move daily. She denies melena or rectal bleeding. She says her headache is much better controlled than she is willing to try different PPI if it would help control her symptoms.   Current Medications: Outpatient Encounter Medications as of 10/24/2020  Medication Sig  . Adalimumab 40 MG/0.8ML PNKT Inject 40 mg into the skin every 14 (fourteen) days. Every other week on Sunday  . APPLE CIDER VINEGAR PO Take 2 capsules by mouth daily.  Marland Kitchen BOTOX 100 units SOLR injection Inject 100 Units as directed as directed. Every 12 weeks  . cimetidine (TAGAMET) 400 MG tablet TAKE 1 TABLET(400 MG) BY MOUTH TWICE DAILY  . citalopram (CELEXA) 20 MG tablet Take 10 mg by mouth at bedtime.  Marland Kitchen estradiol (ESTRACE) 0.5 MG tablet Take 0.5 mg by mouth daily.  . folic acid  (FOLVITE) 1 MG tablet Take 1 mg by mouth daily.   Marland Kitchen ibuprofen (ADVIL,MOTRIN) 200 MG tablet Take 400-600 mg by mouth every 8 (eight) hours as needed (headaches/pain.).  Marland Kitchen Levothyroxine Sodium (TIROSINT) 88 MCG CAPS Take 1 capsule (88 mcg total) by mouth daily before breakfast.  . loratadine (CLARITIN) 10 MG tablet Take 10 mg by mouth at bedtime.   . methotrexate (RHEUMATREX) 2.5 MG tablet Take 10 mg by mouth once a week. Caution:Chemotherapy. Protect from light.  . montelukast (SINGULAIR) 10 MG tablet TAKE 1 TABLET(10 MG) BY MOUTH AT BEDTIME (Patient taking differently: Take 10 mg by mouth at bedtime.)  . Multiple Vitamins-Minerals (IMMUNE SUPPORT PO) Take 1 Scoop by mouth daily.  . naproxen (NAPROSYN) 500 MG tablet TAKE 1 TABLET BY MOUTH EVERY 12 HOURS AS NEEDED  . propranolol ER (INDERAL LA) 60 MG 24 hr capsule TAKE 1 CAPSULE(60 MG) BY MOUTH DAILY  . SUMAtriptan (IMITREX) 100 MG tablet TAKE 1 TABLET BY MOUTH AT ONSET OF HEADACHE. MAY REPEAT IN 2 HOURS IF HEADACHE PERSISTS (Patient taking differently: Take 100 mg by mouth every 2 (two) hours as needed for migraine.)  . Tetrahydrozoline-Zn Sulfate (ALLERGY RELIEF EYE DROPS OP) Place 1-2 drops into both eyes 3 (three) times daily as needed (FOR IRRITATED/ALLERGY EYES).  . Vitamin D, Ergocalciferol, (DRISDOL) 50000 units CAPS capsule Take 50,000 Units by mouth every Sunday.   . fluticasone (FLOVENT HFA) 220 MCG/ACT inhaler 4 puffs twice daily as directed. Remember not to eat or drink anything for 3 h after  each dose. (Patient not taking: Reported on 10/24/2020)   No facility-administered encounter medications on file as of 10/24/2020.     Objective: Blood pressure (!) 143/85, pulse 73, temperature 98.9 F (37.2 C), temperature source Oral, height 5\' 2"  (1.575 m), weight 135 lb (61.2 kg), last menstrual period 03/31/2017. Patient is alert and in no acute distress. Oropharyngeal mucosa is normal. Conjunctiva is pink. Sclera is  nonicteric Oropharyngeal mucosa is normal. No neck masses or thyromegaly noted. Cardiac exam with regular rhythm normal S1 and S2. No murmur or gallop noted. Lungs are clear to auscultation. Abdomen is symmetrical soft and nontender with organomegaly or masses. No LE edema or clubbing noted.  Assessment:  #1. Chronic GERD. She has history of erosive reflux esophagitis. Symptoms are not well controlled with cimetidine. We will try her on a different PPI and hopefully it would not result in headache.  #2. History of eosinophilic esophagitis. She was last treated with fluticasone 6 months ago and now she is on montelukast. She is also on biologic for rheumatoid arthritis which sometimes is helpful in controlling the disease but obviously not in her case.  #3. Patient is up-to-date screening for CRC. Last colonoscopy in June 2019 was normal. Her father was diagnosed with colon carcinoma at age 68 which would be considered sporadic cancer and deafly not increase patient's risk. Next screening exam would be in June 2029.   Plan:  Discontinue cimetidine. Lansoprazole 30 mg by mouth 30 minutes before breakfast daily. Famotidine OTC 20 mg p.o. nightly for 1 month and thereafter on as-needed basis. Patient will call with progress report in 1 month. Office visit in 6 months.

## 2020-10-24 NOTE — Patient Instructions (Signed)
Take Prevacid/lansoprazole 30 mg by mouth 30 minutes before breakfast daily Take Pepcid/famotidine OTC 20 mg daily at bedtime for 1 month and thereafter on as-needed basis. Please call office with progress report in 1 month.

## 2020-11-01 ENCOUNTER — Encounter (INDEPENDENT_AMBULATORY_CARE_PROVIDER_SITE_OTHER): Payer: Self-pay

## 2020-11-07 ENCOUNTER — Encounter (INDEPENDENT_AMBULATORY_CARE_PROVIDER_SITE_OTHER): Payer: Self-pay

## 2020-11-15 ENCOUNTER — Telehealth: Payer: Self-pay

## 2020-11-15 DIAGNOSIS — E039 Hypothyroidism, unspecified: Secondary | ICD-10-CM

## 2020-11-15 NOTE — Telephone Encounter (Signed)
Updated order sent to [facility].

## 2020-11-15 NOTE — Telephone Encounter (Signed)
Patient is going for labs. Can you update lab orders for [facility]

## 2020-11-21 ENCOUNTER — Ambulatory Visit: Payer: BC Managed Care – PPO | Admitting: "Endocrinology

## 2020-11-21 ENCOUNTER — Ambulatory Visit: Payer: Self-pay | Admitting: "Endocrinology

## 2020-11-28 ENCOUNTER — Other Ambulatory Visit: Payer: Self-pay | Admitting: "Endocrinology

## 2020-12-11 ENCOUNTER — Encounter (INDEPENDENT_AMBULATORY_CARE_PROVIDER_SITE_OTHER): Payer: Self-pay

## 2020-12-13 ENCOUNTER — Other Ambulatory Visit (INDEPENDENT_AMBULATORY_CARE_PROVIDER_SITE_OTHER): Payer: Self-pay

## 2020-12-13 DIAGNOSIS — K2100 Gastro-esophageal reflux disease with esophagitis, without bleeding: Secondary | ICD-10-CM

## 2020-12-13 DIAGNOSIS — K200 Eosinophilic esophagitis: Secondary | ICD-10-CM

## 2020-12-13 DIAGNOSIS — R1319 Other dysphagia: Secondary | ICD-10-CM

## 2020-12-13 DIAGNOSIS — R1310 Dysphagia, unspecified: Secondary | ICD-10-CM

## 2020-12-13 LAB — BASIC METABOLIC PANEL
BUN: 17 (ref 4–21)
Creatinine: 0.8 (ref 0.5–1.1)

## 2020-12-13 LAB — COMPREHENSIVE METABOLIC PANEL
GFR calc Af Amer: >60
GFR calc non Af Amer: >60

## 2020-12-13 LAB — LIPID PANEL
Cholesterol: 272 — AB (ref 0–200)
HDL: 68 (ref 35–70)
LDL Cholesterol: 181
Triglycerides: 116 (ref 40–160)

## 2020-12-13 LAB — TSH: TSH: 1.38 (ref 0.41–5.90)

## 2020-12-13 MED ORDER — RABEPRAZOLE SODIUM 20 MG PO TBEC: DELAYED_RELEASE_TABLET | ORAL | 3 refills | Status: DC

## 2020-12-19 ENCOUNTER — Other Ambulatory Visit (INDEPENDENT_AMBULATORY_CARE_PROVIDER_SITE_OTHER): Payer: Self-pay | Admitting: Internal Medicine

## 2020-12-19 NOTE — Telephone Encounter (Signed)
Last seen for Jerrye Bushy in December 2021 by Dr. Laural Golden

## 2021-01-12 ENCOUNTER — Other Ambulatory Visit: Payer: Self-pay

## 2021-01-12 ENCOUNTER — Ambulatory Visit: Payer: BC Managed Care – PPO | Admitting: Neurology

## 2021-01-12 DIAGNOSIS — G43709 Chronic migraine without aura, not intractable, without status migrainosus: Secondary | ICD-10-CM | POA: Diagnosis not present

## 2021-01-12 MED ORDER — ONABOTULINUMTOXINA 100 UNITS IJ SOLR
200.0000 [IU] | Freq: Once | INTRAMUSCULAR | Status: AC
  Administered 2021-01-12: 155 [IU] via INTRAMUSCULAR

## 2021-01-12 NOTE — Progress Notes (Signed)
[facility]   Procedure Note Botox  Attending: Dr. Chastelyn Athens  Preoperative Diagnosis(es): Chronic migraine  Consent obtained from: The patient Benefits discussed included, but were not limited to decreased muscle tightness, increased joint range of motion, and decreased pain.  Risk discussed included, but were not limited pain and discomfort, bleeding, bruising, excessive weakness, venous thrombosis, muscle atrophy and dysphagia.  Anticipated outcomes of the procedure as well as he risks and benefits of the alternatives to the procedure, and the roles and tasks of the personnel to be involved, were discussed with the patient, and the patient consents to the procedure and agrees to proceed. A copy of the patient medication guide was given to the patient which explains the blackbox warning.  Patients identity and treatment sites confirmed Yes.  .  Details of Procedure: Skin was cleaned with alcohol. Prior to injection, the needle plunger was aspirated to make sure the needle was not within a blood vessel.  There was no blood retrieved on aspiration.    Following is a summary of the muscles injected  And the amount of Botulinum toxin used:  Dilution 200 units of Botox was reconstituted with 4 ml of preservative free normal saline. Time of reconstitution: At the time of the office visit (<30 minutes prior to injection)   Injections  155 total units of Botox was injected with a 30 gauge needle.  Injection Sites: L occipitalis: 15 units- 3 sites  R occiptalis: 15 units- 3 sites  L upper trapezius: 15 units- 3 sites R upper trapezius: 15 units- 3 sits          L paraspinal: 10 units- 2 sites R paraspinal: 10 units- 2 sites  Face L frontalis(2 injection sites):10 units   R frontalis(2 injection sites):10 units         L corrugator: 5 units   R corrugator: 5 units           Procerus: 5 units   L temporalis: 20 units R temporalis: 20 units   Agent:  200 units of botulinum Type  A (Onobotulinum Toxin type A) was reconstituted with 4 ml of preservative free normal saline.  Time of reconstitution: At the time of the office visit (<30 minutes prior to injection)     Total injected (Units): 155  Total wasted (Units): none wasted  Patient tolerated procedure well without complications.   Reinjection is anticipated in 3 months.    

## 2021-01-15 ENCOUNTER — Telehealth: Payer: Self-pay

## 2021-01-15 NOTE — Telephone Encounter (Signed)
Pt did labs in February for her thyroid-can you use those or does she need to repeat labs? She will have those faxed to [place].

## 2021-01-15 NOTE — Telephone Encounter (Signed)
If patient calls back-she needs to just have her labs faxed to [place] and needs appt. I left her VM

## 2021-01-15 NOTE — Telephone Encounter (Signed)
Yes, she does not need another set of labs.

## 2021-01-25 ENCOUNTER — Ambulatory Visit: Payer: BC Managed Care – PPO | Admitting: "Endocrinology

## 2021-01-25 ENCOUNTER — Other Ambulatory Visit: Payer: Self-pay

## 2021-01-25 ENCOUNTER — Encounter: Payer: Self-pay | Admitting: "Endocrinology

## 2021-01-25 VITALS — BP 128/76 | HR 76 | Ht 62.0 in | Wt 136.6 lb

## 2021-01-25 DIAGNOSIS — E782 Mixed hyperlipidemia: Secondary | ICD-10-CM

## 2021-01-25 DIAGNOSIS — E039 Hypothyroidism, unspecified: Secondary | ICD-10-CM

## 2021-01-25 NOTE — Progress Notes (Signed)
01/25/2021  Endocrinology follow-up note   Subjective:    Patient ID: Laurie Cherry, female    DOB: September 18, 1961, PCP Thea Alken   Past Medical History:  Diagnosis Date  . Depression   . Esophagitis   . Family history of adverse reaction to anesthesia    Sister  . Fibroids   . GERD (gastroesophageal reflux disease)   . Hypothyroidism   . Rheumatoid arthritis ([place])   . Rheumatoid arthritis ([place]) 01/20/2017  . Worsening headaches    Past Surgical History:  Procedure Laterality Date  . ABDOMINAL HYSTERECTOMY N/A 04/03/2017   Procedure: TOTAL HYSTERECTOMY ABDOMINAL;  Surgeon: Louretta Shorten, MD;  Location: [facility];  Service: Gynecology;  Laterality: N/A;  . BIOPSY  04/10/2018   Procedure: BIOPSY;  Surgeon: Rogene Houston, MD;  Location: [facility];  Service: Endoscopy;;  esophegus   . BIOPSY  10/19/2018   Procedure: BIOPSY;  Surgeon: Rogene Houston, MD;  [facility];  Service: Endoscopy;;  esophageal   . BIOPSY  04/12/2020   Procedure: BIOPSY;  Surgeon: Rogene Houston, MD;  [facility];  Service: Endoscopy;;  . COLONOSCOPY    . COLONOSCOPY    . COLONOSCOPY N/A 04/10/2018   Procedure: COLONOSCOPY;  Surgeon: Rogene Houston, MD;  [facility];  Service: Endoscopy;  Laterality: N/A;  . ESOPHAGEAL DILATION N/A 04/10/2018   Procedure: ESOPHAGEAL DILATION;  Surgeon: Rogene Houston, MD;  [facility];  Service: Endoscopy;  Laterality: N/A;  balloon dilation  . ESOPHAGEAL DILATION N/A 10/19/2018   Procedure: ESOPHAGEAL DILATION;  Surgeon: Rogene Houston, MD;  [facility];  Service: Endoscopy;  Laterality: N/A;  . ESOPHAGEAL DILATION N/A 04/12/2020   Procedure: ESOPHAGEAL DILATION;  Surgeon: Rogene Houston, MD;  [facility];  Service: Endoscopy;  Laterality: N/A;  . ESOPHAGOGASTRODUODENOSCOPY    . ESOPHAGOGASTRODUODENOSCOPY N/A 04/10/2018   Procedure: ESOPHAGOGASTRODUODENOSCOPY (EGD);  Surgeon:  Rogene Houston, MD;  [facility];  Service: Endoscopy;  Laterality: N/A;  1:25  . ESOPHAGOGASTRODUODENOSCOPY N/A 10/19/2018   Procedure: ESOPHAGOGASTRODUODENOSCOPY (EGD);  Surgeon: Rogene Houston, MD;  [facility];  Service: Endoscopy;  Laterality: N/A;  7:30  . ESOPHAGOGASTRODUODENOSCOPY N/A 04/12/2020   Procedure: ESOPHAGOGASTRODUODENOSCOPY (EGD);  Surgeon: Rogene Houston, MD;  [facility];  Service: Endoscopy;  Laterality: N/A;  235  . EYE SURGERY Right   . SALPINGOOPHORECTOMY Bilateral 04/03/2017   Procedure: SALPINGO OOPHORECTOMY;  Surgeon: Louretta Shorten, MD;  Location: [facility];  Service: Gynecology;  Laterality: Bilateral;   Social History   Socioeconomic History  . Marital status: Married    Spouse name: Not on file  . Number of children: 1  . Years of education: Not on file  . Highest education level: Master's degree (e.g., MA, MS, MEng, MEd, MSW, MBA)  Occupational History  . Not on file  Tobacco Use  . Smoking status: Never Smoker  . Smokeless tobacco: Never Used  Vaping Use  . Vaping Use: Never used  Substance and Sexual Activity  . Alcohol use: No    Alcohol/week: 0.0 standard drinks  . Drug use: No  . Sexual activity: Not on file  Other Topics Concern  . Not on file  Social History Narrative   Pt is married lives with spouse in 2 story home she has 1 children, Master Degree in Education   She is right handed, and drinks coffee once a  day, 1 soda a day and not tea   She does not work out at all   Social Determinants of Radio broadcast assistant Strain: Not on file  Food Insecurity: Not on file  Transportation Needs: Not on file  Physical Activity: Not on file  Stress: Not on file  Social Connections: Not on file   Outpatient Encounter Medications as of 01/25/2021  Medication Sig  . Adalimumab 40 MG/0.8ML PNKT Inject 40 mg into the skin every 14 (fourteen) days. Every other week on Sunday  . APPLE CIDER VINEGAR PO Take 2  capsules by mouth daily.  Marland Kitchen BOTOX 100 units SOLR injection Inject 100 Units as directed as directed. Every 12 weeks  . citalopram (CELEXA) 20 MG tablet Take 10 mg by mouth at bedtime.  Marland Kitchen estradiol (ESTRACE) 0.5 MG tablet Take 0.5 mg by mouth daily.  . folic acid (FOLVITE) 1 MG tablet Take 1 mg by mouth daily.   Marland Kitchen loratadine (CLARITIN) 10 MG tablet Take 10 mg by mouth at bedtime.   . methotrexate (RHEUMATREX) 2.5 MG tablet Take 10 mg by mouth once a week. Caution:Chemotherapy. Protect from light.  . montelukast (SINGULAIR) 10 MG tablet Take 1 tablet (10 mg total) by mouth at bedtime.  . Multiple Vitamins-Minerals (IMMUNE SUPPORT PO) Take 1 Scoop by mouth daily.  . naproxen (NAPROSYN) 500 MG tablet TAKE 1 TABLET BY MOUTH EVERY 12 HOURS AS NEEDED  . propranolol ER (INDERAL LA) 60 MG 24 hr capsule TAKE 1 CAPSULE(60 MG) BY MOUTH DAILY  . RABEprazole (ACIPHEX) 20 MG tablet Take one po 30 minutes prior to breakfast every morning.  . SUMAtriptan (IMITREX) 100 MG tablet TAKE 1 TABLET BY MOUTH AT ONSET OF HEADACHE. MAY REPEAT IN 2 HOURS IF HEADACHE PERSISTS (Patient taking differently: Take 100 mg by mouth every 2 (two) hours as needed for migraine.)  . TIROSINT 88 MCG CAPS TAKE ONE CAPSULE BY MOUTH EVERY MORNING BEFORE BREAKFAST  . Vitamin D, Ergocalciferol, (DRISDOL) 50000 units CAPS capsule Take 50,000 Units by mouth every Sunday.    No facility-administered encounter medications on file as of 01/25/2021.   ALLERGIES: Allergies  Allergen Reactions  . Arava [Leflunomide] Hives  . Azithromycin Other (See Comments)    Severe bloating, abd pain  . Cefuroxime Axetil Other (See Comments)    Increased bloating, abd pain  . Clarithromycin Other (See Comments)    abd pain  . Crestor [Rosuvastatin]     Per patient she had more joint pain  . Etanercept Hives and Other (See Comments)    Injection site irritation (ENBREL)  . Levofloxacin Hives and Other (See Comments)    Severe dizziness  . Sulfa  Antibiotics Hives and Other (See Comments)    abd pain  . Methotrexate Derivatives Other (See Comments)    Injection ONLY (irritation at the site of injection)   VACCINATION STATUS:  There is no immunization history on file for this patient.  HPI  60 year old female patient with medical history as above.  She is being seen in follow-up for hypothyroidism.  .  She states that she was diagnosed with hypothyroidism approximately at age 53. -She does not tolerate levothyroxine/Synthroid.  She is currently on Tirosint 88 mcg p.o. daily before breakfast.    She reports compliance to this medication. -This is her yearly follow-up.  She denies any new complaints.  She was found to have significant dyslipidemia, currently on trial of statin medications.  She reports intolerance to most of the statin  options available including Crestor, Lipitor.  She is currently trying pravastatin at low doses every other day.    - She has family  history of thyroid dysfunction in multiple members of her family.  She denies any history of goiter. She denies any family history of thyroid cancer. - She underwent total abdominal hysterectomy/ salphigo oophorectomy due to a large ovarian cyst. Findings were reportedly benign.  -She is on multiple medications for her rheumatoid arthritis.   Review of Systems Limited as above.  Objective:    BP 128/76   Pulse 76   Ht 5\' 2"  (1.575 m)   Wt 136 lb 9.6 oz (62 kg)   LMP 03/31/2017   BMI 24.98 kg/m   Wt Readings from Last 3 Encounters:  01/25/21 136 lb 9.6 oz (62 kg)  10/24/20 135 lb (61.2 kg)  06/29/20 134 lb (60.8 kg)    Physical Exam    Recent Results (from the past 2160 hour(s))  Basic metabolic panel     Status: None   Collection Time: 12/13/20 12:00 AM  Result Value Ref Range   BUN 17 4 - 21   Creatinine 0.8 0.5 - 1.1  Comprehensive metabolic panel     Status: None   Collection Time: 12/13/20 12:00 AM  Result Value Ref Range   GFR calc Af Amer  >60    GFR calc non Af Amer >60   Lipid panel     Status: Abnormal   Collection Time: 12/13/20 12:00 AM  Result Value Ref Range   Triglycerides 116 40 - 160   Cholesterol 272 (A) 0 - 200   HDL 68 35 - 70   LDL Cholesterol 181   TSH     Status: None   Collection Time: 12/13/20 12:00 AM  Result Value Ref Range   TSH 1.38 0.41 - 5.90    - On 08/16/2016 her free T4 was 1.36, improving from prior studies. - On 05/09/2016: Free T4 0.63, TSH 7.58, TPO antibodies 13  Assessment & Plan:   1.  Hypothyroidism I reviewed her current and previous labs and imaging studies. -Her previsit thyroid function tests are consistent with appropriate replacement.  She is advised to continue  Tirosint 88 mcg p.o. daily before breakfast.     - We discussed about the correct intake of her thyroid hormone, on empty stomach at fasting, with water, separated by at least 30 minutes from breakfast and other medications,  and separated by more than 4 hours from calcium, iron, multivitamins, acid reflux medications (PPIs). -Patient is made aware of the fact that thyroid hormone replacement is needed for life, dose to be adjusted by periodic monitoring of thyroid function tests.   -She has no clinical goiter, or thyroid ultrasound from July 2019 was unremarkable except for shrunk bilateral thyroid lobes.    2.  Hyperlipidemia She has significant dyslipidemia with LDL of 181.  She has history of statin intolerance, currently on trial of pravastatin.  She will have repeat fasting lipid panel 6 months.  She will be considered for Repatha if she does not tolerate statins, and if LDL remains high.  - I advised patient to maintain close follow up with Ephriam Jenkins E for primary care needs.     - Time spent on this patient care encounter:  30 minutes of which 50% was spent in  counseling and the rest reviewing  her current and  previous labs / studies and medications  doses and developing a plan for long  term care, and  documenting this care. Zane Herald  participated in the discussions, expressed understanding, and voiced agreement with the above plans.  All questions were answered to her satisfaction. she is encouraged to contact clinic should she have any questions or concerns prior to her return visit.   Follow up plan: Return in about 6 months (around 07/27/2021) for Fasting Labs  in AM B4 8.  Glade Lloyd, MD Phone: [phone]  Fax: [phone]   01/25/2021, 3:37 PM

## 2021-02-02 ENCOUNTER — Encounter: Payer: Self-pay | Admitting: Neurology

## 2021-02-19 NOTE — Telephone Encounter (Signed)
The next Botox day is July 8, so she would have to wait an extra three weeks if that is okay with her.

## 2021-03-20 ENCOUNTER — Other Ambulatory Visit: Payer: Self-pay | Admitting: "Endocrinology

## 2021-04-11 ENCOUNTER — Telehealth: Payer: Self-pay

## 2021-04-11 NOTE — Telephone Encounter (Signed)
Patient is confirmed botox shipment to be [facility] delivery date 04/26/2021

## 2021-04-13 ENCOUNTER — Ambulatory Visit: Payer: Self-pay | Admitting: Neurology

## 2021-04-17 ENCOUNTER — Telehealth: Payer: Self-pay | Admitting: Neurology

## 2021-04-17 NOTE — Telephone Encounter (Signed)
Spoke to [place] @ [place] [phone] - patient has given consent and botox to be delivered by 04/26/21

## 2021-04-24 ENCOUNTER — Ambulatory Visit (INDEPENDENT_AMBULATORY_CARE_PROVIDER_SITE_OTHER): Payer: BC Managed Care – PPO | Admitting: Internal Medicine

## 2021-04-26 NOTE — Telephone Encounter (Signed)
Botox has been received, signed in, and placed in fridge.

## 2021-05-04 ENCOUNTER — Other Ambulatory Visit: Payer: Self-pay

## 2021-05-04 ENCOUNTER — Ambulatory Visit: Payer: BC Managed Care – PPO | Admitting: Neurology

## 2021-05-04 DIAGNOSIS — G43709 Chronic migraine without aura, not intractable, without status migrainosus: Secondary | ICD-10-CM | POA: Diagnosis not present

## 2021-05-04 MED ORDER — ONABOTULINUMTOXINA 100 UNITS IJ SOLR
200.0000 [IU] | Freq: Once | INTRAMUSCULAR | Status: AC
  Administered 2021-05-04: 155 [IU] via INTRAMUSCULAR

## 2021-05-04 NOTE — Progress Notes (Signed)
[facility]  ° °Procedure Note Botox ° °Attending: Dr. Estreya Clay ° °Preoperative Diagnosis(es): Chronic migraine ° °Consent obtained from: The patient °Benefits discussed included, but were not limited to decreased muscle tightness, increased joint range of motion, and decreased pain.  Risk discussed included, but were not limited pain and discomfort, bleeding, bruising, excessive weakness, venous thrombosis, muscle atrophy and dysphagia.  Anticipated outcomes of the procedure as well as he risks and benefits of the alternatives to the procedure, and the roles and tasks of the personnel to be involved, were discussed with the patient, and the patient consents to the procedure and agrees to proceed. A copy of the patient medication guide was given to the patient which explains the blackbox warning. ° °Patients identity and treatment sites confirmed Yes.  . ° °Details of Procedure: °Skin was cleaned with alcohol. Prior to injection, the needle plunger was aspirated to make sure the needle was not within a blood vessel.  There was no blood retrieved on aspiration.   ° °Following is a summary of the muscles injected  And the amount of Botulinum toxin used: ° °Dilution °200 units of Botox was reconstituted with 4 ml of preservative free normal saline. °Time of reconstitution: At the time of the office visit (<30 minutes prior to injection)  ° °Injections  °155 total units of Botox was injected with a 30 gauge needle. ° °Injection Sites: °L occipitalis: 15 units- 3 sites  °R occiptalis: 15 units- 3 sites ° °L upper trapezius: 15 units- 3 sites °R upper trapezius: 15 units- 3 sits          °L paraspinal: 10 units- 2 sites °R paraspinal: 10 units- 2 sites ° °Face °L frontalis(2 injection sites):10 units   °R frontalis(2 injection sites):10 units         °L corrugator: 5 units   °R corrugator: 5 units           °Procerus: 5 units   °L temporalis: 20 units °R temporalis: 20 units  ° °Agent:  °200 units of botulinum Type  A (Onobotulinum Toxin type A) was reconstituted with 4 ml of preservative free normal saline.  °Time of reconstitution: At the time of the office visit (<30 minutes prior to injection)  ° ° ° Total injected (Units):  155 ° Total wasted (Units):  45 ° °Patient tolerated procedure well without complications.   °Reinjection is anticipated in 3 months. ° ° °

## 2021-05-04 NOTE — Addendum Note (Signed)
Addended by: Venetia Night on: 05/04/2021 04:22 PM   Modules accepted: Orders

## 2021-06-01 ENCOUNTER — Other Ambulatory Visit: Payer: Self-pay

## 2021-06-01 MED ORDER — TIROSINT 88 MCG PO CAPS: ORAL_CAPSULE | ORAL | 0 refills | Status: DC

## 2021-06-05 ENCOUNTER — Encounter (INDEPENDENT_AMBULATORY_CARE_PROVIDER_SITE_OTHER): Payer: Self-pay

## 2021-07-19 ENCOUNTER — Other Ambulatory Visit: Payer: Self-pay | Admitting: Neurology

## 2021-07-19 DIAGNOSIS — G43009 Migraine without aura, not intractable, without status migrainosus: Secondary | ICD-10-CM

## 2021-07-25 ENCOUNTER — Telehealth: Payer: Self-pay

## 2021-07-25 NOTE — Telephone Encounter (Signed)
Spoke to pt, Advised we have not recevied her Botox for her visit 08/03/21. Please call [facility] pharmacy to give consent for delivery.  Per pt she was advised that she has wait til  07/30/21 to give consent

## 2021-07-30 ENCOUNTER — Ambulatory Visit: Payer: BC Managed Care – PPO | Admitting: "Endocrinology

## 2021-08-03 ENCOUNTER — Telehealth: Payer: Self-pay

## 2021-08-03 ENCOUNTER — Other Ambulatory Visit: Payer: Self-pay

## 2021-08-03 ENCOUNTER — Ambulatory Visit: Payer: BC Managed Care – PPO | Admitting: Neurology

## 2021-08-03 DIAGNOSIS — G43709 Chronic migraine without aura, not intractable, without status migrainosus: Secondary | ICD-10-CM | POA: Diagnosis not present

## 2021-08-03 MED ORDER — ONABOTULINUMTOXINA 100 UNITS IJ SOLR
200.0000 [IU] | Freq: Once | INTRAMUSCULAR | Status: AC
  Administered 2021-08-03: 155 [IU] via INTRAMUSCULAR

## 2021-08-03 NOTE — Progress Notes (Signed)
[facility]   Procedure Note Botox  Attending: Dr. Metta Clines  Preoperative Diagnosis(es): Chronic migraine  Consent obtained from: The patient Benefits discussed included, but were not limited to decreased muscle tightness, increased joint range of motion, and decreased pain.  Risk discussed included, but were not limited pain and discomfort, bleeding, bruising, excessive weakness, venous thrombosis, muscle atrophy and dysphagia.  Anticipated outcomes of the procedure as well as he risks and benefits of the alternatives to the procedure, and the roles and tasks of the personnel to be involved, were discussed with the patient, and the patient consents to the procedure and agrees to proceed. A copy of the patient medication guide was given to the patient which explains the blackbox warning.  Patients identity and treatment sites confirmed Yes.  .  Details of Procedure: Skin was cleaned with alcohol. Prior to injection, the needle plunger was aspirated to make sure the needle was not within a blood vessel.  There was no blood retrieved on aspiration.    Following is a summary of the muscles injected  And the amount of Botulinum toxin used:  Dilution 200 units of Botox was reconstituted with 4 ml of preservative free normal saline. Time of reconstitution: At the time of the office visit (<30 minutes prior to injection)   Injections  155 total units of Botox was injected with a 30 gauge needle.  Injection Sites: L occipitalis: 15 units- 3 sites  R occiptalis: 15 units- 3 sites  L upper trapezius: 15 units- 3 sites R upper trapezius: 15 units- 3 sits          L paraspinal: 10 units- 2 sites R paraspinal: 10 units- 2 sites  Face L frontalis(2 injection sites):10 units   R frontalis(2 injection sites):10 units         L corrugator: 5 units   R corrugator: 5 units           Procerus: 5 units   L temporalis: 20 units R temporalis: 20 units   Agent:  200 units of botulinum Type  A (Onobotulinum Toxin type A) was reconstituted with 4 ml of preservative free normal saline.  Time of reconstitution: At the time of the office visit (<30 minutes prior to injection)     Total injected (Units):  155  Total wasted (Units):  none wasted  Patient tolerated procedure well without complications.   Reinjection is anticipated in 3 months.

## 2021-08-03 NOTE — Telephone Encounter (Signed)
Pt would like you to order a lab for her to have her Cholesterol checked. She said you and her discussed this last appointment. Thank you

## 2021-08-06 ENCOUNTER — Other Ambulatory Visit: Payer: Self-pay | Admitting: "Endocrinology

## 2021-08-06 DIAGNOSIS — E782 Mixed hyperlipidemia: Secondary | ICD-10-CM

## 2021-08-08 LAB — LIPID PANEL
Chol/HDL Ratio: 5 ratio — ABNORMAL HIGH (ref 0.0–4.4)
Chol/HDL Ratio: 5.2 ratio — ABNORMAL HIGH (ref 0.0–4.4)
Cholesterol, Total: 244 mg/dL — ABNORMAL HIGH (ref 100–199)
Cholesterol, Total: 248 mg/dL — ABNORMAL HIGH (ref 100–199)
HDL: 48 mg/dL (ref >39)
HDL: 49 mg/dL (ref >39)
LDL Chol Calc (NIH): 171 mg/dL — ABNORMAL HIGH (ref 0–99)
LDL Chol Calc (NIH): 177 mg/dL — ABNORMAL HIGH (ref 0–99)
Triglycerides: 128 mg/dL (ref 0–149)
Triglycerides: 132 mg/dL (ref 0–149)
VLDL Cholesterol Cal: 23 mg/dL (ref 5–40)
VLDL Cholesterol Cal: 24 mg/dL (ref 5–40)

## 2021-08-08 LAB — T4, FREE: Free T4: 1.67 ng/dL (ref 0.82–1.77)

## 2021-08-08 LAB — TSH: TSH: 0.198 u[IU]/mL — ABNORMAL LOW (ref 0.450–4.500)

## 2021-08-10 ENCOUNTER — Telehealth: Payer: Self-pay | Admitting: "Endocrinology

## 2021-08-10 ENCOUNTER — Other Ambulatory Visit: Payer: Self-pay | Admitting: "Endocrinology

## 2021-08-10 MED ORDER — TIROSINT 75 MCG PO CAPS: 75.0000 ug | ORAL_CAPSULE | Freq: Every day | ORAL | 1 refills | Status: DC

## 2021-08-10 NOTE — Telephone Encounter (Signed)
Informed pt .

## 2021-08-10 NOTE — Telephone Encounter (Signed)
Pt has an appt on Tuesday 10/18, she states she is out of her thyroid medication and is needing a refill but does not know if her medication is going to need adjusted by her recent labs. Please advise.   T[address]INT [address] [place], [address] [place] Phone:  [phone]  Fax:  [phone]

## 2021-08-14 ENCOUNTER — Ambulatory Visit: Payer: BC Managed Care – PPO | Admitting: "Endocrinology

## 2021-08-14 ENCOUNTER — Encounter: Payer: Self-pay | Admitting: "Endocrinology

## 2021-08-14 ENCOUNTER — Other Ambulatory Visit: Payer: Self-pay

## 2021-08-14 VITALS — BP 128/84 | HR 80 | Ht 62.0 in | Wt 134.6 lb

## 2021-08-14 DIAGNOSIS — E782 Mixed hyperlipidemia: Secondary | ICD-10-CM

## 2021-08-14 DIAGNOSIS — E039 Hypothyroidism, unspecified: Secondary | ICD-10-CM | POA: Diagnosis not present

## 2021-08-14 MED ORDER — ROSUVASTATIN CALCIUM 5 MG PO TABS: 5.0000 mg | ORAL_TABLET | Freq: Every day | ORAL | 2 refills | Status: DC

## 2021-08-14 NOTE — Progress Notes (Signed)
08/14/2021  Endocrinology follow-up note   Subjective:    Patient ID: Laurie Cherry, female    DOB: [birth year], PCP Ephriam Jenkins E   Past Medical History:  Diagnosis Date   Depression    Esophagitis    Family history of adverse reaction to anesthesia    Sister   Fibroids    GERD (gastroesophageal reflux disease)    Hypothyroidism    Rheumatoid arthritis ([place])    Rheumatoid arthritis ([place]) 01/20/2017   Worsening headaches    Past Surgical History:  Procedure Laterality Date   ABDOMINAL HYSTERECTOMY N/A 04/03/2017   Procedure: TOTAL HYSTERECTOMY ABDOMINAL;  Surgeon: Louretta Shorten, MD;  Location: [facility];  Service: Gynecology;  Laterality: N/A;   BIOPSY  04/10/2018   Procedure: BIOPSY;  Surgeon: Rogene Houston, MD;  [facility]: [facility];  Service: Endoscopy;;  esophegus    BIOPSY  10/19/2018   Procedure: BIOPSY;  Surgeon: Rogene Houston, MD;  [facility];  Service: Endoscopy;;  esophageal    BIOPSY  04/12/2020   Procedure: BIOPSY;  Surgeon: Rogene Houston, MD;  [facility];  Service: Endoscopy;;   COLONOSCOPY     COLONOSCOPY     COLONOSCOPY N/A 04/10/2018   Procedure: COLONOSCOPY;  Surgeon: Rogene Houston, MD;  [facility];  Service: Endoscopy;  Laterality: N/A;   ESOPHAGEAL DILATION N/A 04/10/2018   Procedure: ESOPHAGEAL DILATION;  Surgeon: Rogene Houston, MD;  [facility];  Service: Endoscopy;  Laterality: N/A;  balloon dilation   ESOPHAGEAL DILATION N/A 10/19/2018   Procedure: ESOPHAGEAL DILATION;  Surgeon: Rogene Houston, MD;  [facility];  Service: Endoscopy;  Laterality: N/A;   ESOPHAGEAL DILATION N/A 04/12/2020   Procedure: ESOPHAGEAL DILATION;  Surgeon: Rogene Houston, MD;  [facility];  Service: Endoscopy;  Laterality: N/A;   ESOPHAGOGASTRODUODENOSCOPY     ESOPHAGOGASTRODUODENOSCOPY N/A 04/10/2018   Procedure: ESOPHAGOGASTRODUODENOSCOPY (EGD);  Surgeon: Rogene Houston, MD;   [facility];  Service: Endoscopy;  Laterality: N/A;  1:25   ESOPHAGOGASTRODUODENOSCOPY N/A 10/19/2018   Procedure: ESOPHAGOGASTRODUODENOSCOPY (EGD);  Surgeon: Rogene Houston, MD;  [facility];  Service: Endoscopy;  Laterality: N/A;  7:30   ESOPHAGOGASTRODUODENOSCOPY N/A 04/12/2020   Procedure: ESOPHAGOGASTRODUODENOSCOPY (EGD);  Surgeon: Rogene Houston, MD;  [facility];  Service: Endoscopy;  Laterality: N/A;  235   EYE SURGERY Right    SALPINGOOPHORECTOMY Bilateral 04/03/2017   Procedure: SALPINGO OOPHORECTOMY;  Surgeon: Louretta Shorten, MD;  Location: [facility];  Service: Gynecology;  Laterality: Bilateral;   Social History   Socioeconomic History   Marital status: Married    Spouse name: Not on file   Number of children: 1   Years of education: Not on file   Highest education level: Master's degree (e.g., MA, MS, MEng, MEd, MSW, MBA)  Occupational History   Not on file  Tobacco Use   Smoking status: Never   Smokeless tobacco: Never  Vaping Use   Vaping Use: Never used  Substance and Sexual Activity   Alcohol use: No    Alcohol/week: 0.0 standard drinks   Drug use: No   Sexual activity: Not on file  Other Topics Concern   Not on file  Social History Narrative   Pt is married lives with spouse in 2 story home she has 1 children, Master Degree in Education   She is right handed, and drinks coffee once a day, 1  soda a day and not tea   She does not work out at all   Social Determinants of Radio broadcast assistant Strain: Not on file  Food Insecurity: Not on file  Transportation Needs: Not on file  Physical Activity: Not on file  Stress: Not on file  Social Connections: Not on file   Outpatient Encounter Medications as of 08/14/2021  Medication Sig   rosuvastatin (CRESTOR) 5 MG tablet Take 1 tablet (5 mg total) by mouth daily.   Adalimumab 40 MG/0.8ML PNKT Inject 40 mg into the skin every 14 (fourteen) days. Every other week on Sunday    APPLE CIDER VINEGAR PO Take 2 capsules by mouth daily.   BOTOX 100 units SOLR injection Inject 100 Units as directed as directed. Every 12 weeks   citalopram (CELEXA) 20 MG tablet Take 10 mg by mouth at bedtime.   estradiol (ESTRACE) 0.5 MG tablet Take 0.5 mg by mouth daily.   folic acid (FOLVITE) 1 MG tablet Take 1 mg by mouth daily.    loratadine (CLARITIN) 10 MG tablet Take 10 mg by mouth at bedtime.    methotrexate (RHEUMATREX) 2.5 MG tablet Take 10 mg by mouth once a week. Caution:Chemotherapy. Protect from light.   montelukast (SINGULAIR) 10 MG tablet Take 1 tablet (10 mg total) by mouth at bedtime.   Multiple Vitamins-Minerals (IMMUNE SUPPORT PO) Take 1 Scoop by mouth daily.   naproxen (NAPROSYN) 500 MG tablet TAKE 1 TABLET BY MOUTH EVERY 12 HOURS AS NEEDED   propranolol ER (INDERAL LA) 60 MG 24 hr capsule TAKE 1 CAPSULE(60 MG) BY MOUTH DAILY   RABEprazole (ACIPHEX) 20 MG tablet Take one po 30 minutes prior to breakfast every morning.   SUMAtriptan (IMITREX) 100 MG tablet TAKE 1 TABLET BY MOUTH AT ONSET OF HEADACHE. MAY REPEAT IN 2 HOURS IF HEADACHE PERSISTS (Patient taking differently: Take 100 mg by mouth every 2 (two) hours as needed for migraine.)   TIROSINT 75 MCG CAPS Take 1 capsule (75 mcg total) by mouth daily before breakfast.   Vitamin D, Ergocalciferol, (DRISDOL) 50000 units CAPS capsule Take 50,000 Units by mouth every Sunday.    No facility-administered encounter medications on file as of 08/14/2021.   ALLERGIES: Allergies  Allergen Reactions   Arava [Leflunomide] Hives   Azithromycin Other (See Comments)    Severe bloating, abd pain   Cefuroxime Axetil Other (See Comments)    Increased bloating, abd pain   Clarithromycin Other (See Comments)    abd pain   Crestor [Rosuvastatin]     Per patient she had more joint pain   Etanercept Hives and Other (See Comments)    Injection site irritation (ENBREL)   Levofloxacin Hives and Other (See Comments)    Severe dizziness    Sulfa Antibiotics Hives and Other (See Comments)    abd pain   Methotrexate Derivatives Other (See Comments)    Injection ONLY (irritation at the site of injection)   VACCINATION STATUS:  There is no immunization history on file for this patient.  HPI  60 year old female patient with medical history as above.  She is being seen in follow-up for hypothyroidism.  .  She states that she was diagnosed with hypothyroidism approximately at age 22. -She does not tolerate levothyroxine/Synthroid.  She has been on Tirosint 88 mcg p.o. daily before it was lowered to 75 mcg after her labs showing evidence of over replacement.   -This is her yearly follow-up.  She denies any new complaints today.  he was found to have significant dyslipidemia, currently not on statins.  She does not tolerate statins.  Her original did not provide coverage for Repatha.   She would like to try Crestor again.  - She has family  history of thyroid dysfunction in multiple members of her family.  She denies any history of goiter. She denies any family history of thyroid cancer. - She underwent total abdominal hysterectomy/ salphigo oophorectomy due to a large ovarian cyst. Findings were reportedly benign.  -She is on multiple medications for her rheumatoid arthritis.   Review of Systems Limited as above.  Objective:    BP 128/84   Pulse 80   Ht 5\' 2"  (1.575 m)   Wt 134 lb 9.6 oz (61.1 kg)   LMP 03/31/2017   BMI 24.62 kg/m   Wt Readings from Last 3 Encounters:  08/14/21 134 lb 9.6 oz (61.1 kg)  01/25/21 136 lb 9.6 oz (62 kg)  10/24/20 135 lb (61.2 kg)    Physical Exam    Recent Results (from the past 2160 hour(s))  TSH     Status: Abnormal   Collection Time: 08/07/21 10:08 AM  Result Value Ref Range   TSH 0.198 (L) 0.450 - 4.500 uIU/mL  T4, free     Status: None   Collection Time: 08/07/21 10:08 AM  Result Value Ref Range   Free T4 1.67 0.82 - 1.77 ng/dL  Lipid panel     Status: Abnormal    Collection Time: 08/07/21 10:08 AM  Result Value Ref Range   Cholesterol, Total 248 (H) 100 - 199 mg/dL   Triglycerides 128 0 - 149 mg/dL   HDL 48 >39 mg/dL   VLDL Cholesterol Cal 23 5 - 40 mg/dL   LDL Chol Calc (NIH) 177 (H) 0 - 99 mg/dL   Chol/HDL Ratio 5.2 (H) 0.0 - 4.4 ratio    Comment:                                   T. Chol/HDL Ratio                                             Men  Women                               1/2 Avg.Risk  3.4    3.3                                   Avg.Risk  5.0    4.4                                2X Avg.Risk  9.6    7.1                                3X Avg.Risk 23.4   11.0   Lipid panel     Status: Abnormal   Collection Time: 08/07/21 10:09 AM  Result Value Ref Range   Cholesterol, Total 244 (H) 100 - 199 mg/dL   Triglycerides 132 0 -  149 mg/dL   HDL 49 >39 mg/dL   VLDL Cholesterol Cal 24 5 - 40 mg/dL   LDL Chol Calc (NIH) 171 (H) 0 - 99 mg/dL   Chol/HDL Ratio 5.0 (H) 0.0 - 4.4 ratio    Comment:                                   T. Chol/HDL Ratio                                             Men  Women                               1/2 Avg.Risk  3.4    3.3                                   Avg.Risk  5.0    4.4                                2X Avg.Risk  9.6    7.1                                3X Avg.Risk 23.4   11.0     - On 08/16/2016 her free T4 was 1.36, improving from prior studies. - On 05/09/2016: Free T4 0.63, TSH 7.58, TPO antibodies 13  Assessment & Plan:   1.  Hypothyroidism I reviewed her current and previous labs and imaging studies. -Her previsit thyroid function tests are consistent with slight over replacement.  I discussed and lowered her Tirosint to 75 mcg  p.o. daily before breakfast.     - We discussed about the correct intake of her thyroid hormone, on empty stomach at fasting, with water, separated by at least 30 minutes from breakfast and other medications,  and separated by more than 4 hours from calcium, iron,  multivitamins, acid reflux medications (PPIs). -Patient is made aware of the fact that thyroid hormone replacement is needed for life, dose to be adjusted by periodic monitoring of thyroid function tests.    -She has no clinical goiter, or thyroid ultrasound from July 2019 was unremarkable except for shrunk bilateral thyroid lobes.    2.  Hyperlipidemia She has significant dyslipidemia with LDL remaining high at 171.  She has history of statin intolerance, but would like to try Crestor again.  I discussed and prescribed Crestor 5 mg p.o. nightly.  Side effects and precautions discussed with her.  -I also discussed plant predominant [employer] lifestyle nutrition with her. Her insurance will not pay for Repatha.  - I advised patient to maintain close follow up with Ephriam Jenkins E for primary care needs.    I spent 25 minutes in the care of the patient today including review of labs from Thyroid Function, CMP, and other relevant labs ; imaging/biopsy records (current and previous including abstractions from other facilities); face-to-face time discussing  her lab results and symptoms, medications doses, her options of short and long term treatment based on the latest standards of  care / guidelines;   and documenting the encounter.  Zane Herald  participated in the discussions, expressed understanding, and voiced agreement with the above plans.  All questions were answered to her satisfaction. she is encouraged to contact clinic should she have any questions or concerns prior to her return visit.    Follow up plan: Return in about 6 months (around 02/12/2022) for Fasting Labs  in AM B4 8.  Glade Lloyd, MD Phone: [phone]  Fax: [phone]   08/14/2021, 12:55 PM

## 2021-08-21 ENCOUNTER — Telehealth: Payer: Self-pay

## 2021-08-21 NOTE — Telephone Encounter (Signed)
New message   Laurie Cherry Key: [id number] help? Call us at [phone] Status Sent to [facility] Drug Botox 200UNIT solution Form [employer] Electronic PA Form (2017 NCPDP)  Your demographic data has been sent to [place] successfully!  [employer] typically takes 5-10 minutes to respond, but it may take a little longer in some cases. You will be notified by email when available. You can also check for an update later by opening this request from your dashboard. Please do not fax or call [employer] to resubmit this request. If you need assistance, please chat with [employer] or call us at [phone].  If it has been longer than 24 hours, please reach out to [place].

## 2021-08-27 ENCOUNTER — Other Ambulatory Visit: Payer: Self-pay | Admitting: Neurology

## 2021-08-28 NOTE — Progress Notes (Signed)
Virtual Visit via Video Note The purpose of this virtual visit is to provide medical care while limiting exposure to the novel coronavirus.    Consent was obtained for video visit:  Yes.   Answered questions that patient had about telehealth interaction:  Yes.   I discussed the limitations, risks, security and privacy concerns of performing an evaluation and management service by telemedicine. I also discussed with the patient that there may be a patient responsible charge related to this service. The patient expressed understanding and agreed to proceed.  Pt location: Home Physician Location: office Name of referring provider:  Thea Alken, FNP I connected with Laurie Cherry at patients initiation/request on 08/29/2021 at  2:30 PM EDT by video enabled telemedicine application and verified that I am speaking with the correct person using two identifiers. Pt MRN:  [id number] Pt DOB:  12-05-1960 Video Participants:  Laurie Cherry  Assessment and Plan:   Chronic migraine without aura, without status migrainosus, not intractable - continues to respond well to Botox.  She reports what sound like new frequent tension-type headaches, possibly aggravated by both weather/sinuses and frequent use of Aciphex.    No change in current management for now.  If headaches do not improve by the time I see her for her next Botox appointment, we will make a change in management. Migraine prevention:  Botox every 3 months, propranolol ER 60mg  daily Migraine rescue:  sumatriptan 50-100mg  Limit use of pain relievers to no more than 2 days out of week to prevent risk of rebound or medication-overuse headache. Keep headache diary  History of Present Illness:  Laurie Cherry is a 60 year old right-handed female with rheumatoid arthritis, hypothyroidism, reflux and mild depression who follows up for migraines.   UPDATE: Migraines have been relatively well-controlled.  Moderate intensity, 1 hour  with sumatriptan, and occurring 2-3 times a month.  However, she has been having frequent mild headaches since end of September.  An aching pressure around her eyes, either side of her forehead or along the jaw line/TMJ.  No associated symptoms.  They have been occurring 3 to 4 days a week.  She feels some nasal congestion.  She also reports that she has had worsening GERD, causing her to take her Aciphex frequently.   Current NSAIDS:  naproxen 500mg  Current analgesics:  Excedrin Migraine Current triptans:  Sumatriptan 50mg  (100mg  caused drowsiness) w/wo naproxen 500mg  Current ergotamine:  none Current anti-emetic:  none Current muscle relaxants:  none Current anti-anxiolytic:  none Current sleep aide:  none Current Antihypertensive medications:  Propranolol ER 60mg  Current Antidepressant medications:  Celexa Current Anticonvulsant medications:  none Current anti-CGRP:  none Current Vitamins/Herbal/Supplements:  folic acid Current Antihistamines/Decongestants:  Claritin Other therapy:  Botox Hormone/birth control:  estradiol Other medications:  Adalimumab, methotrexate   Caffeine:  1 cup of coffee daily Alcohol:  no Smoker:  no Diet:  1/2 can of soda daily.  Drinks water Exercise:  no Depression:  stable; Anxiety:  no Other pain:  no Sleep hygiene:  varies   HISTORY: She has a history of "hormonal" migraines most of her life, described as pounding right temporal pain and often associated with her menstrual cycle.  They have pretty much stabilized but she will still get it every now and then.     She was diagnosed with rheumatoid arthritis in 2014.  She had been on methotrexate for about 2 years (stopped a month ago).  In September 2015, she began having new headaches.  It coincided with change in her thyroid medications.  They are left sided, 7/10 non-throbbing pain.    They are associated with photophobia, phonophobia, and osmophobia but not nausea.   Initially, they typically  lasted 3 to 4 days and occur 1 to 2 times a week (however she also has a dull daily headache) Stress is a trigger.  Change in thyroid medication may have been a trigger. These headaches do not correlate with her menstrual cycle.  Sumatriptan with naproxen helps relieve it.   Past NSAIDS:  no Past analgesics:  Excedrin, Tylenol (ineffective) Past abortive triptans:  no Past muscle relaxants:  no Past anti-emetic:  no Past antihypertensive medications:  atenolol (25mg  ineffective and higher doses caused dizziness) Past antidepressant medications:  venlafaxine XR 150mg  Past anticonvulsant medications:  topiramate 25mg  (caused drowsiness, but willing to retry it if needed) Past anti-CGRP:  Aimovig 70mg  (she was on it for 2 months.  Ineffective) Past vitamins/Herbal/Supplements:  no Past antihistamines/decongestants:  no Other past therapies:  no   Family history of headache:  Mom, sister Mother had glioblastoma  Past Medical History: Past Medical History:  Diagnosis Date   Depression    Esophagitis    Family history of adverse reaction to anesthesia    Sister   Fibroids    GERD (gastroesophageal reflux disease)    Hypothyroidism    Rheumatoid arthritis ([place])    Rheumatoid arthritis ([place]) 01/20/2017   Worsening headaches     Medications: Outpatient Encounter Medications as of 08/29/2021  Medication Sig Note   Adalimumab 40 MG/0.8ML PNKT Inject 40 mg into the skin every 14 (fourteen) days. Every other week on Sunday    APPLE CIDER VINEGAR PO Take 2 capsules by mouth daily.    BOTOX 100 units SOLR injection Inject 100 Units as directed as directed. Every 12 weeks 04/04/2020: Last dose on 01/14/20   citalopram (CELEXA) 20 MG tablet Take 10 mg by mouth at bedtime.    estradiol (ESTRACE) 0.5 MG tablet Take 0.5 mg by mouth daily.    folic acid (FOLVITE) 1 MG tablet Take 1 mg by mouth daily.     loratadine (CLARITIN) 10 MG tablet Take 10 mg by mouth at bedtime.     methotrexate  (RHEUMATREX) 2.5 MG tablet Take 10 mg by mouth once a week. Caution:Chemotherapy. Protect from light. 04/04/2020: Either Wed or Friday   montelukast (SINGULAIR) 10 MG tablet Take 1 tablet (10 mg total) by mouth at bedtime.    Multiple Vitamins-Minerals (IMMUNE SUPPORT PO) Take 1 Scoop by mouth daily.    naproxen (NAPROSYN) 500 MG tablet TAKE 1 TABLET BY MOUTH EVERY 12 HOURS AS NEEDED    propranolol ER (INDERAL LA) 60 MG 24 hr capsule TAKE 1 CAPSULE(60 MG) BY MOUTH DAILY    RABEprazole (ACIPHEX) 20 MG tablet Take one po 30 minutes prior to breakfast every morning.    rosuvastatin (CRESTOR) 5 MG tablet Take 1 tablet (5 mg total) by mouth daily.    SUMAtriptan (IMITREX) 100 MG tablet TAKE 1 TABLET BY MOUTH AT ONSET OF HEADACHE. MAY REPEAT IN 2 HOURS IF HEADACHE PERSISTS    TIROSINT 75 MCG CAPS Take 1 capsule (75 mcg total) by mouth daily before breakfast.    Vitamin D, Ergocalciferol, (DRISDOL) 50000 units CAPS capsule Take 50,000 Units by mouth every Sunday.  06/29/2020: Filled   [DISCONTINUED] SUMAtriptan (IMITREX) 100 MG tablet TAKE 1 TABLET BY MOUTH AT ONSET OF HEADACHE. MAY REPEAT IN 2 HOURS IF HEADACHE PERSISTS (Patient taking  differently: Take 100 mg by mouth every 2 (two) hours as needed for migraine.)    No facility-administered encounter medications on file as of 08/29/2021.    Allergies: Allergies  Allergen Reactions   Arava [Leflunomide] Hives   Azithromycin Other (See Comments)    Severe bloating, abd pain   Cefuroxime Axetil Other (See Comments)    Increased bloating, abd pain   Clarithromycin Other (See Comments)    abd pain   Crestor [Rosuvastatin]     Per patient she had more joint pain   Etanercept Hives and Other (See Comments)    Injection site irritation (ENBREL)   Levofloxacin Hives and Other (See Comments)    Severe dizziness   Sulfa Antibiotics Hives and Other (See Comments)    abd pain   Methotrexate Derivatives Other (See Comments)    Injection ONLY (irritation at  the site of injection)    Family History: Family History  Problem Relation Age of Onset   Thyroid disease Mother    Heart attack Mother    Stroke Mother    Diabetes Mother    Brain cancer Mother    Colon cancer Father    Heart attack Sister    Hypertension Sister    Hypertension Brother     Observations/Objective:   No acute distress.  Alert and oriented.  Speech fluent and not dysarthric.  Language intact.     Follow Up Instructions:    -I discussed the assessment and treatment plan with the patient. The patient was provided an opportunity to ask questions and all were answered. The patient agreed with the plan and demonstrated an understanding of the instructions.   The patient was advised to call back or seek an in-person evaluation if the symptoms worsen or if the condition fails to improve as anticipated.   Dudley Major, DO

## 2021-08-29 ENCOUNTER — Other Ambulatory Visit: Payer: Self-pay

## 2021-08-29 ENCOUNTER — Encounter: Payer: Self-pay | Admitting: Neurology

## 2021-08-29 ENCOUNTER — Telehealth: Payer: BC Managed Care – PPO | Admitting: Neurology

## 2021-08-29 DIAGNOSIS — G43709 Chronic migraine without aura, not intractable, without status migrainosus: Secondary | ICD-10-CM

## 2021-08-29 MED ORDER — BOTOX 200 UNITS IJ SOLR: INTRAMUSCULAR | 4 refills | Status: DC

## 2021-08-29 NOTE — Telephone Encounter (Signed)
New script sent to the pharmacy

## 2021-09-19 NOTE — Telephone Encounter (Signed)
F/u   Your information has been submitted to [place]. To check for an updated outcome later, reopen this PA request from your dashboard.  If [employer] has not responded to your request within 24 hours, contact [place] at [phone]. If you think there may be a problem with your PA request, use our live chat feature at the bottom right.  Elise Benne KeyDonavan Burnet - PA Case ID: [id number] Need help? Call us at [phone] Status Sent to [place] Drug Botox 200UNIT solution Form [employer] Electronic PA Form [id number] NCPDP)

## 2021-09-24 NOTE — Telephone Encounter (Signed)
F/u  Laurie Cherry KeyDonavan Burnet - PA Case ID: [id number] Need help? Call us at [phone]  Outcome Approved on November 23  Your PA request has been approved.  09/19/2021 to  09/19/2022 Additional information will be provided in the approval communication. (Message 1145) Drug Botox 200UNIT solution Form [employer] Electronic PA Form [phone] NCPDP)

## 2021-10-03 ENCOUNTER — Other Ambulatory Visit: Payer: Self-pay | Admitting: Neurology

## 2021-10-30 ENCOUNTER — Ambulatory Visit (INDEPENDENT_AMBULATORY_CARE_PROVIDER_SITE_OTHER): Payer: BC Managed Care – PPO | Admitting: Internal Medicine

## 2021-11-02 ENCOUNTER — Ambulatory Visit: Payer: BC Managed Care – PPO | Admitting: Neurology

## 2021-11-06 ENCOUNTER — Encounter: Payer: Self-pay | Admitting: Neurology

## 2021-11-08 ENCOUNTER — Ambulatory Visit: Payer: BC Managed Care – PPO | Admitting: Neurology

## 2021-11-10 ENCOUNTER — Other Ambulatory Visit: Payer: Self-pay | Admitting: "Endocrinology

## 2021-11-14 ENCOUNTER — Ambulatory Visit: Payer: BC Managed Care – PPO | Admitting: Neurology

## 2021-11-15 ENCOUNTER — Ambulatory Visit: Payer: BC Managed Care – PPO | Admitting: Neurology

## 2021-11-22 ENCOUNTER — Ambulatory Visit: Payer: BC Managed Care – PPO | Admitting: Neurology

## 2021-11-22 ENCOUNTER — Other Ambulatory Visit: Payer: Self-pay

## 2021-11-22 DIAGNOSIS — G43709 Chronic migraine without aura, not intractable, without status migrainosus: Secondary | ICD-10-CM | POA: Diagnosis not present

## 2021-11-22 MED ORDER — ONABOTULINUMTOXINA 100 UNITS IJ SOLR
200.0000 [IU] | Freq: Once | INTRAMUSCULAR | Status: AC
  Administered 2021-11-22: 155 [IU] via INTRAMUSCULAR

## 2021-11-22 NOTE — Progress Notes (Signed)
[facility]  ° °Procedure Note Botox ° °Attending: Dr. Derin Matthes ° °Preoperative Diagnosis(es): Chronic migraine ° °Consent obtained from: The patient °Benefits discussed included, but were not limited to decreased muscle tightness, increased joint range of motion, and decreased pain.  Risk discussed included, but were not limited pain and discomfort, bleeding, bruising, excessive weakness, venous thrombosis, muscle atrophy and dysphagia.  Anticipated outcomes of the procedure as well as he risks and benefits of the alternatives to the procedure, and the roles and tasks of the personnel to be involved, were discussed with the patient, and the patient consents to the procedure and agrees to proceed. A copy of the patient medication guide was given to the patient which explains the blackbox warning. ° °Patients identity and treatment sites confirmed Yes.  . ° °Details of Procedure: °Skin was cleaned with alcohol. Prior to injection, the needle plunger was aspirated to make sure the needle was not within a blood vessel.  There was no blood retrieved on aspiration.   ° °Following is a summary of the muscles injected  And the amount of Botulinum toxin used: ° °Dilution °200 units of Botox was reconstituted with 4 ml of preservative free normal saline. °Time of reconstitution: At the time of the office visit (<30 minutes prior to injection)  ° °Injections  °155 total units of Botox was injected with a 30 gauge needle. ° °Injection Sites: °L occipitalis: 15 units- 3 sites  °R occiptalis: 15 units- 3 sites ° °L upper trapezius: 15 units- 3 sites °R upper trapezius: 15 units- 3 sits          °L paraspinal: 10 units- 2 sites °R paraspinal: 10 units- 2 sites ° °Face °L frontalis(2 injection sites):10 units   °R frontalis(2 injection sites):10 units         °L corrugator: 5 units   °R corrugator: 5 units           °Procerus: 5 units   °L temporalis: 20 units °R temporalis: 20 units  ° °Agent:  °200 units of botulinum Type  A (Onobotulinum Toxin type A) was reconstituted with 4 ml of preservative free normal saline.  °Time of reconstitution: At the time of the office visit (<30 minutes prior to injection)  ° ° ° Total injected (Units):  155 ° Total wasted (Units):  45 ° °Patient tolerated procedure well without complications.   °Reinjection is anticipated in 3 months. ° ° °

## 2021-11-29 ENCOUNTER — Other Ambulatory Visit: Payer: Self-pay | Admitting: "Endocrinology

## 2021-12-03 ENCOUNTER — Other Ambulatory Visit: Payer: Self-pay

## 2021-12-03 ENCOUNTER — Ambulatory Visit (INDEPENDENT_AMBULATORY_CARE_PROVIDER_SITE_OTHER): Payer: BC Managed Care – PPO | Admitting: Gastroenterology

## 2021-12-03 ENCOUNTER — Encounter (INDEPENDENT_AMBULATORY_CARE_PROVIDER_SITE_OTHER): Payer: Self-pay | Admitting: Gastroenterology

## 2021-12-03 VITALS — BP 171/84 | HR 82 | Temp 98.8°F | Ht 62.0 in | Wt 134.0 lb

## 2021-12-03 DIAGNOSIS — K5900 Constipation, unspecified: Secondary | ICD-10-CM

## 2021-12-03 DIAGNOSIS — K2100 Gastro-esophageal reflux disease with esophagitis, without bleeding: Secondary | ICD-10-CM

## 2021-12-03 MED ORDER — RABEPRAZOLE SODIUM 20 MG PO TBEC: 20.0000 mg | DELAYED_RELEASE_TABLET | Freq: Every day | ORAL | 3 refills | Status: DC

## 2021-12-03 NOTE — Patient Instructions (Signed)
Continue aciphex once daily in the mornings, 30-45 minutes prior to eating, refill sent Take Famotidine 10-20mg  in the evenings before bed if you have eaten something heavier, greasy/spicy or that you feel may tend to cause worsening of your symptoms. You can take this nightly if needed, however, let's start with just as needed to attempt to avoid recurrence of headaches from these meds. Please let me know if you do not feel that symptoms are improving with this regimen. Make sure to stay upright 2-3 hours after eating, prior to lying down Be mindful of greasy, spicy, tomato based foods, caffeine, chocolate and alcohol as these can worsen symptoms  You can continue metamucil for your constipation, make sure you are staying well hydrated with atleast eight 8 oz glasses of water, plenty of fruits and veggies, to include prunes and kiwi  Follow up 1 year

## 2021-12-03 NOTE — Progress Notes (Signed)
Referring Provider: Thea Alken, FNP Primary Care Physician:  Thea Alken Primary GI Physician: Laural Golden   Chief Complaint  Patient presents with   Gastroesophageal Reflux    Pt taking aciphex. Thinks med is wearing off too soon.    HPI:   Laurie Cherry is a 61 y.o. female with past medical history of erosive reflux esophagitis and EOE.  Patient presenting today for follow up of GERD.  Patient has been on multiple PPIs in the past but did not tolerate due to headaches. At last visit in Dec 2021, she was switched to lansoprazole 30mg  once daily with famotidine 20mg  nightly x1 month then PRN, thereafter as GERD symptoms were not well managed on cimetidine. Patient contacted the office in feb 2022 stating that prevacid and pepcid worked well to control her GERD symptoms but caused her to have worse headaches, she was started on aciphex 20mg  once daily at that time.   Today, she states that she feels like she is doing pretty well on Aciphex once daily  but she does notice sometimes during the night, we will feel some acid coming back up in her throat, this occurs maybe 2-3 days per week. She can usually take a sip of water which helps and then go back to sleep, she has no heartburn or discomfort with this. She denies sore throat or cough but does feel hoarse upon waking some, she is not sure if this is related to her acid reflux or recent covid infection. She states that she has had no issues with dysphagia since tagamet was stopped a few years back. She sleeps with HOB elevated and stays upright a few hours after dinner, prior to lying down for bed. She denies nausea, vomiting, abdominal pain, rectal bleeding, melena or diarrhea. Is having some constipation which she is taking metamucil for, she feels that this has helped and is having 1 BM per day now.  Last Colonoscopy:04/10/18- The entire examined colon is normal. - Two small anal papillae. - No specimens collected. Last  Endoscopy:04/12/20- Normal hypopharynx. - Normal proximal esophagus. - Esophageal mucosal changes secondary to eosinophilic esophagitis. Biopsied. - Benign-appearing esophageal stenosis. Dilated. - 2 cm hiatal hernia. - Normal stomach. - Normal duodenal bulb and second portion of the duodenum.  Recommendations:  Repeat colonoscopy in 2029  Past Medical History:  Diagnosis Date   Depression    Esophagitis    Family history of adverse reaction to anesthesia    Sister   Fibroids    GERD (gastroesophageal reflux disease)    Hypothyroidism    Rheumatoid arthritis ([place])    Rheumatoid arthritis ([place]) 01/20/2017   Worsening headaches     Past Surgical History:  Procedure Laterality Date   ABDOMINAL HYSTERECTOMY N/A 04/03/2017   Procedure: TOTAL HYSTERECTOMY ABDOMINAL;  Surgeon: Louretta Shorten, MD;  [facility]: [facility];  Service: Gynecology;  Laterality: N/A;   BIOPSY  04/10/2018   Procedure: BIOPSY;  Surgeon: Rogene Houston, MD;  [facility];  Service: Endoscopy;;  esophegus    BIOPSY  10/19/2018   Procedure: BIOPSY;  Surgeon: Rogene Houston, MD;  [facility];  Service: Endoscopy;;  esophageal    BIOPSY  04/12/2020   Procedure: BIOPSY;  Surgeon: Rogene Houston, MD;  [facility];  Service: Endoscopy;;   COLONOSCOPY     COLONOSCOPY     COLONOSCOPY N/A 04/10/2018   Procedure: COLONOSCOPY;  Surgeon: Rogene Houston, MD;  [facility];  Service: Endoscopy;  Laterality: N/A;   ESOPHAGEAL DILATION N/A 04/10/2018   Procedure: ESOPHAGEAL DILATION;  Surgeon: Rogene Houston, MD;  [facility];  Service: Endoscopy;  Laterality: N/A;  balloon dilation   ESOPHAGEAL DILATION N/A 10/19/2018   Procedure: ESOPHAGEAL DILATION;  Surgeon: Rogene Houston, MD;  [facility];  Service: Endoscopy;  Laterality: N/A;   ESOPHAGEAL DILATION N/A 04/12/2020   Procedure: ESOPHAGEAL DILATION;  Surgeon: Rogene Houston, MD;  [facility];  Service: Endoscopy;  Laterality: N/A;   ESOPHAGOGASTRODUODENOSCOPY     ESOPHAGOGASTRODUODENOSCOPY N/A 04/10/2018   Procedure: ESOPHAGOGASTRODUODENOSCOPY (EGD);  Surgeon: Rogene Houston, MD;  [facility];  Service: Endoscopy;  Laterality: N/A;  1:25   ESOPHAGOGASTRODUODENOSCOPY N/A 10/19/2018   Procedure: ESOPHAGOGASTRODUODENOSCOPY (EGD);  Surgeon: Rogene Houston, MD;  [facility];  Service: Endoscopy;  Laterality: N/A;  7:30   ESOPHAGOGASTRODUODENOSCOPY N/A 04/12/2020   Procedure: ESOPHAGOGASTRODUODENOSCOPY (EGD);  Surgeon: Rogene Houston, MD;  [facility];  Service: Endoscopy;  Laterality: N/A;  235   EYE SURGERY Right    SALPINGOOPHORECTOMY Bilateral 04/03/2017   Procedure: SALPINGO OOPHORECTOMY;  Surgeon: Louretta Shorten, MD;  [facility];  Service: Gynecology;  Laterality: Bilateral;    Current Outpatient Medications  Medication Sig Dispense Refill   Adalimumab 40 MG/0.8ML PNKT Inject 40 mg into the skin every 14 (fourteen) days. Every other week on Sunday     APPLE CIDER VINEGAR PO Take 2 capsules by mouth daily.     Botulinum Toxin Type A (BOTOX) 200 units SOLR Inject 155 units IM into the multiple sites in the face neck and head once every 90 days 1 each 4   citalopram (CELEXA) 20 MG tablet Take 10 mg by mouth at bedtime.  3   estradiol (ESTRACE) 0.5 MG tablet Take 0.5 mg by mouth daily.     folic acid (FOLVITE) 1 MG tablet Take 1 mg by mouth daily.      loratadine (CLARITIN) 10 MG tablet Take 10 mg by mouth at bedtime.      methotrexate (RHEUMATREX) 2.5 MG tablet Take 10 mg by mouth once a week. Caution:Chemotherapy. Protect from light.     montelukast (SINGULAIR) 10 MG tablet Take 1 tablet (10 mg total) by mouth at bedtime. 90 tablet 3   Multiple Vitamins-Minerals (IMMUNE SUPPORT PO) Take 1 Scoop by mouth daily.     naproxen (NAPROSYN) 500 MG tablet TAKE 1 TABLET BY MOUTH EVERY 12 HOURS AS NEEDED 20 tablet 5   propranolol ER (INDERAL  LA) 60 MG 24 hr capsule TAKE 1 CAPSULE(60 MG) BY MOUTH DAILY 90 capsule 1   RABEprazole (ACIPHEX) 20 MG tablet Take one po 30 minutes prior to breakfast every morning. 90 tablet 3   rosuvastatin (CRESTOR) 5 MG tablet TAKE 1 TABLET(5 MG) BY MOUTH DAILY 30 tablet 2   SUMAtriptan (IMITREX) 100 MG tablet TAKE 1 TABLET BY MOUTH AT ONSET OF HEADACHE. MAY REPEAT IN 2 HOURS IF HEADACHE PERSISTS 10 tablet 3   TIROSINT 75 MCG CAPS TAKE 1 CAPSULE(75 MCG) BY MOUTH DAILY BEFORE AND BREAKFAST 90 capsule 1   Vitamin D, Ergocalciferol, (DRISDOL) 50000 units CAPS capsule Take 50,000 Units by mouth every Sunday.      No current facility-administered medications for this visit.    Allergies as of 12/03/2021 - Review Complete 08/29/2021  Allergen Reaction Noted   Arava [leflunomide] Hives 10/27/2015   Azithromycin Other (See Comments) 10/27/2015  Cefuroxime axetil Other (See Comments) 10/27/2015   Clarithromycin Other (See Comments) 10/27/2015   Etanercept Hives and Other (See Comments) 10/27/2015   Levofloxacin Hives and Other (See Comments) 10/27/2015   Sulfa antibiotics Hives and Other (See Comments) 10/27/2015   Methotrexate derivatives Other (See Comments) 10/27/2015    Family History  Problem Relation Age of Onset   Thyroid disease Mother    Heart attack Mother    Stroke Mother    Diabetes Mother    Brain cancer Mother    Colon cancer Father    Heart attack Sister    Hypertension Sister    Hypertension Brother     Social History   Socioeconomic History   Marital status: Married    Spouse name: Not on file   Number of children: 1   Years of education: Not on file   Highest education level: Master's degree (e.g., MA, MS, MEng, MEd, MSW, MBA)  Occupational History   Not on file  Tobacco Use   Smoking status: Never   Smokeless tobacco: Never  Vaping Use   Vaping Use: Never used  Substance and Sexual Activity   Alcohol use: No    Alcohol/week: 0.0 standard drinks   Drug use: No    Sexual activity: Not on file  Other Topics Concern   Not on file  Social History Narrative   Pt is married lives with spouse in 2 story home she has 1 children, Master Degree in Education   She is right handed, and drinks coffee once a day, 1 soda a day and not tea   She does not work out at all   Social Determinants of Radio broadcast assistant Strain: Not on file  Food Insecurity: Not on file  Transportation Needs: Not on file  Physical Activity: Not on file  Stress: Not on file  Social Connections: Not on file   Review of systems General: negative for malaise, night sweats, fever, chills, weight loss Neck: Negative for lumps, goiter, pain and significant neck swelling Resp: Negative for cough, wheezing, dyspnea at rest CV: Negative for chest pain, leg swelling, palpitations, orthopnea GI: denies melena, hematochezia, nausea, vomiting, diarrhea, dysphagia, odyonophagia, early satiety or unintentional weight loss. +constipation +acid regurgitation MSK: Negative for joint pain or swelling, back pain, and muscle pain. Derm: Negative for itching or rash Psych: Denies depression, anxiety, memory loss, confusion. No homicidal or suicidal ideation.  Heme: Negative for prolonged bleeding, bruising easily, and swollen nodes. Endocrine: Negative for cold or heat intolerance, polyuria, polydipsia and goiter. Neuro: negative for tremor, gait imbalance, syncope and seizures. The remainder of the review of systems is noncontributory.  Physical Exam: BP (!) 171/84 (BP Location: Left Arm, Patient Position: Sitting, Cuff Size: Normal)    Pulse 82    Temp 98.8 F (37.1 C) (Oral)    Ht 5\' 2"  (1.575 m)    Wt 134 lb (60.8 kg)    LMP 03/31/2017    BMI 24.51 kg/m  General:   Alert and oriented. No distress noted. Pleasant and cooperative.  Head:  Normocephalic and atraumatic. Eyes:  Conjuctiva clear without scleral icterus. Mouth:  Oral mucosa pink and moist. Good dentition. No lesions. Heart:  Normal rate and rhythm, s1 and s2 heart sounds present.  Lungs: Clear lung sounds in all lobes. Respirations equal and unlabored. Abdomen:  +BS, soft, non-tender and non-distended. No rebound or guarding. No HSM or masses noted. Derm: No palmar erythema or jaundice Msk:  Symmetrical without gross deformities.  Normal posture. Extremities:  Without edema. Neurologic:  Alert and  oriented x4 Psych:  Alert and cooperative. Normal mood and affect.  Invalid input(s): 6 MONTHS   ASSESSMENT: KENYATA NAPIER is a 61 y.o. female presenting today for routine follow up of GERD complicated by hx of erosive esophagitis and EOE in the past.  Overall she is doing well on aciphex 20mg  once daily, 2-3x/week she is noticing some acid regurgitation during the night, she reports she usually takes a sip of water and can go back to sleep without issue.  She has no heartburn, dysphagia, abdominal pain, nausea or vomiting. Has some hoarseness upon waking, but this could also be related to recent covid infection or from acid regurgitation. Given her complicated hx with PPI use causing headaches in the past, I am reluctant to increase her aciphex at this time. We discussed trying famotidine 10-20 mg nightly 2-3x/week, especially on evenings that she may have eaten something that could be a trigger for her reflux. She should continue to keep [place] elevated and stay upright atleast 2-3 hours after eating, prior to lying down. She will let me know if symptoms do not improve or she begins to have headaches from this regimen.   As far as her constipation, she is having good result from metamucil, she can continue this, and should make sure she is staying well hydrated, eating plenty of fruits, veggies and whole grains, especially kiwi and prunes as these are great for constipation.    PLAN:  Continue aciphex once daily in the mornings, 30-45 minutes prior to eating, refill sent 2. Famotidine 20mg  in the evenings, 2-3x/week or  when possible trigger food has been consumed 3. Stay upright 2-3 hours after eating, prior to lying down 4. Be mindful of greasy, spicy, tomato based foods, caffiene, chocolate and alcohol as these can worsen symptoms 5. Stay well hydrated, eat plenty of fruits and veggies for constipation   Follow Up: 1 year  Laurie Moronta L. Alver Sorrow, MSN, APRN, AGNP-C Adult-Gerontology Nurse Practitioner [facility]

## 2022-01-01 ENCOUNTER — Telehealth: Payer: Self-pay | Admitting: "Endocrinology

## 2022-01-01 DIAGNOSIS — E039 Hypothyroidism, unspecified: Secondary | ICD-10-CM

## 2022-01-01 DIAGNOSIS — E782 Mixed hyperlipidemia: Secondary | ICD-10-CM

## 2022-01-01 NOTE — Telephone Encounter (Signed)
Patient said that her hair is shedding like crazy ever since her last appt. She wants to know can she do her labs now instead of waiting until April so that you can see where her levels are. ?

## 2022-01-01 NOTE — Telephone Encounter (Signed)
Patient made aware.

## 2022-01-02 LAB — LIPID PANEL
Chol/HDL Ratio: 4.1 ratio (ref 0.0–4.4)
Cholesterol, Total: 224 mg/dL — ABNORMAL HIGH (ref 100–199)
HDL: 55 mg/dL (ref >39)
LDL Chol Calc (NIH): 141 mg/dL — ABNORMAL HIGH (ref 0–99)
Triglycerides: 158 mg/dL — ABNORMAL HIGH (ref 0–149)
VLDL Cholesterol Cal: 28 mg/dL (ref 5–40)

## 2022-01-02 LAB — T4, FREE: Free T4: 1.79 ng/dL — ABNORMAL HIGH (ref 0.82–1.77)

## 2022-01-02 LAB — TSH: TSH: 0.408 u[IU]/mL — ABNORMAL LOW (ref 0.450–4.500)

## 2022-01-02 NOTE — Telephone Encounter (Signed)
Patient also said that her LIPID was not fasting because she tried to get the lab to only do her thyroid labs. She wanted you to know.  ?

## 2022-01-02 NOTE — Telephone Encounter (Signed)
Patient left a Vm stating she completed her blood work. Can you review that in the chart and advise if she needs a med change? Also, can you place new lab orders for her next visit because she used the ones she already had. Thank you! ?

## 2022-01-03 ENCOUNTER — Encounter: Payer: Self-pay | Admitting: "Endocrinology

## 2022-01-03 ENCOUNTER — Other Ambulatory Visit: Payer: Self-pay | Admitting: "Endocrinology

## 2022-01-03 MED ORDER — TIROSINT 50 MCG PO CAPS: 50.0000 ug | ORAL_CAPSULE | Freq: Every day | ORAL | 2 refills | Status: DC

## 2022-01-03 NOTE — Addendum Note (Signed)
Addended by: Ellin Saba on: 01/03/2022 11:32 AM ? ? Modules accepted: Orders ? ?

## 2022-01-03 NOTE — Telephone Encounter (Signed)
Pt said she is confused because when she looked at her results, one was high and one was low, she thought it needed to be increased due to hair shedding, more constipation and not being able to lose weight. Can you place new orders for next visit, please. Thank you ?

## 2022-01-03 NOTE — Telephone Encounter (Signed)
Discussed with pt, understanding voiced. Lab orders updated and sent to [facility]. ?

## 2022-01-10 ENCOUNTER — Other Ambulatory Visit (INDEPENDENT_AMBULATORY_CARE_PROVIDER_SITE_OTHER): Payer: Self-pay | Admitting: Gastroenterology

## 2022-01-10 NOTE — Telephone Encounter (Signed)
12/03/2021 last seen by [facility]. ?

## 2022-01-26 ENCOUNTER — Other Ambulatory Visit: Payer: Self-pay | Admitting: Neurology

## 2022-01-26 DIAGNOSIS — G43009 Migraine without aura, not intractable, without status migrainosus: Secondary | ICD-10-CM

## 2022-01-31 NOTE — Progress Notes (Signed)
Fax received from [place] delivery date set for 02/07/22 ?

## 2022-02-08 ENCOUNTER — Ambulatory Visit: Payer: BC Managed Care – PPO | Admitting: Neurology

## 2022-02-09 ENCOUNTER — Other Ambulatory Visit: Payer: Self-pay | Admitting: "Endocrinology

## 2022-02-09 ENCOUNTER — Other Ambulatory Visit (INDEPENDENT_AMBULATORY_CARE_PROVIDER_SITE_OTHER): Payer: Self-pay | Admitting: Internal Medicine

## 2022-02-09 DIAGNOSIS — K2100 Gastro-esophageal reflux disease with esophagitis, without bleeding: Secondary | ICD-10-CM

## 2022-02-13 ENCOUNTER — Ambulatory Visit: Payer: BC Managed Care – PPO | Admitting: "Endocrinology

## 2022-02-15 ENCOUNTER — Ambulatory Visit: Payer: BC Managed Care – PPO | Admitting: Neurology

## 2022-02-20 ENCOUNTER — Ambulatory Visit: Payer: BC Managed Care – PPO | Admitting: "Endocrinology

## 2022-02-20 ENCOUNTER — Encounter: Payer: Self-pay | Admitting: "Endocrinology

## 2022-02-20 VITALS — BP 136/80 | HR 72 | Ht 62.0 in | Wt 136.0 lb

## 2022-02-20 DIAGNOSIS — E782 Mixed hyperlipidemia: Secondary | ICD-10-CM

## 2022-02-20 DIAGNOSIS — E039 Hypothyroidism, unspecified: Secondary | ICD-10-CM

## 2022-02-20 LAB — LIPID PANEL
Chol/HDL Ratio: 4.1 ratio (ref 0.0–4.4)
Cholesterol, Total: 215 mg/dL — ABNORMAL HIGH (ref 100–199)
HDL: 52 mg/dL (ref >39)
LDL Chol Calc (NIH): 136 mg/dL — ABNORMAL HIGH (ref 0–99)
Triglycerides: 154 mg/dL — ABNORMAL HIGH (ref 0–149)
VLDL Cholesterol Cal: 27 mg/dL (ref 5–40)

## 2022-02-20 LAB — TSH: TSH: 9.87 u[IU]/mL — ABNORMAL HIGH (ref 0.450–4.500)

## 2022-02-20 LAB — T4, FREE: Free T4: 1.4 ng/dL (ref 0.82–1.77)

## 2022-02-20 MED ORDER — TIROSINT 50 MCG PO CAPS: ORAL_CAPSULE | ORAL | 1 refills | Status: DC

## 2022-02-20 NOTE — Progress Notes (Signed)
? ?02/20/2022 ? ?Endocrinology follow-up note ? ? ?Subjective:  ? ? Patient ID: Laurie Cherry, female    DOB: [birth year], PCP Ephriam Jenkins E ? ? ?Past Medical History:  ?Diagnosis Date  ? Depression   ? Esophagitis   ? Family history of adverse reaction to anesthesia   ? Sister  ? Fibroids   ? GERD (gastroesophageal reflux disease)   ? Hypothyroidism   ? Rheumatoid arthritis ([place])   ? Rheumatoid arthritis ([place]) 01/20/2017  ? Worsening headaches   ? ?Past Surgical History:  ?Procedure Laterality Date  ? ABDOMINAL HYSTERECTOMY N/A 04/03/2017  ? Procedure: TOTAL HYSTERECTOMY ABDOMINAL;  Surgeon: Louretta Shorten, MD;  [facility];  Service: Gynecology;  Laterality: N/A;  ? BIOPSY  04/10/2018  ? Procedure: BIOPSY;  Surgeon: Rogene Houston, MD;  [facility];  Service: Endoscopy;;  esophegus ?  ? BIOPSY  10/19/2018  ? Procedure: BIOPSY;  Surgeon: Rogene Houston, MD;  [facility];  Service: Endoscopy;;  esophageal ?  ? BIOPSY  04/12/2020  ? Procedure: BIOPSY;  Surgeon: Rogene Houston, MD;  [facility];  Service: Endoscopy;;  ? COLONOSCOPY    ? COLONOSCOPY    ? COLONOSCOPY N/A 04/10/2018  ? Procedure: COLONOSCOPY;  Surgeon: Rogene Houston, MD;  [facility];  Service: Endoscopy;  Laterality: N/A;  ? ESOPHAGEAL DILATION N/A 04/10/2018  ? Procedure: ESOPHAGEAL DILATION;  Surgeon: Rogene Houston, MD;  [facility];  Service: Endoscopy;  Laterality: N/A;  balloon dilation  ? ESOPHAGEAL DILATION N/A 10/19/2018  ? Procedure: ESOPHAGEAL DILATION;  Surgeon: Rogene Houston, MD;  [facility];  Service: Endoscopy;  Laterality: N/A;  ? ESOPHAGEAL DILATION N/A 04/12/2020  ? Procedure: ESOPHAGEAL DILATION;  Surgeon: Rogene Houston, MD;  [facility];  Service: Endoscopy;  Laterality: N/A;  ? ESOPHAGOGASTRODUODENOSCOPY    ? ESOPHAGOGASTRODUODENOSCOPY N/A 04/10/2018  ? Procedure: ESOPHAGOGASTRODUODENOSCOPY (EGD);  Surgeon: Rogene Houston, MD;   [facility];  Service: Endoscopy;  Laterality: N/A;  1:25  ? ESOPHAGOGASTRODUODENOSCOPY N/A 10/19/2018  ? Procedure: ESOPHAGOGASTRODUODENOSCOPY (EGD);  Surgeon: Rogene Houston, MD;  [facility];  Service: Endoscopy;  Laterality: N/A;  7:30  ? ESOPHAGOGASTRODUODENOSCOPY N/A 04/12/2020  ? Procedure: ESOPHAGOGASTRODUODENOSCOPY (EGD);  Surgeon: Rogene Houston, MD;  [facility];  Service: Endoscopy;  Laterality: N/A;  235  ? EYE SURGERY Right   ? SALPINGOOPHORECTOMY Bilateral 04/03/2017  ? Procedure: SALPINGO OOPHORECTOMY;  Surgeon: Louretta Shorten, MD;  [facility];  Service: Gynecology;  Laterality: Bilateral;  ? ?Social History  ? ?Socioeconomic History  ? Marital status: Married  ?  Spouse name: Not on file  ? Number of children: 1  ? Years of education: Not on file  ? Highest education level: Master's degree (e.g., MA, MS, MEng, MEd, MSW, MBA)  ?Occupational History  ? Not on file  ?Tobacco Use  ? Smoking status: Never  ? Smokeless tobacco: Never  ?Vaping Use  ? Vaping Use: Never used  ?Substance and Sexual Activity  ? Alcohol use: No  ?  Alcohol/week: 0.0 standard drinks  ? Drug use: No  ? Sexual activity: Not on file  ?Other Topics Concern  ? Not on file  ?Social History Narrative  ? Pt is married lives with spouse in 2 story home she has 1 children, Master Degree in Education  ? She is right handed, and drinks coffee once a day, 1  soda a day and not tea  ? She does not work out at all  ? ?Social Determinants of Health  ? ?Financial Resource Strain: Not on file  ?Food Insecurity: Not on file  ?Transportation Needs: Not on file  ?Physical Activity: Not on file  ?Stress: Not on file  ?Social Connections: Not on file  ? ?Outpatient Encounter Medications as of 02/20/2022  ?Medication Sig  ? Adalimumab 40 MG/0.8ML PNKT Inject 40 mg into the skin every 14 (fourteen) days. Every other week on Sunday  ? APPLE CIDER VINEGAR PO Take 2 capsules by mouth daily.  ? Botulinum Toxin Type A  (BOTOX) 200 units SOLR Inject 155 units IM into the multiple sites in the face neck and head once every 90 days  ? citalopram (CELEXA) 20 MG tablet Take 10 mg by mouth at bedtime.  ? estradiol (ESTRACE) 0.5 MG tablet Take 0.5 mg by mouth daily.  ? folic acid (FOLVITE) 1 MG tablet Take 1 mg by mouth daily.   ? loratadine (CLARITIN) 10 MG tablet Take 10 mg by mouth at bedtime.   ? methotrexate (RHEUMATREX) 2.5 MG tablet Take 10 mg by mouth once a week. Caution:Chemotherapy. Protect from light.  ? montelukast (SINGULAIR) 10 MG tablet TAKE 1 TABLET(10 MG) BY MOUTH AT BEDTIME  ? Multiple Vitamins-Minerals (IMMUNE SUPPORT PO) Take 1 Scoop by mouth daily.  ? naproxen (NAPROSYN) 500 MG tablet TAKE 1 TABLET BY MOUTH EVERY 12 HOURS AS NEEDED  ? propranolol ER (INDERAL LA) 60 MG 24 hr capsule TAKE 1 CAPSULE(60 MG) BY MOUTH DAILY  ? RABEprazole (ACIPHEX) 20 MG tablet TAKE 1 TABLET BY MOUTH EVERY MORNING 30 MINUTES BEFORE BREAKFAST  ? rosuvastatin (CRESTOR) 5 MG tablet TAKE 1 TABLET(5 MG) BY MOUTH DAILY  ? SUMAtriptan (IMITREX) 100 MG tablet TAKE 1 TABLET BY MOUTH AT ONSET OF HEADACHE. MAY REPEAT IN 2 HOURS IF HEADACHE PERSISTS  ? TIROSINT 50 MCG CAPS Use on alternate days before breakfast along with 75 mcg on the other alternate days  ? Vitamin D, Ergocalciferol, (DRISDOL) 50000 units CAPS capsule Take 50,000 Units by mouth every Sunday.   ? [DISCONTINUED] TIROSINT 50 MCG CAPS Take 1 capsule (50 mcg total) by mouth daily before breakfast.  ? ?No facility-administered encounter medications on file as of 02/20/2022.  ? ?ALLERGIES: ?Allergies  ?Allergen Reactions  ? Arava [Leflunomide] Hives  ? Azithromycin Other (See Comments)  ?  Severe bloating, abd pain  ? Cefuroxime Axetil Other (See Comments)  ?  Increased bloating, abd pain  ? Clarithromycin Other (See Comments)  ?  abd pain  ? Etanercept Hives and Other (See Comments)  ?  Injection site irritation (ENBREL)  ? Levofloxacin Hives and Other (See Comments)  ?  Severe dizziness   ? Sulfa Antibiotics Hives and Other (See Comments)  ?  abd pain  ? Methotrexate Derivatives Other (See Comments)  ?  Injection ONLY (irritation at the site of injection)  ? ?VACCINATION STATUS: ? ?There is no immunization history on file for this patient. ? ?HPI ? ?61 year old female patient with medical history as above.  She is being seen in follow-up for hypothyroidism.,  Hyperlipidemia. ? She states that she was diagnosed with hypothyroidism approximately at age 15. ?-She does not tolerate levothyroxine/Synthroid.  She has been on Tirosint 50 mcg p.o. daily .  He was recently lowered from 75 mcg to 2 iatrogenic thyrotoxicosis.   ? ?She still does not tolerate Crestor.  She takes half of 5 mg tablet  every other day.  Her recent lipid panel showed significant dyslipidemia.  ? She does not tolerate appropriate dose statins.  Her original did not provide coverage for Repatha.   ? ?- She has family  history of thyroid dysfunction in multiple members of her family.  ?She denies any history of goiter. She denies any family history of thyroid cancer. ?- She underwent total abdominal hysterectomy/ salphigo oophorectomy due to a large ovarian cyst. Findings were reportedly benign. ? ?-She is on multiple medications for her rheumatoid arthritis.  ? ?Review of Systems ?Limited as above. ? ?Objective:  ?  ?BP 136/80   Pulse 72   Ht '5\' 2"'$  (1.575 m)   Wt 136 lb (61.7 kg)   LMP 03/31/2017   BMI 24.87 kg/m?   ?Wt Readings from Last 3 Encounters:  ?02/20/22 136 lb (61.7 kg)  ?12/03/21 134 lb (60.8 kg)  ?08/14/21 134 lb 9.6 oz (61.1 kg)  ?  ?Physical Exam ? ? ? ?Recent Results (from the past 2160 hour(s))  ?TSH     Status: Abnormal  ? Collection Time: 01/01/22  4:48 PM  ?Result Value Ref Range  ? TSH 0.408 (L) 0.450 - 4.500 uIU/mL  ?T4, free     Status: Abnormal  ? Collection Time: 01/01/22  4:48 PM  ?Result Value Ref Range  ? Free T4 1.79 (H) 0.82 - 1.77 ng/dL  ?Lipid panel     Status: Abnormal  ? Collection Time: 01/01/22   4:48 PM  ?Result Value Ref Range  ? Cholesterol, Total 224 (H) 100 - 199 mg/dL  ? Triglycerides 158 (H) 0 - 149 mg/dL  ? HDL 55 >39 mg/dL  ? VLDL Cholesterol Cal 28 5 - 40 mg/dL  ? LDL Chol Calc (NIH) 141 (H)

## 2022-02-21 ENCOUNTER — Ambulatory Visit: Payer: BC Managed Care – PPO | Admitting: Neurology

## 2022-02-21 ENCOUNTER — Other Ambulatory Visit: Payer: Self-pay | Admitting: "Endocrinology

## 2022-02-21 DIAGNOSIS — E2749 Other adrenocortical insufficiency: Secondary | ICD-10-CM | POA: Insufficient documentation

## 2022-02-21 DIAGNOSIS — G43709 Chronic migraine without aura, not intractable, without status migrainosus: Secondary | ICD-10-CM | POA: Diagnosis not present

## 2022-02-21 MED ORDER — ONABOTULINUMTOXINA 100 UNITS IJ SOLR
200.0000 [IU] | Freq: Once | INTRAMUSCULAR | Status: AC
  Administered 2022-02-21: 155 [IU] via INTRAMUSCULAR

## 2022-02-21 NOTE — Progress Notes (Signed)
[facility]  ° °Procedure Note Botox ° °Attending: Dr. Coralee Edberg ° °Preoperative Diagnosis(es): Chronic migraine ° °Consent obtained from: The patient °Benefits discussed included, but were not limited to decreased muscle tightness, increased joint range of motion, and decreased pain.  Risk discussed included, but were not limited pain and discomfort, bleeding, bruising, excessive weakness, venous thrombosis, muscle atrophy and dysphagia.  Anticipated outcomes of the procedure as well as he risks and benefits of the alternatives to the procedure, and the roles and tasks of the personnel to be involved, were discussed with the patient, and the patient consents to the procedure and agrees to proceed. A copy of the patient medication guide was given to the patient which explains the blackbox warning. ° °Patients identity and treatment sites confirmed Yes.  . ° °Details of Procedure: °Skin was cleaned with alcohol. Prior to injection, the needle plunger was aspirated to make sure the needle was not within a blood vessel.  There was no blood retrieved on aspiration.   ° °Following is a summary of the muscles injected  And the amount of Botulinum toxin used: ° °Dilution °200 units of Botox was reconstituted with 4 ml of preservative free normal saline. °Time of reconstitution: At the time of the office visit (<30 minutes prior to injection)  ° °Injections  °155 total units of Botox was injected with a 30 gauge needle. ° °Injection Sites: °L occipitalis: 15 units- 3 sites  °R occiptalis: 15 units- 3 sites ° °L upper trapezius: 15 units- 3 sites °R upper trapezius: 15 units- 3 sits          °L paraspinal: 10 units- 2 sites °R paraspinal: 10 units- 2 sites ° °Face °L frontalis(2 injection sites):10 units   °R frontalis(2 injection sites):10 units         °L corrugator: 5 units   °R corrugator: 5 units           °Procerus: 5 units   °L temporalis: 20 units °R temporalis: 20 units  ° °Agent:  °200 units of botulinum Type  A (Onobotulinum Toxin type A) was reconstituted with 4 ml of preservative free normal saline.  °Time of reconstitution: At the time of the office visit (<30 minutes prior to injection)  ° ° ° Total injected (Units):  155 ° Total wasted (Units):  45 ° °Patient tolerated procedure well without complications.   °Reinjection is anticipated in 3 months. ° ° °

## 2022-02-21 NOTE — Telephone Encounter (Signed)
Patient called back, I made her aware that it came through and it was placed in nurse box. Please advise with pt if she needs to do anything. ?

## 2022-02-21 NOTE — Telephone Encounter (Signed)
Pt is calling and would like to know if you have received those results yet.  ?

## 2022-03-04 ENCOUNTER — Other Ambulatory Visit: Payer: Self-pay | Admitting: "Endocrinology

## 2022-03-04 MED ORDER — DEXAMETHASONE 0.5 MG PO TABS: 0.5000 mg | ORAL_TABLET | Freq: Every day | ORAL | 0 refills | Status: DC

## 2022-03-21 ENCOUNTER — Other Ambulatory Visit (HOSPITAL_COMMUNITY): Payer: Self-pay | Admitting: *Deleted

## 2022-03-21 DIAGNOSIS — E2749 Other adrenocortical insufficiency: Secondary | ICD-10-CM

## 2022-03-22 ENCOUNTER — Ambulatory Visit (HOSPITAL_COMMUNITY)
Admission: RE | Admit: 2022-03-22 | Discharge: 2022-03-22 | Disposition: A | Discharge status: Home / Self Care | Payer: BC Managed Care – PPO | Source: Ambulatory Visit | Attending: "Endocrinology | Admitting: "Endocrinology

## 2022-03-22 DIAGNOSIS — E2749 Other adrenocortical insufficiency: Secondary | ICD-10-CM | POA: Diagnosis present

## 2022-03-22 LAB — ACTH STIMULATION, 3 TIME POINTS
Cortisol, 30 Min: 1.8 ug/dL
Cortisol, 60 Min: 2.4 ug/dL
Cortisol, Base: 0.4 ug/dL

## 2022-03-22 MED ORDER — COSYNTROPIN 0.25 MG IJ SOLR
0.2500 mg | Freq: Once | INTRAMUSCULAR | Status: AC
  Administered 2022-03-22: 0.25 mg via INTRAMUSCULAR

## 2022-03-22 MED ORDER — COSYNTROPIN 0.25 MG IJ SOLR
INTRAMUSCULAR | Status: AC
  Filled 2022-03-22: qty 0.25

## 2022-03-22 NOTE — Progress Notes (Signed)
Labs drawn and cosyntropin given as ordered. Labs walked to lab and given to [employer].

## 2022-03-26 ENCOUNTER — Ambulatory Visit: Payer: BC Managed Care – PPO | Admitting: "Endocrinology

## 2022-03-26 ENCOUNTER — Encounter: Payer: Self-pay | Admitting: "Endocrinology

## 2022-03-26 VITALS — BP 130/78 | HR 76 | Ht 62.0 in | Wt 135.2 lb

## 2022-03-26 DIAGNOSIS — E039 Hypothyroidism, unspecified: Secondary | ICD-10-CM

## 2022-03-26 DIAGNOSIS — E2740 Unspecified adrenocortical insufficiency: Secondary | ICD-10-CM

## 2022-03-26 DIAGNOSIS — E782 Mixed hyperlipidemia: Secondary | ICD-10-CM | POA: Diagnosis not present

## 2022-03-26 MED ORDER — HYDROCORTISONE 5 MG PO TABS
ORAL_TABLET | ORAL | 2 refills | Status: DC
Start: 2022-03-26 — End: 2022-07-04

## 2022-03-26 NOTE — Progress Notes (Signed)
03/26/2022  Endocrinology follow-up note   Subjective:    Patient ID: Laurie Cherry, female    DOB: [birth year], PCP Ephriam Jenkins E   Past Medical History:  Diagnosis Date   Depression    Esophagitis    Family history of adverse reaction to anesthesia    Sister   Fibroids    GERD (gastroesophageal reflux disease)    Hypothyroidism    Rheumatoid arthritis ([place])    Rheumatoid arthritis ([place]) 01/20/2017   Worsening headaches    Past Surgical History:  Procedure Laterality Date   ABDOMINAL HYSTERECTOMY N/A 04/03/2017   Procedure: TOTAL HYSTERECTOMY ABDOMINAL;  Surgeon: Louretta Shorten, MD;  Location: [facility];  Service: Gynecology;  Laterality: N/A;   BIOPSY  04/10/2018   Procedure: BIOPSY;  Surgeon: Rogene Houston, MD;  [facility]: [facility];  Service: Endoscopy;;  esophegus    BIOPSY  10/19/2018   Procedure: BIOPSY;  Surgeon: Rogene Houston, MD;  [facility];  Service: Endoscopy;;  esophageal    BIOPSY  04/12/2020   Procedure: BIOPSY;  Surgeon: Rogene Houston, MD;  [facility];  Service: Endoscopy;;   COLONOSCOPY     COLONOSCOPY     COLONOSCOPY N/A 04/10/2018   Procedure: COLONOSCOPY;  Surgeon: Rogene Houston, MD;  [facility];  Service: Endoscopy;  Laterality: N/A;   ESOPHAGEAL DILATION N/A 04/10/2018   Procedure: ESOPHAGEAL DILATION;  Surgeon: Rogene Houston, MD;  [facility];  Service: Endoscopy;  Laterality: N/A;  balloon dilation   ESOPHAGEAL DILATION N/A 10/19/2018   Procedure: ESOPHAGEAL DILATION;  Surgeon: Rogene Houston, MD;  [facility];  Service: Endoscopy;  Laterality: N/A;   ESOPHAGEAL DILATION N/A 04/12/2020   Procedure: ESOPHAGEAL DILATION;  Surgeon: Rogene Houston, MD;  [facility];  Service: Endoscopy;  Laterality: N/A;   ESOPHAGOGASTRODUODENOSCOPY     ESOPHAGOGASTRODUODENOSCOPY N/A 04/10/2018   Procedure: ESOPHAGOGASTRODUODENOSCOPY (EGD);  Surgeon: Rogene Houston, MD;   [facility];  Service: Endoscopy;  Laterality: N/A;  1:25   ESOPHAGOGASTRODUODENOSCOPY N/A 10/19/2018   Procedure: ESOPHAGOGASTRODUODENOSCOPY (EGD);  Surgeon: Rogene Houston, MD;  [facility];  Service: Endoscopy;  Laterality: N/A;  7:30   ESOPHAGOGASTRODUODENOSCOPY N/A 04/12/2020   Procedure: ESOPHAGOGASTRODUODENOSCOPY (EGD);  Surgeon: Rogene Houston, MD;  [facility];  Service: Endoscopy;  Laterality: N/A;  235   EYE SURGERY Right    SALPINGOOPHORECTOMY Bilateral 04/03/2017   Procedure: SALPINGO OOPHORECTOMY;  Surgeon: Louretta Shorten, MD;  Location: [facility];  Service: Gynecology;  Laterality: Bilateral;   Social History   Socioeconomic History   Marital status: Married    Spouse name: Not on file   Number of children: 1   Years of education: Not on file   Highest education level: Master's degree (e.g., MA, MS, MEng, MEd, MSW, MBA)  Occupational History   Not on file  Tobacco Use   Smoking status: Never   Smokeless tobacco: Never  Vaping Use   Vaping Use: Never used  Substance and Sexual Activity   Alcohol use: No    Alcohol/week: 0.0 standard drinks   Drug use: No   Sexual activity: Not on file  Other Topics Concern   Not on file  Social History Narrative   Pt is married lives with spouse in 2 story home she has 1 children, Master Degree in Education   She is right handed, and drinks coffee once a day, 1  soda a day and not tea   She does not work out at all   Social Determinants of Radio broadcast assistant Strain: Not on file  Food Insecurity: Not on file  Transportation Needs: Not on file  Physical Activity: Not on file  Stress: Not on file  Social Connections: Not on file   Outpatient Encounter Medications as of 03/26/2022  Medication Sig   hydrocortisone (CORTEF) 5 MG tablet Take 10 mg ( 2 tabs of 5 mg ) daily at 8AM  and 5 mg ( 1 tabs of 5 mg)  daily at noon   Adalimumab 40 MG/0.8ML PNKT Inject 40 mg into the skin every 14  (fourteen) days. Every other week on Sunday   APPLE CIDER VINEGAR PO Take 2 capsules by mouth daily.   Botulinum Toxin Type A (BOTOX) 200 units SOLR Inject 155 units IM into the multiple sites in the face neck and head once every 90 days   citalopram (CELEXA) 20 MG tablet Take 10 mg by mouth at bedtime.   estradiol (ESTRACE) 0.5 MG tablet Take 0.5 mg by mouth daily.   folic acid (FOLVITE) 1 MG tablet Take 1 mg by mouth daily.    loratadine (CLARITIN) 10 MG tablet Take 10 mg by mouth at bedtime.    methotrexate (RHEUMATREX) 2.5 MG tablet Take 10 mg by mouth once a week. Caution:Chemotherapy. Protect from light.   montelukast (SINGULAIR) 10 MG tablet TAKE 1 TABLET(10 MG) BY MOUTH AT BEDTIME   Multiple Vitamins-Minerals (IMMUNE SUPPORT PO) Take 1 Scoop by mouth daily.   naproxen (NAPROSYN) 500 MG tablet TAKE 1 TABLET BY MOUTH EVERY 12 HOURS AS NEEDED   propranolol ER (INDERAL LA) 60 MG 24 hr capsule TAKE 1 CAPSULE(60 MG) BY MOUTH DAILY   RABEprazole (ACIPHEX) 20 MG tablet TAKE 1 TABLET BY MOUTH EVERY MORNING 30 MINUTES BEFORE BREAKFAST   rosuvastatin (CRESTOR) 5 MG tablet TAKE 1 TABLET(5 MG) BY MOUTH DAILY   SUMAtriptan (IMITREX) 100 MG tablet TAKE 1 TABLET BY MOUTH AT ONSET OF HEADACHE. MAY REPEAT IN 2 HOURS IF HEADACHE PERSISTS   TIROSINT 50 MCG CAPS Use on alternate days before breakfast along with 75 mcg on the other alternate days   Vitamin D, Ergocalciferol, (DRISDOL) 50000 units CAPS capsule Take 50,000 Units by mouth every Sunday.    [DISCONTINUED] dexamethasone (DECADRON) 0.5 MG tablet Take 1 tablet (0.5 mg total) by mouth daily with breakfast.   No facility-administered encounter medications on file as of 03/26/2022.   ALLERGIES: Allergies  Allergen Reactions   Arava [Leflunomide] Hives   Azithromycin Other (See Comments)    Severe bloating, abd pain   Cefuroxime Axetil Other (See Comments)    Increased bloating, abd pain   Clarithromycin Other (See Comments)    abd pain    Etanercept Hives and Other (See Comments)    Injection site irritation (ENBREL)   Levofloxacin Hives and Other (See Comments)    Severe dizziness   Sulfa Antibiotics Hives and Other (See Comments)    abd pain   Methotrexate Derivatives Other (See Comments)    Injection ONLY (irritation at the site of injection)   VACCINATION STATUS:  There is no immunization history on file for this patient.  HPI  61 year old female patient with medical history as above.  She is being seen in follow-up for hypothyroidism.,  Hyperlipidemia. This visit is due to her recent adrenal assessment which confirms adrenal insufficiency.  See lab results below.  Patient has significant exposure  to steroids over the years due to her autoimmune conditions. She was started on bridging dexamethasone treatment while undergoing ACTH stimulation test.  She states that she was diagnosed with hypothyroidism approximately at age 56. -Her Tirosint was recently adjusted at 50 mcg p.o. daily before breakfast.  She has no new complaints.    She still does not tolerate Crestor.  She takes half of 5 mg tablet every other day.  Her recent lipid panel showed significant dyslipidemia.   She does not tolerate appropriate dose statins.  Her original did not provide coverage for Repatha.    - She has family  history of thyroid dysfunction in multiple members of her family.  She denies any history of goiter. She denies any family history of thyroid cancer. - She underwent total abdominal hysterectomy/ salphigo oophorectomy due to a large ovarian cyst. Findings were reportedly benign.  -She is on multiple medications for her rheumatoid arthritis.   Review of Systems Limited as above.  Objective:    BP 130/78   Pulse 76   Ht '5\' 2"'$  (1.575 m)   Wt 135 lb 3.2 oz (61.3 kg)   LMP 03/31/2017   BMI 24.73 kg/m   Wt Readings from Last 3 Encounters:  03/26/22 135 lb 3.2 oz (61.3 kg)  02/20/22 136 lb (61.7 kg)  12/03/21 134 lb (60.8  kg)    Physical Exam    Recent Results (from the past 2160 hour(s))  TSH     Status: Abnormal   Collection Time: 01/01/22  4:48 PM  Result Value Ref Range   TSH 0.408 (L) 0.450 - 4.500 uIU/mL  T4, free     Status: Abnormal   Collection Time: 01/01/22  4:48 PM  Result Value Ref Range   Free T4 1.79 (H) 0.82 - 1.77 ng/dL  Lipid panel     Status: Abnormal   Collection Time: 01/01/22  4:48 PM  Result Value Ref Range   Cholesterol, Total 224 (H) 100 - 199 mg/dL   Triglycerides 158 (H) 0 - 149 mg/dL   HDL 55 >39 mg/dL   VLDL Cholesterol Cal 28 5 - 40 mg/dL   LDL Chol Calc (NIH) 141 (H) 0 - 99 mg/dL   Chol/HDL Ratio 4.1 0.0 - 4.4 ratio    Comment:                                   T. Chol/HDL Ratio                                             Men  Women                               1/2 Avg.Risk  3.4    3.3                                   Avg.Risk  5.0    4.4                                2X Avg.Risk  9.6    7.1  3X Avg.Risk 23.4   11.0   Lipid Panel     Status: Abnormal   Collection Time: 02/19/22  3:55 PM  Result Value Ref Range   Cholesterol, Total 215 (H) 100 - 199 mg/dL   Triglycerides 154 (H) 0 - 149 mg/dL   HDL 52 >39 mg/dL   VLDL Cholesterol Cal 27 5 - 40 mg/dL   LDL Chol Calc (NIH) 136 (H) 0 - 99 mg/dL   Chol/HDL Ratio 4.1 0.0 - 4.4 ratio    Comment:                                   T. Chol/HDL Ratio                                             Men  Women                               1/2 Avg.Risk  3.4    3.3                                   Avg.Risk  5.0    4.4                                2X Avg.Risk  9.6    7.1                                3X Avg.Risk 23.4   11.0   T4, Free     Status: None   Collection Time: 02/19/22  3:55 PM  Result Value Ref Range   Free T4 1.40 0.82 - 1.77 ng/dL  TSH     Status: Abnormal   Collection Time: 02/19/22  3:55 PM  Result Value Ref Range   TSH 9.870 (H) 0.450 - 4.500 uIU/mL  ACTH  stimulation, 3 time points (Cortisol base, 30, 60 min)     Status: None   Collection Time: 03/22/22  9:35 AM  Result Value Ref Range   Cortisol, Base 0.4 ug/dL    Comment: NO NORMAL RANGE ESTABLISHED FOR THIS TEST   Cortisol, 30 Min 1.8 ug/dL   Cortisol, 60 Min 2.4 ug/dL    Comment: Performed at [facility], [place] [address], [place], [place] [address]    - On 08/16/2016 her free T4 was 1.36, improving from prior studies. - On 05/09/2016: Free T4 0.63, TSH 7.58, TPO antibodies 13  Assessment & Plan:   Adrenal insufficiency 2.  Hypothyroidism  3.  Dyslipidemia I reviewed her recent labs assessing adrenal insufficiency.  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  Unlikely to be secondary to Addison's disease.  She would benefit from initiating glucocorticoid replacement.  I discussed and started her on hydrocortisone 10 mg p.o. daily at 8 AM, and 5 mg p.o. daily at noon.  She is advised to discontinue dexamethasone.  Sick day rules discussed with her.  She will likely need glucocorticoid replacement for life.  She will wear a  medical alert depicting adrenal insufficiency.     -Her Tirosint was recently adjusted -  Advised her to take Tirosint 50 mcg on alternate days while taking 75 mcg on the other alternate days.     - We discussed about the correct intake of her thyroid hormone, on empty stomach at fasting, with water, separated by at least 30 minutes from breakfast and other medications,  and separated by more than 4 hours from calcium, iron, multivitamins, acid reflux medications (PPIs). -Patient is made aware of the fact that thyroid hormone replacement is needed for life, dose to be adjusted by periodic monitoring of thyroid function tests.    -She has no clinical goiter, or thyroid ultrasound from July 2019 was unremarkable except for shrunk bilateral thyroid lobes.    2.  Hyperlipidemia She has significant dyslipidemia  with LDL is 136 slightly improving from 171.  She continues to have intolerance to statins.   This patient will benefit from lifestyle medicine namely whole food plant-based diet in light of her history of rheumatoid arthritis as well. - she acknowledges that there is a room for improvement in her food and drink choices. - Suggestion is made for her to avoid simple carbohydrates  from her diet including Cakes, Sweet Desserts, Ice Cream, Soda (diet and regular), Sweet Tea, Candies, Chips, Cookies, Store Bought Juices, Alcohol , Artificial Sweeteners,  Coffee Creamer, and "Sugar-free" Products, Lemonade. This will help patient to have more stable blood glucose profile and potentially avoid unintended weight gain.  The following Lifestyle Medicine recommendations according to [facility]) were discussed and and offered to patient and she  agrees to start the journey:  A. Whole Foods, Plant-Based Nutrition comprising of fruits and vegetables, plant-based proteins, whole-grain carbohydrates was discussed in detail with the patient.   A list for source of those nutrients were also provided to the patient.  Patient will use only water or unsweetened tea for hydration. B.  The need to stay away from risky substances including alcohol, smoking; obtaining 7 to 9 hours of restorative sleep, at least 150 minutes of moderate intensity exercise weekly, the importance of healthy social connections,  and stress management techniques were discussed. C.  A full color page of  Calorie density of various food groups per pound showing examples of each food groups was provided to the patient.  - I advised patient to maintain close follow up with Ephriam Jenkins E for primary care needs.    I spent 21 minutes in the care of the patient today including review of labs from Thyroid Function, CMP, and other relevant labs ; imaging/biopsy records (current and previous including abstractions from  other facilities); face-to-face time discussing  her lab results and symptoms, medications doses, her options of short and long term treatment based on the latest standards of care / guidelines;   and documenting the encounter.  Zane Herald  participated in the discussions, expressed understanding, and voiced agreement with the above plans.  All questions were answered to her satisfaction. she is encouraged to contact clinic should she have any questions or concerns prior to her return visit.   Follow up plan: Return for Keep Reg. Appt. with Pre-visit Labs.  Glade Lloyd, MD Phone: [phone]  Fax: [phone]   03/26/2022, 6:45 PM

## 2022-04-19 ENCOUNTER — Other Ambulatory Visit: Payer: Self-pay | Admitting: Neurology

## 2022-04-19 DIAGNOSIS — G43009 Migraine without aura, not intractable, without status migrainosus: Secondary | ICD-10-CM

## 2022-04-21 ENCOUNTER — Encounter: Payer: Self-pay | Admitting: "Endocrinology

## 2022-04-22 ENCOUNTER — Other Ambulatory Visit: Payer: Self-pay | Admitting: "Endocrinology

## 2022-04-22 DIAGNOSIS — E039 Hypothyroidism, unspecified: Secondary | ICD-10-CM

## 2022-04-23 ENCOUNTER — Encounter: Payer: Self-pay | Admitting: "Endocrinology

## 2022-04-25 ENCOUNTER — Other Ambulatory Visit: Payer: Self-pay | Admitting: Neurology

## 2022-04-25 DIAGNOSIS — G43009 Migraine without aura, not intractable, without status migrainosus: Secondary | ICD-10-CM

## 2022-04-27 LAB — T4, FREE: Free T4: 1.46 ng/dL (ref 0.82–1.77)

## 2022-04-27 LAB — TSH: TSH: 2.45 u[IU]/mL (ref 0.450–4.500)

## 2022-04-29 ENCOUNTER — Encounter: Payer: Self-pay | Admitting: "Endocrinology

## 2022-05-04 ENCOUNTER — Other Ambulatory Visit: Payer: Self-pay | Admitting: "Endocrinology

## 2022-05-06 ENCOUNTER — Telehealth: Payer: Self-pay

## 2022-05-06 NOTE — Telephone Encounter (Signed)
Rx refill for tirosent sent in by Deidre Ala, LPN.

## 2022-05-23 ENCOUNTER — Ambulatory Visit: Payer: BC Managed Care – PPO | Admitting: Neurology

## 2022-05-23 DIAGNOSIS — G43709 Chronic migraine without aura, not intractable, without status migrainosus: Secondary | ICD-10-CM | POA: Diagnosis not present

## 2022-05-23 MED ORDER — ONABOTULINUMTOXINA 100 UNITS IJ SOLR
200.0000 [IU] | Freq: Once | INTRAMUSCULAR | Status: AC
  Administered 2022-05-23: 155 [IU] via INTRAMUSCULAR

## 2022-05-27 NOTE — Progress Notes (Signed)
[facility]  ° °Procedure Note Botox ° °Attending: Dr. Shaasia Odle ° °Preoperative Diagnosis(es): Chronic migraine ° °Consent obtained from: The patient °Benefits discussed included, but were not limited to decreased muscle tightness, increased joint range of motion, and decreased pain.  Risk discussed included, but were not limited pain and discomfort, bleeding, bruising, excessive weakness, venous thrombosis, muscle atrophy and dysphagia.  Anticipated outcomes of the procedure as well as he risks and benefits of the alternatives to the procedure, and the roles and tasks of the personnel to be involved, were discussed with the patient, and the patient consents to the procedure and agrees to proceed. A copy of the patient medication guide was given to the patient which explains the blackbox warning. ° °Patients identity and treatment sites confirmed Yes.  . ° °Details of Procedure: °Skin was cleaned with alcohol. Prior to injection, the needle plunger was aspirated to make sure the needle was not within a blood vessel.  There was no blood retrieved on aspiration.   ° °Following is a summary of the muscles injected  And the amount of Botulinum toxin used: ° °Dilution °200 units of Botox was reconstituted with 4 ml of preservative free normal saline. °Time of reconstitution: At the time of the office visit (<30 minutes prior to injection)  ° °Injections  °155 total units of Botox was injected with a 30 gauge needle. ° °Injection Sites: °L occipitalis: 15 units- 3 sites  °R occiptalis: 15 units- 3 sites ° °L upper trapezius: 15 units- 3 sites °R upper trapezius: 15 units- 3 sits          °L paraspinal: 10 units- 2 sites °R paraspinal: 10 units- 2 sites ° °Face °L frontalis(2 injection sites):10 units   °R frontalis(2 injection sites):10 units         °L corrugator: 5 units   °R corrugator: 5 units           °Procerus: 5 units   °L temporalis: 20 units °R temporalis: 20 units  ° °Agent:  °200 units of botulinum Type  A (Onobotulinum Toxin type A) was reconstituted with 4 ml of preservative free normal saline.  °Time of reconstitution: At the time of the office visit (<30 minutes prior to injection)  ° ° ° Total injected (Units):  155 ° Total wasted (Units):  45 ° °Patient tolerated procedure well without complications.   °Reinjection is anticipated in 3 months. ° ° °

## 2022-07-02 ENCOUNTER — Encounter: Payer: Self-pay | Admitting: "Endocrinology

## 2022-07-03 ENCOUNTER — Other Ambulatory Visit: Payer: Self-pay | Admitting: *Deleted

## 2022-07-04 ENCOUNTER — Other Ambulatory Visit: Payer: Self-pay | Admitting: "Endocrinology

## 2022-07-22 ENCOUNTER — Other Ambulatory Visit: Payer: Self-pay | Admitting: "Endocrinology

## 2022-07-30 ENCOUNTER — Encounter: Payer: Self-pay | Admitting: "Endocrinology

## 2022-08-03 LAB — 21-HYDROXYLASE ANTIBODIES: 21-Hydroxylase Antibodies: NEGATIVE

## 2022-08-16 LAB — COMPREHENSIVE METABOLIC PANEL
ALT: 13 IU/L (ref 0–32)
AST: 19 IU/L (ref 0–40)
Albumin/Globulin Ratio: 1.8 (ref 1.2–2.2)
Albumin: 4.2 g/dL (ref 3.8–4.9)
Alkaline Phosphatase: 56 IU/L (ref 44–121)
BUN/Creatinine Ratio: 20 (ref 9–23)
BUN: 15 mg/dL (ref 6–24)
Bilirubin Total: 0.3 mg/dL (ref 0.0–1.2)
CO2: 25 mmol/L (ref 20–29)
Calcium: 9.5 mg/dL (ref 8.7–10.2)
Chloride: 101 mmol/L (ref 96–106)
Creatinine, Ser: 0.75 mg/dL (ref 0.57–1.00)
Globulin, Total: 2.3 g/dL (ref 1.5–4.5)
Glucose: 83 mg/dL (ref 70–99)
Potassium: 3.9 mmol/L (ref 3.5–5.2)
Sodium: 141 mmol/L (ref 134–144)
Total Protein: 6.5 g/dL (ref 6.0–8.5)
eGFR: 92 mL/min/{1.73_m2} (ref >59)

## 2022-08-16 LAB — LIPID PANEL
Chol/HDL Ratio: 5 ratio — ABNORMAL HIGH (ref 0.0–4.4)
Chol/HDL Ratio: 5.4 ratio — ABNORMAL HIGH (ref 0.0–4.4)
Cholesterol, Total: 214 mg/dL — ABNORMAL HIGH (ref 100–199)
Cholesterol, Total: 206 mg/dL — ABNORMAL HIGH (ref 100–199)
HDL: 43 mg/dL (ref >39)
HDL: 38 mg/dL — ABNORMAL LOW (ref >39)
LDL Chol Calc (NIH): 141 mg/dL — ABNORMAL HIGH (ref 0–99)
LDL Chol Calc (NIH): 138 mg/dL — ABNORMAL HIGH (ref 0–99)
Triglycerides: 167 mg/dL — ABNORMAL HIGH (ref 0–149)
Triglycerides: 168 mg/dL — ABNORMAL HIGH (ref 0–149)
VLDL Cholesterol Cal: 30 mg/dL (ref 5–40)
VLDL Cholesterol Cal: 30 mg/dL (ref 5–40)

## 2022-08-16 LAB — T4, FREE
Free T4: 1.54 ng/dL (ref 0.82–1.77)
Free T4: 1.5 ng/dL (ref 0.82–1.77)

## 2022-08-16 LAB — CORTISOL-AM, BLOOD: Cortisol - AM: 3.8 ug/dL — ABNORMAL LOW (ref 6.2–19.4)

## 2022-08-16 LAB — TSH
TSH: 3.12 u[IU]/mL (ref 0.450–4.500)
TSH: 3.04 u[IU]/mL (ref 0.450–4.500)

## 2022-08-22 ENCOUNTER — Encounter: Payer: Self-pay | Admitting: "Endocrinology

## 2022-08-22 ENCOUNTER — Ambulatory Visit: Payer: BC Managed Care – PPO | Admitting: "Endocrinology

## 2022-08-22 ENCOUNTER — Ambulatory Visit: Payer: BC Managed Care – PPO | Admitting: Neurology

## 2022-08-22 VITALS — BP 130/80 | HR 76 | Ht 62.0 in | Wt 132.6 lb

## 2022-08-22 DIAGNOSIS — E039 Hypothyroidism, unspecified: Secondary | ICD-10-CM | POA: Diagnosis not present

## 2022-08-22 DIAGNOSIS — E782 Mixed hyperlipidemia: Secondary | ICD-10-CM | POA: Diagnosis not present

## 2022-08-22 DIAGNOSIS — E2740 Unspecified adrenocortical insufficiency: Secondary | ICD-10-CM | POA: Diagnosis not present

## 2022-08-22 MED ORDER — REPATHA 140 MG/ML ~~LOC~~ SOSY: PREFILLED_SYRINGE | SUBCUTANEOUS | 2 refills | Status: DC

## 2022-08-22 NOTE — Progress Notes (Signed)
08/22/2022  Endocrinology follow-up note   Subjective:    Patient ID: Laurie Cherry, female    DOB: [birth year], PCP Laurie Cherry   Past Medical History:  Diagnosis Date   Depression    Esophagitis    Family history of adverse reaction to anesthesia    Sister   Fibroids    GERD (gastroesophageal reflux disease)    Hypothyroidism    Rheumatoid arthritis ([place])    Rheumatoid arthritis ([place]) 01/20/2017   Worsening headaches    Past Surgical History:  Procedure Laterality Date   ABDOMINAL HYSTERECTOMY N/A 04/03/2017   Procedure: TOTAL HYSTERECTOMY ABDOMINAL;  Surgeon: Louretta Shorten, MD;  Location: [facility];  Service: Gynecology;  Laterality: N/A;   BIOPSY  04/10/2018   Procedure: BIOPSY;  Surgeon: Rogene Houston, MD;  Location: [facility];  Service: Endoscopy;;  esophegus    BIOPSY  10/19/2018   Procedure: BIOPSY;  Surgeon: Rogene Houston, MD;  [facility];  Service: Endoscopy;;  esophageal    BIOPSY  04/12/2020   Procedure: BIOPSY;  Surgeon: Rogene Houston, MD;  [facility];  Service: Endoscopy;;   COLONOSCOPY     COLONOSCOPY     COLONOSCOPY N/A 04/10/2018   Procedure: COLONOSCOPY;  Surgeon: Rogene Houston, MD;  [facility];  Service: Endoscopy;  Laterality: N/A;   ESOPHAGEAL DILATION N/A 04/10/2018   Procedure: ESOPHAGEAL DILATION;  Surgeon: Rogene Houston, MD;  [facility];  Service: Endoscopy;  Laterality: N/A;  balloon dilation   ESOPHAGEAL DILATION N/A 10/19/2018   Procedure: ESOPHAGEAL DILATION;  Surgeon: Rogene Houston, MD;  [facility];  Service: Endoscopy;  Laterality: N/A;   ESOPHAGEAL DILATION N/A 04/12/2020   Procedure: ESOPHAGEAL DILATION;  Surgeon: Rogene Houston, MD;  [facility];  Service: Endoscopy;  Laterality: N/A;   ESOPHAGOGASTRODUODENOSCOPY     ESOPHAGOGASTRODUODENOSCOPY N/A 04/10/2018   Procedure: ESOPHAGOGASTRODUODENOSCOPY (EGD);  Surgeon: Rogene Houston, MD;   [facility];  Service: Endoscopy;  Laterality: N/A;  1:25   ESOPHAGOGASTRODUODENOSCOPY N/A 10/19/2018   Procedure: ESOPHAGOGASTRODUODENOSCOPY (EGD);  Surgeon: Rogene Houston, MD;  [facility];  Service: Endoscopy;  Laterality: N/A;  7:30   ESOPHAGOGASTRODUODENOSCOPY N/A 04/12/2020   Procedure: ESOPHAGOGASTRODUODENOSCOPY (EGD);  Surgeon: Rogene Houston, MD;  [facility];  Service: Endoscopy;  Laterality: N/A;  235   EYE SURGERY Right    SALPINGOOPHORECTOMY Bilateral 04/03/2017   Procedure: SALPINGO OOPHORECTOMY;  Surgeon: Louretta Shorten, MD;  Location: [facility];  Service: Gynecology;  Laterality: Bilateral;   Social History   Socioeconomic History   Marital status: Married    Spouse name: Not on file   Number of children: 1   Years of education: Not on file   Highest education level: Master's degree (Cherry.g., MA, MS, MEng, MEd, MSW, MBA)  Occupational History   Not on file  Tobacco Use   Smoking status: Never   Smokeless tobacco: Never  Vaping Use   Vaping Use: Never used  Substance and Sexual Activity   Alcohol use: No    Alcohol/week: 0.0 standard drinks of alcohol   Drug use: No   Sexual activity: Not on file  Other Topics Concern   Not on file  Social History Narrative   Pt is married lives with spouse in 2 story home she has 1 children, Master Degree in Education   She is right handed, and drinks coffee once a  day, 1 soda a day and not tea   She does not work out at all   Social Determinants of Radio broadcast assistant Strain: Not on file  Food Insecurity: Not on file  Transportation Needs: Not on file  Physical Activity: Not on file  Stress: Not on file  Social Connections: Not on file   Outpatient Encounter Medications as of 08/22/2022  Medication Sig   Evolocumab (REPATHA) 140 MG/ML SOSY Inject 164m under the skin every  2 weeks.   APPLE CIDER VINEGAR PO Take 2 capsules by mouth daily. (Patient not taking: Reported on 08/22/2022)    Botulinum Toxin Type A (BOTOX) 200 units SOLR Inject 155 units IM into the multiple sites in the face neck and head once every 90 days   citalopram (CELEXA) 20 MG tablet Take 10 mg by mouth at bedtime.   estradiol (ESTRACE) 0.5 MG tablet Take 0.5 mg by mouth daily.   folic acid (FOLVITE) 1 MG tablet Take 1 mg by mouth daily.    hydrocortisone (CORTEF) 5 MG tablet TAKE 2 TABLET(10MG) BY MOUTH DAILY AT 8AM AND THEN TAKE 1 TABLET(5MG) DAILY AT NOON   loratadine (CLARITIN) 10 MG tablet Take 10 mg by mouth at bedtime.    methotrexate (RHEUMATREX) 2.5 MG tablet Take 10 mg by mouth once a week. Caution:Chemotherapy. Protect from light.   montelukast (SINGULAIR) 10 MG tablet TAKE 1 TABLET(10 MG) BY MOUTH AT BEDTIME   Multiple Vitamins-Minerals (IMMUNE SUPPORT PO) Take 1 Scoop by mouth daily.   propranolol ER (INDERAL LA) 60 MG 24 hr capsule TAKE 1 CAPSULE(60 MG) BY MOUTH DAILY   RABEprazole (ACIPHEX) 20 MG tablet TAKE 1 TABLET BY MOUTH EVERY MORNING 30 MINUTES BEFORE BREAKFAST   RINVOQ 15 MG TB24 Take 1 tablet by mouth daily.   rosuvastatin (CRESTOR) 5 MG tablet TAKE 1 TABLET(5 MG) BY MOUTH DAILY   SUMAtriptan (IMITREX) 100 MG tablet TAKE 1 TABLET BY MOUTH AT ONSET OF HEADACHE. MAY REPEAT IN 2 HOURS IF HEADACHE PERSISTS   TIROSINT 50 MCG CAPS TAKE 1 CAPSULE BY MOUTH BEFORE BREAKFAST EVERY OTHER DAY ALTERNATING WITH THE 75 MCG   Vitamin D, Ergocalciferol, (DRISDOL) 50000 units CAPS capsule Take 50,000 Units by mouth every Sunday.    [DISCONTINUED] Adalimumab 40 MG/0.8ML PNKT Inject 40 mg into the skin every 14 (fourteen) days. Every other week on Sunday   [DISCONTINUED] naproxen (NAPROSYN) 500 MG tablet TAKE 1 TABLET BY MOUTH EVERY 12 HOURS AS NEEDED   No facility-administered encounter medications on file as of 08/22/2022.   ALLERGIES: Allergies  Allergen Reactions   Arava [Leflunomide] Hives   Azithromycin Other (See Comments)    Severe bloating, abd pain   Cefuroxime Axetil Other (See  Comments)    Increased bloating, abd pain   Clarithromycin Other (See Comments)    abd pain   Etanercept Hives and Other (See Comments)    Injection site irritation (ENBREL)   Levofloxacin Hives and Other (See Comments)    Severe dizziness   Sulfa Antibiotics Hives and Other (See Comments)    abd pain   Methotrexate Derivatives Other (See Comments)    Injection ONLY (irritation at the site of injection)   VACCINATION STATUS:  There is no immunization history on file for this patient.  HPI  61year old female patient with medical history as above.  She is being seen in follow-up for hypothyroidism, renal insufficiency, hyperlipidemia. This visit is due to her recent adrenal assessment which confirms adrenal insufficiency.  See lab results below.  Patient has significant exposure to steroids over the years due to her autoimmune conditions. ACTH stimulation test confirmed possibility of primary adrenal insufficiency.  21 hydroxylase enzyme antibody was negative.  She states that she was diagnosed with hypothyroidism approximately at age 29. -Her Tirosint was recently adjusted at 50 mcg p.o. daily before breakfast.  She has no new complaints.    She still does not tolerate Crestor.  She has partially engaged with whole food plant-based diet.  Her LDL remains high at 138.     - She has family  history of thyroid dysfunction in multiple members of her family.  She denies any history of goiter. She denies any family history of thyroid cancer. - She underwent total abdominal hysterectomy/ salphigo oophorectomy due to a large ovarian cyst. Findings were reportedly benign.  -She is on multiple medications for her rheumatoid arthritis.   Review of Systems Limited as above.  Objective:    BP 130/80   Pulse 76   Ht '5\' 2"'  (1.575 m)   Wt 132 lb 9.6 oz (60.1 kg)   LMP 03/31/2017   BMI 24.25 kg/m   Wt Readings from Last 3 Encounters:  08/22/22 132 lb 9.6 oz (60.1 kg)  03/26/22 135 lb  3.2 oz (61.3 kg)  02/20/22 136 lb (61.7 kg)    Physical Exam    Recent Results (from the past 2160 hour(s))  21-Hydroxylase Antibodies     Status: None   Collection Time: 07/22/22  3:17 PM  Result Value Ref Range   21-Hydroxylase Antibodies Negative     Comment:  Reference Interval: Negative  This result is a qualitative determination of autoantibodies to 21-Hydroxylase (21-OH Abs) in patient serum. 21-OH Abs occur in autoimmune Addison's disease, whether isolated or part of type I or type II autoimmune polyglandular syndrome. This result should be used in conjunction with other clinical and laboratory findings and is not a substitute for functional testing required to diagnose adrenal insufficiency.   TSH     Status: None   Collection Time: 08/15/22  8:21 AM  Result Value Ref Range   TSH 3.120 0.450 - 4.500 uIU/mL  T4, free     Status: None   Collection Time: 08/15/22  8:21 AM  Result Value Ref Range   Free T4 1.54 0.82 - 1.77 ng/dL  Comprehensive metabolic panel     Status: None   Collection Time: 08/15/22  8:21 AM  Result Value Ref Range   Glucose 83 70 - 99 mg/dL   BUN 15 6 - 24 mg/dL   Creatinine, Ser 0.75 0.57 - 1.00 mg/dL   eGFR 92 >59 mL/min/1.73   BUN/Creatinine Ratio 20 9 - 23   Sodium 141 134 - 144 mmol/L   Potassium 3.9 3.5 - 5.2 mmol/L   Chloride 101 96 - 106 mmol/L   CO2 25 20 - 29 mmol/L   Calcium 9.5 8.7 - 10.2 mg/dL   Total Protein 6.5 6.0 - 8.5 g/dL   Albumin 4.2 3.8 - 4.9 g/dL   Globulin, Total 2.3 1.5 - 4.5 g/dL   Albumin/Globulin Ratio 1.8 1.2 - 2.2   Bilirubin Total 0.3 0.0 - 1.2 mg/dL   Alkaline Phosphatase 56 44 - 121 IU/L   AST 19 0 - 40 IU/L   ALT 13 0 - 32 IU/L  Lipid panel     Status: Abnormal   Collection Time: 08/15/22  8:21 AM  Result Value Ref Range   Cholesterol, Total 214 (  H) 100 - 199 mg/dL   Triglycerides 167 (H) 0 - 149 mg/dL   HDL 43 >39 mg/dL   VLDL Cholesterol Cal 30 5 - 40 mg/dL   LDL Chol Calc (NIH) 141 (H) 0 - 99  mg/dL   Chol/HDL Ratio 5.0 (H) 0.0 - 4.4 ratio    Comment:                                   T. Chol/HDL Ratio                                             Men  Women                               1/2 Avg.Risk  3.4    3.3                                   Avg.Risk  5.0    4.4                                2X Avg.Risk  9.6    7.1                                3X Avg.Risk 23.4   11.0   TSH     Status: None   Collection Time: 08/15/22  8:24 AM  Result Value Ref Range   TSH 3.040 0.450 - 4.500 uIU/mL  T4, free     Status: None   Collection Time: 08/15/22  8:24 AM  Result Value Ref Range   Free T4 1.50 0.82 - 1.77 ng/dL  Lipid panel     Status: Abnormal   Collection Time: 08/15/22  8:24 AM  Result Value Ref Range   Cholesterol, Total 206 (H) 100 - 199 mg/dL   Triglycerides 168 (H) 0 - 149 mg/dL   HDL 38 (L) >39 mg/dL   VLDL Cholesterol Cal 30 5 - 40 mg/dL   LDL Chol Calc (NIH) 138 (H) 0 - 99 mg/dL   Chol/HDL Ratio 5.4 (H) 0.0 - 4.4 ratio    Comment:                                   T. Chol/HDL Ratio                                             Men  Women                               1/2 Avg.Risk  3.4    3.3                                   Avg.Risk  5.0  4.4                                2X Avg.Risk  9.6    7.1                                3X Avg.Risk 23.4   11.0   Cortisol-am, blood     Status: Abnormal   Collection Time: 08/15/22  8:24 AM  Result Value Ref Range   Cortisol - AM 3.8 (L) 6.2 - 19.4 ug/dL    - On 08/16/2016 her free T4 was 1.36, improving from prior studies. - On 05/09/2016: Free T4 0.63, TSH 7.58, TPO antibodies 13  Assessment & Plan:   Adrenal insufficiency 2.  Hypothyroidism  3.  Dyslipidemia I reviewed her recent labs assessing adrenal insufficiency.  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  Unlikely to be secondary to Addison's disease- now that she has negative antibodies for  21-hydroxylase .  -She is advised to continue on hydrocortisone 10 mg p.o. daily at 8 AM, and 5 mg p.o. daily at noon.   Sick day rules discussed with her.  She will likely need glucocorticoid replacement for life.  She will wear a medical alert depicting adrenal insufficiency.   Her blood pressure is stable, will not need mineralocorticoid replacement at this time.   -Her Tirosint was recently adjusted -  Advised her to take Tirosint 50 mcg on alternate days while taking 75 mcg on the other alternate days.    - We discussed about the correct intake of her thyroid hormone, on empty stomach at fasting, with water, separated by at least 30 minutes from breakfast and other medications,  and separated by more than 4 hours from calcium, iron, multivitamins, acid reflux medications (PPIs). -Patient is made aware of the fact that thyroid hormone replacement is needed for life, dose to be adjusted by periodic monitoring of thyroid function tests.  -She has no clinical goiter, or thyroid ultrasound from July 2019 was unremarkable except for shrunk bilateral thyroid lobes.    2.  Hyperlipidemia She has significant dyslipidemia with LDL is 138, overall improving from 171.  She continues to have intolerance to statins.    She is open for this prescription for Repatha.  I discussed and prescribed Repatha 140 mg subcutaneously every 2 weeks.  He would still benefit from lifestyle medicine.  - she acknowledges that there is a room for improvement in her food and drink choices. - Suggestion is made for her to avoid simple carbohydrates  from her diet including Cakes, Sweet Desserts, Ice Cream, Soda (diet and regular), Sweet Tea, Candies, Chips, Cookies, Store Bought Juices, Alcohol , Artificial Sweeteners,  Coffee Creamer, and "Sugar-free" Products, Lemonade. This will help patient to have more stable blood glucose profile and potentially avoid unintended weight gain.  The following Lifestyle Medicine  recommendations according to [place]  [facility]) were discussed and and offered to patient and she  agrees to start the journey:  A. Whole Foods, Plant-Based Nutrition comprising of fruits and vegetables, plant-based proteins, whole-grain carbohydrates was discussed in detail with the patient.   A list for source of those nutrients were also provided to the patient.  Patient will use only water or unsweetened tea for hydration. B.  The need to stay  away from risky substances including alcohol, smoking; obtaining 7 to 9 hours of restorative sleep, at least 150 minutes of moderate intensity exercise weekly, the importance of healthy social connections,  and stress management techniques were discussed. C.  A full color page of  Calorie density of various food groups per pound showing examples of each food groups was provided to the patient.   - I advised patient to maintain close follow up with Laurie Cherry for primary care needs.   I spent 32 minutes in the care of the patient today including review of labs from Thyroid Function, CMP, and other relevant labs ; imaging/biopsy records (current and previous including abstractions from other facilities); face-to-face time discussing  her lab results and symptoms, medications doses, her options of short and long term treatment based on the latest standards of care / guidelines;   and documenting the encounter.  Zane Herald  participated in the discussions, expressed understanding, and voiced agreement with the above plans.  All questions were answered to her satisfaction. she is encouraged to contact clinic should she have any questions or concerns prior to her return visit.  Follow up plan: Return in about 3 months (around 11/22/2022) for Fasting Labs  in AM B4 8.  Glade Lloyd, MD Phone: [phone]  Fax: [phone]   08/22/2022, 8:04 PM

## 2022-08-23 ENCOUNTER — Ambulatory Visit (INDEPENDENT_AMBULATORY_CARE_PROVIDER_SITE_OTHER): Payer: BC Managed Care – PPO | Admitting: Neurology

## 2022-08-23 DIAGNOSIS — G43709 Chronic migraine without aura, not intractable, without status migrainosus: Secondary | ICD-10-CM

## 2022-08-23 MED ORDER — ONABOTULINUMTOXINA 100 UNITS IJ SOLR
200.0000 [IU] | Freq: Once | INTRAMUSCULAR | Status: AC
  Administered 2022-08-23: 155 [IU] via INTRAMUSCULAR

## 2022-08-23 NOTE — Progress Notes (Signed)
[facility]  ° °Procedure Note Botox ° °Attending: Dr. Ladell Lea ° °Preoperative Diagnosis(es): Chronic migraine ° °Consent obtained from: The patient °Benefits discussed included, but were not limited to decreased muscle tightness, increased joint range of motion, and decreased pain.  Risk discussed included, but were not limited pain and discomfort, bleeding, bruising, excessive weakness, venous thrombosis, muscle atrophy and dysphagia.  Anticipated outcomes of the procedure as well as he risks and benefits of the alternatives to the procedure, and the roles and tasks of the personnel to be involved, were discussed with the patient, and the patient consents to the procedure and agrees to proceed. A copy of the patient medication guide was given to the patient which explains the blackbox warning. ° °Patients identity and treatment sites confirmed Yes.  . ° °Details of Procedure: °Skin was cleaned with alcohol. Prior to injection, the needle plunger was aspirated to make sure the needle was not within a blood vessel.  There was no blood retrieved on aspiration.   ° °Following is a summary of the muscles injected  And the amount of Botulinum toxin used: ° °Dilution °200 units of Botox was reconstituted with 4 ml of preservative free normal saline. °Time of reconstitution: At the time of the office visit (<30 minutes prior to injection)  ° °Injections  °155 total units of Botox was injected with a 30 gauge needle. ° °Injection Sites: °L occipitalis: 15 units- 3 sites  °R occiptalis: 15 units- 3 sites ° °L upper trapezius: 15 units- 3 sites °R upper trapezius: 15 units- 3 sits          °L paraspinal: 10 units- 2 sites °R paraspinal: 10 units- 2 sites ° °Face °L frontalis(2 injection sites):10 units   °R frontalis(2 injection sites):10 units         °L corrugator: 5 units   °R corrugator: 5 units           °Procerus: 5 units   °L temporalis: 20 units °R temporalis: 20 units  ° °Agent:  °200 units of botulinum Type  A (Onobotulinum Toxin type A) was reconstituted with 4 ml of preservative free normal saline.  °Time of reconstitution: At the time of the office visit (<30 minutes prior to injection)  ° ° ° Total injected (Units):  155 ° Total wasted (Units):  45 ° °Patient tolerated procedure well without complications.   °Reinjection is anticipated in 3 months. ° ° °

## 2022-08-27 ENCOUNTER — Other Ambulatory Visit (HOSPITAL_COMMUNITY): Payer: Self-pay

## 2022-08-29 ENCOUNTER — Other Ambulatory Visit (HOSPITAL_BASED_OUTPATIENT_CLINIC_OR_DEPARTMENT_OTHER): Payer: Self-pay

## 2022-08-29 ENCOUNTER — Telehealth: Payer: Self-pay

## 2022-08-29 ENCOUNTER — Other Ambulatory Visit (HOSPITAL_COMMUNITY): Payer: Self-pay

## 2022-08-29 NOTE — Telephone Encounter (Signed)
Patient Advocate Encounter  Received notification from [employer] [employer] that prior authorization is required for Repatha '140MG'$ /ML syringes. PA submitted and APPROVED on 08/29/2022.  Key [id number] Effective: 08/29/2022 - 08/29/2023

## 2022-08-29 NOTE — Telephone Encounter (Signed)
Pharmacy Patient Advocate Encounter   Received notification from [facility] that prior authorization for Tirosint 50MCG capsules is required/requested.  PA submitted on 08/29/2022 to [place] via CoverMyMeds Key [id number]  Status is pending

## 2022-08-30 ENCOUNTER — Ambulatory Visit: Payer: BC Managed Care – PPO | Admitting: Neurology

## 2022-08-30 NOTE — Telephone Encounter (Signed)
Patient Advocate Encounter  Prior Authorization for Tirosint 50MCG capsules has been approved.    PA# [id number] Key: [id number]  Effective dates: 08/29/2022 through 08/30/2023

## 2022-09-03 ENCOUNTER — Telehealth: Payer: Self-pay | Admitting: Pharmacy Technician

## 2022-09-03 NOTE — Telephone Encounter (Signed)
Submitted a Prior Authorization request to [facility] [employer] for  Botox  via Fax. Will update once we receive a response.   Fax# [phone] Phone# [phone]

## 2022-09-04 NOTE — Progress Notes (Deleted)
NEUROLOGY FOLLOW UP OFFICE NOTE  Laurie Cherry [id number]  Assessment/Plan:   Migraine without aura, without status migrainosus, not intractable - continues to respond well to Botox.     No change in current management for now.  If headaches do not improve by the time I see her for her next Botox appointment, we will make a change in management. Migraine prevention:  Botox every 3 months, propranolol ER '60mg'$  daily Migraine rescue:  sumatriptan 50-'100mg'$  Limit use of pain relievers to no more than 2 days out of week to prevent risk of rebound or medication-overuse headache. Keep headache diary  Subjective:  Laurie Cherry is a 61 year old right-handed female with rheumatoid arthritis, hypothyroidism, reflux and mild depression who follows up for migraines.   UPDATE: Migraines have been relatively well-controlled.  Moderate intensity, 1 hour with sumatriptan, and occurring 2-3 times a month.  However, she has been having frequent mild headaches since end of September.  An aching pressure around her eyes, either side of her forehead or along the jaw line/TMJ.  No associated symptoms.  They have been occurring 3 to 4 days a week.  She feels some nasal congestion.  She also reports that she has had worsening GERD, causing her to take her Aciphex frequently.     Current NSAIDS:  naproxen '500mg'$  Current analgesics:  Excedrin Migraine Current triptans:  Sumatriptan '50mg'$  ('100mg'$  caused drowsiness) w/wo naproxen '500mg'$  Current ergotamine:  none Current anti-emetic:  none Current muscle relaxants:  none Current anti-anxiolytic:  none Current sleep aide:  none Current Antihypertensive medications:  Propranolol ER '60mg'$  Current Antidepressant medications:  Celexa Current Anticonvulsant medications:  none Current anti-CGRP:  none Current Vitamins/Herbal/Supplements:  folic acid Current Antihistamines/Decongestants:  Claritin Other therapy:  Botox Hormone/birth control:  estradiol Other  medications:  Adalimumab, methotrexate   Caffeine:  1 cup of coffee daily Alcohol:  no Smoker:  no Diet:  1/2 can of soda daily.  Drinks water Exercise:  no Depression:  stable; Anxiety:  no Other pain:  no Sleep hygiene:  varies   HISTORY: She has a history of "hormonal" migraines most of her life, described as pounding right temporal pain and often associated with her menstrual cycle.  They have pretty much stabilized but she will still get it every now and then.     She was diagnosed with rheumatoid arthritis in 2014.  She had been on methotrexate for about 2 years (stopped a month ago).  In September 2015, she began having new headaches.  It coincided with change in her thyroid medications.  They are left sided, 7/10 non-throbbing pain.    They are associated with photophobia, phonophobia, and osmophobia but not nausea.   Initially, they typically lasted 3 to 4 days and occur 1 to 2 times a week (however she also has a dull daily headache) Stress is a trigger.  Change in thyroid medication may have been a trigger. These headaches do not correlate with her menstrual cycle.  Sumatriptan with naproxen helps relieve it.   Past NSAIDS:  no Past analgesics:  Excedrin, Tylenol (ineffective) Past abortive triptans:  no Past muscle relaxants:  no Past anti-emetic:  no Past antihypertensive medications:  atenolol ('25mg'$  ineffective and higher doses caused dizziness) Past antidepressant medications:  venlafaxine XR '150mg'$  Past anticonvulsant medications:  topiramate '25mg'$  (caused drowsiness, but willing to retry it if needed) Past anti-CGRP:  Aimovig '70mg'$  (she was on it for 2 months.  Ineffective) Past vitamins/Herbal/Supplements:  no Past antihistamines/decongestants:  no  Other past therapies:  no   Family history of headache:  Mom, sister Mother had glioblastoma  PAST MEDICAL HISTORY: Past Medical History:  Diagnosis Date   Depression    Esophagitis    Family history of adverse  reaction to anesthesia    Sister   Fibroids    GERD (gastroesophageal reflux disease)    Hypothyroidism    Rheumatoid arthritis ([place])    Rheumatoid arthritis ([place]) 01/20/2017   Worsening headaches     MEDICATIONS: Current Outpatient Medications on File Prior to Visit  Medication Sig Dispense Refill   APPLE CIDER VINEGAR PO Take 2 capsules by mouth daily. (Patient not taking: Reported on 08/22/2022)     Botulinum Toxin Type A (BOTOX) 200 units SOLR Inject 155 units IM into the multiple sites in the face neck and head once every 90 days 1 each 4   citalopram (CELEXA) 20 MG tablet Take 10 mg by mouth at bedtime.  3   estradiol (ESTRACE) 0.5 MG tablet Take 0.5 mg by mouth daily.     Evolocumab (REPATHA) 140 MG/ML SOSY Inject '140mg'$  under the skin every  2 weeks. 2 mL 2   folic acid (FOLVITE) 1 MG tablet Take 1 mg by mouth daily.      hydrocortisone (CORTEF) 5 MG tablet TAKE 2 TABLET('10MG'$ ) BY MOUTH DAILY AT 8AM AND THEN TAKE 1 TABLET('5MG'$ ) DAILY AT NOON 270 tablet 0   loratadine (CLARITIN) 10 MG tablet Take 10 mg by mouth at bedtime.      methotrexate (RHEUMATREX) 2.5 MG tablet Take 10 mg by mouth once a week. Caution:Chemotherapy. Protect from light.     montelukast (SINGULAIR) 10 MG tablet TAKE 1 TABLET(10 MG) BY MOUTH AT BEDTIME 90 tablet 3   Multiple Vitamins-Minerals (IMMUNE SUPPORT PO) Take 1 Scoop by mouth daily.     propranolol ER (INDERAL LA) 60 MG 24 hr capsule TAKE 1 CAPSULE(60 MG) BY MOUTH DAILY 90 capsule 0   RABEprazole (ACIPHEX) 20 MG tablet TAKE 1 TABLET BY MOUTH EVERY MORNING 30 MINUTES BEFORE BREAKFAST 90 tablet 3   RINVOQ 15 MG TB24 Take 1 tablet by mouth daily.     rosuvastatin (CRESTOR) 5 MG tablet TAKE 1 TABLET(5 MG) BY MOUTH DAILY 30 tablet 2   SUMAtriptan (IMITREX) 100 MG tablet TAKE 1 TABLET BY MOUTH AT ONSET OF HEADACHE. MAY REPEAT IN 2 HOURS IF HEADACHE PERSISTS 10 tablet 3   TIROSINT 50 MCG CAPS TAKE 1 CAPSULE BY MOUTH BEFORE BREAKFAST EVERY OTHER DAY ALTERNATING WITH  THE 75 MCG 30 capsule 2   Vitamin D, Ergocalciferol, (DRISDOL) 50000 units CAPS capsule Take 50,000 Units by mouth every Sunday.      No current facility-administered medications on file prior to visit.    ALLERGIES: Allergies  Allergen Reactions   Arava [Leflunomide] Hives   Azithromycin Other (See Comments)    Severe bloating, abd pain   Cefuroxime Axetil Other (See Comments)    Increased bloating, abd pain   Clarithromycin Other (See Comments)    abd pain   Etanercept Hives and Other (See Comments)    Injection site irritation (ENBREL)   Levofloxacin Hives and Other (See Comments)    Severe dizziness   Sulfa Antibiotics Hives and Other (See Comments)    abd pain   Methotrexate Derivatives Other (See Comments)    Injection ONLY (irritation at the site of injection)    FAMILY HISTORY: Family History  Problem Relation Age of Onset   Thyroid disease Mother  Heart attack Mother    Stroke Mother    Diabetes Mother    Brain cancer Mother    Colon cancer Father    Heart attack Sister    Hypertension Sister    Hypertension Brother       Objective:  *** General: No acute distress.  Patient appears ***-groomed.   Head:  Normocephalic/atraumatic Eyes:  Fundi examined but not visualized Neck: supple, no paraspinal tenderness, full range of motion Heart:  Regular rate and rhythm Lungs:  Clear to auscultation bilaterally Back: No paraspinal tenderness Neurological Exam: alert and oriented to person, place, and time.  Speech fluent and not dysarthric, language intact.  CN II-XII intact. Bulk and tone normal, muscle strength 5/5 throughout.  Sensation to light touch intact.  Deep tendon reflexes 2+ throughout, toes downgoing.  Finger to nose testing intact.  Gait normal, Romberg negative.   Metta Clines, DO  CC: ***

## 2022-09-05 ENCOUNTER — Ambulatory Visit: Payer: BC Managed Care – PPO | Admitting: Neurology

## 2022-09-21 ENCOUNTER — Other Ambulatory Visit: Payer: Self-pay | Admitting: "Endocrinology

## 2022-10-01 ENCOUNTER — Other Ambulatory Visit (HOSPITAL_COMMUNITY): Payer: Self-pay

## 2022-10-01 NOTE — Telephone Encounter (Signed)
Received notification from [facility] regarding a prior authorization for  Botox . Authorization has been APPROVED from 09/03/22 to 09/04/23.  Authorization # [id number] Phone # [phone]

## 2022-10-14 ENCOUNTER — Other Ambulatory Visit: Payer: Self-pay | Admitting: "Endocrinology

## 2022-11-01 ENCOUNTER — Other Ambulatory Visit: Payer: Self-pay | Admitting: "Endocrinology

## 2022-11-04 NOTE — Progress Notes (Deleted)
NEUROLOGY FOLLOW UP OFFICE NOTE  Laurie Cherry [id number]  Assessment/Plan:   Chronic migraine without aura, without status migrainosus, not intractable - doing well on Botox     Migraine prevention:  Botox every 3 months, propranolol ER '60mg'$  daily Migraine rescue:  sumatriptan 50-'100mg'$  Limit use of pain relievers to no more than 2 days out of week to prevent risk of rebound or medication-overuse headache. Keep headache diary  Subjective:  Laurie Cherry is a 62 year old right-handed female with rheumatoid arthritis, hypothyroidism, reflux and mild depression who follows up for migraines.   UPDATE: Intensity:  *** Duration:  *** Frequency:  ***   Current NSAIDS:  naproxen '500mg'$  Current analgesics:  Excedrin Migraine Current triptans:  Sumatriptan '50mg'$  ('100mg'$  caused drowsiness) w/wo naproxen '500mg'$  Current ergotamine:  none Current anti-emetic:  none Current muscle relaxants:  none Current anti-anxiolytic:  none Current sleep aide:  none Current Antihypertensive medications:  Propranolol ER '60mg'$  Current Antidepressant medications:  Celexa Current Anticonvulsant medications:  none Current anti-CGRP:  none Current Vitamins/Herbal/Supplements:  folic acid, D Current Antihistamines/Decongestants:  Claritin Other therapy:  Botox   Caffeine:  1 cup of coffee daily Alcohol:  no Smoker:  no Diet:  1/2 can of soda daily.  Drinks water Exercise:  no Depression:  stable; Anxiety:  no Other pain:  no Sleep hygiene:  varies   HISTORY: She has a history of "hormonal" migraines most of her life, described as pounding right temporal pain and often associated with her menstrual cycle.  They have pretty much stabilized but she will still get it every now and then.     She was diagnosed with rheumatoid arthritis in 2014.  She had been on methotrexate for about 2 years (stopped a month ago).  In September 2015, she began having new headaches.  It coincided with change in her  thyroid medications.  They are left sided, 7/10 non-throbbing pain.    They are associated with photophobia, phonophobia, and osmophobia but not nausea.   Initially, they typically lasted 3 to 4 days and occur 1 to 2 times a week (however she also has a dull daily headache) Stress is a trigger.  Change in thyroid medication may have been a trigger. These headaches do not correlate with her menstrual cycle.  Sumatriptan with naproxen helps relieve it.   Past NSAIDS:  no Past analgesics:  Excedrin, Tylenol (ineffective) Past abortive triptans:  no Past muscle relaxants:  no Past anti-emetic:  no Past antihypertensive medications:  atenolol ('25mg'$  ineffective and higher doses caused dizziness) Past antidepressant medications:  venlafaxine XR '150mg'$  Past anticonvulsant medications:  topiramate '25mg'$  (caused drowsiness, but willing to retry it if needed) Past anti-CGRP:  Aimovig '70mg'$  (she was on it for 2 months.  Ineffective) Past vitamins/Herbal/Supplements:  no Past antihistamines/decongestants:  no Other past therapies:  no   Family history of headache:  Mom, sister Mother had glioblastoma  PAST MEDICAL HISTORY: Past Medical History:  Diagnosis Date   Depression    Esophagitis    Family history of adverse reaction to anesthesia    Sister   Fibroids    GERD (gastroesophageal reflux disease)    Hypothyroidism    Rheumatoid arthritis ([place])    Rheumatoid arthritis ([place]) 01/20/2017   Worsening headaches     MEDICATIONS: Current Outpatient Medications on File Prior to Visit  Medication Sig Dispense Refill   APPLE CIDER VINEGAR PO Take 2 capsules by mouth daily. (Patient not taking: Reported on 08/22/2022)  Botulinum Toxin Type A (BOTOX) 200 units SOLR Inject 155 units IM into the multiple sites in the face neck and head once every 90 days 1 each 4   citalopram (CELEXA) 20 MG tablet Take 10 mg by mouth at bedtime.  3   estradiol (ESTRACE) 0.5 MG tablet Take 0.5 mg by mouth daily.      Evolocumab (REPATHA) 140 MG/ML SOSY Inject '140mg'$  under the skin every  2 weeks. 2 mL 2   folic acid (FOLVITE) 1 MG tablet Take 1 mg by mouth daily.      hydrocortisone (CORTEF) 5 MG tablet TAKE 2 TABLET BY MOUTH EVERY DAY AT 8 AM AND THEN TAKE 1 TABLET BY MOUTH EVERY DAY AT NOON 270 tablet 0   loratadine (CLARITIN) 10 MG tablet Take 10 mg by mouth at bedtime.      methotrexate (RHEUMATREX) 2.5 MG tablet Take 10 mg by mouth once a week. Caution:Chemotherapy. Protect from light.     montelukast (SINGULAIR) 10 MG tablet TAKE 1 TABLET(10 MG) BY MOUTH AT BEDTIME 90 tablet 3   Multiple Vitamins-Minerals (IMMUNE SUPPORT PO) Take 1 Scoop by mouth daily.     propranolol ER (INDERAL LA) 60 MG 24 hr capsule TAKE 1 CAPSULE(60 MG) BY MOUTH DAILY 90 capsule 0   RABEprazole (ACIPHEX) 20 MG tablet TAKE 1 TABLET BY MOUTH EVERY MORNING 30 MINUTES BEFORE BREAKFAST 90 tablet 3   RINVOQ 15 MG TB24 Take 1 tablet by mouth daily.     rosuvastatin (CRESTOR) 5 MG tablet TAKE 1 TABLET(5 MG) BY MOUTH DAILY 30 tablet 2   SUMAtriptan (IMITREX) 100 MG tablet TAKE 1 TABLET BY MOUTH AT ONSET OF HEADACHE. MAY REPEAT IN 2 HOURS IF HEADACHE PERSISTS 10 tablet 3   TIROSINT 50 MCG CAPS TAKE 1 CAPSULE(50 MCG) BY MOUTH DAILY BEFORE BREAKFAST 30 capsule 2   Vitamin D, Ergocalciferol, (DRISDOL) 50000 units CAPS capsule Take 50,000 Units by mouth every Sunday.      No current facility-administered medications on file prior to visit.    ALLERGIES: Allergies  Allergen Reactions   Arava [Leflunomide] Hives   Azithromycin Other (See Comments)    Severe bloating, abd pain   Cefuroxime Axetil Other (See Comments)    Increased bloating, abd pain   Clarithromycin Other (See Comments)    abd pain   Etanercept Hives and Other (See Comments)    Injection site irritation (ENBREL)   Levofloxacin Hives and Other (See Comments)    Severe dizziness   Sulfa Antibiotics Hives and Other (See Comments)    abd pain   Methotrexate Derivatives  Other (See Comments)    Injection ONLY (irritation at the site of injection)    FAMILY HISTORY: Family History  Problem Relation Age of Onset   Thyroid disease Mother    Heart attack Mother    Stroke Mother    Diabetes Mother    Brain cancer Mother    Colon cancer Father    Heart attack Sister    Hypertension Sister    Hypertension Brother       Objective:  *** General: No acute distress.  Patient appears well-groomed.   Head:  Normocephalic/atraumatic Eyes:  Fundi examined but not visualized Neck: supple, no paraspinal tenderness, full range of motion Heart:  Regular rate and rhythm Neurological Exam: alert and oriented to person, place, and time.  Speech fluent and not dysarthric, language intact.  CN II-XII intact. Bulk and tone normal, muscle strength 5/5 throughout.  Sensation to light  touch intact.  Deep tendon reflexes 2+ throughout, toes downgoing.  Finger to nose testing intact.  Gait normal, Romberg negative.   Metta Clines, DO  CC: Ephriam Jenkins

## 2022-11-05 ENCOUNTER — Telehealth (INDEPENDENT_AMBULATORY_CARE_PROVIDER_SITE_OTHER): Payer: BC Managed Care – PPO | Admitting: Neurology

## 2022-11-05 ENCOUNTER — Telehealth: Payer: BC Managed Care – PPO | Admitting: Neurology

## 2022-11-05 ENCOUNTER — Encounter: Payer: Self-pay | Admitting: Neurology

## 2022-11-05 DIAGNOSIS — G43009 Migraine without aura, not intractable, without status migrainosus: Secondary | ICD-10-CM

## 2022-11-05 MED ORDER — PROPRANOLOL HCL ER 60 MG PO CP24: ORAL_CAPSULE | ORAL | 3 refills | Status: DC

## 2022-11-05 NOTE — Progress Notes (Signed)
Virtual Visit via Video Note  Consent was obtained for video visit:  Yes.   Answered questions that patient had about telehealth interaction:  Yes.   I discussed the limitations, risks, security and privacy concerns of performing an evaluation and management service by telemedicine. I also discussed with the patient that there may be a patient responsible charge related to this service. The patient expressed understanding and agreed to proceed.  Pt location: Home Physician Location: office Name of referring provider:  Thea Alken, FNP I connected with Laurie Cherry at patients initiation/request on 11/05/2022 at 12:50 PM EST by video enabled telemedicine application and verified that I am speaking with the correct person using two identifiers. Pt MRN:  [id number] Pt DOB:  01/22/1961 Video Participants:  Laurie Cherry  Assessment and Plan:   Migraine without aura, without status migrainosus, not intractable    Migraine prevention:  Botox every 3 months, propranolol ER '60mg'$  daily Migraine rescue:  sumatriptan 50-'100mg'$  Limit use of pain relievers to no more than 2 days out of week to prevent risk of rebound or medication-overuse headache. Keep headache diary Follow up in one year for routine visit.   History of Present Illness:  Laurie Cherry is a 62 year old right-handed female with rheumatoid arthritis, hypothyroidism, reflux and mild depression who follows up for migraines.   UPDATE: Intensity:  mild to moderate Duration:  1 to 2 hours. Frequency:  usually 1-2 a month.  Increased headaches (2-3 a week) after getting sick  - she is getting over that now.   Current NSAIDS:  ibuprofen Current analgesics:  Excedrin Migraine Current triptans:  Sumatriptan '50mg'$  ('100mg'$  caused drowsiness) w/wo naproxen '500mg'$  Current ergotamine:  none Current anti-emetic:  none Current muscle relaxants:  none Current anti-anxiolytic:  none Current sleep aide:  none Current  Antihypertensive medications:  Propranolol ER '60mg'$  Current Antidepressant medications:  Celexa Current Anticonvulsant medications:  none Current anti-CGRP:  none Current Vitamins/Herbal/Supplements:  folic acid Current Antihistamines/Decongestants:  Claritin Other therapy:  Botox Hormone/birth control:  estradiol Other medications:  Evolocumab, methotrexate, Rinvoq   Caffeine:  1 cup of coffee daily Alcohol:  no Smoker:  no Diet:  1/2 can of soda daily.  Drinks water Exercise:  no Depression:  stable; Anxiety:  no Other pain:  no Sleep hygiene:  varies   HISTORY: She has a history of "hormonal" migraines most of her life, described as pounding right temporal pain and often associated with her menstrual cycle.  They have pretty much stabilized but she will still get it every now and then.     She was diagnosed with rheumatoid arthritis in 2014.  She had been on methotrexate for about 2 years (stopped a month ago).  In September 2015, she began having new headaches.  It coincided with change in her thyroid medications.  They are left sided, 7/10 non-throbbing pain.    They are associated with photophobia, phonophobia, and osmophobia but not nausea.   Initially, they typically lasted 3 to 4 days and occur 1 to 2 times a week (however she also has a dull daily headache) Stress is a trigger.  Change in thyroid medication may have been a trigger. These headaches do not correlate with her menstrual cycle.  Sumatriptan with naproxen helps relieve it.   Past NSAIDS:  no Past analgesics:  Excedrin, Tylenol (ineffective) Past abortive triptans:  no Past muscle relaxants:  no Past anti-emetic:  no Past antihypertensive medications:  atenolol ('25mg'$  ineffective and higher doses  caused dizziness) Past antidepressant medications:  venlafaxine XR '150mg'$  Past anticonvulsant medications:  topiramate '25mg'$  (caused drowsiness, but willing to retry it if needed) Past anti-CGRP:  Aimovig '70mg'$  (she was on  it for 2 months.  Ineffective) Past vitamins/Herbal/Supplements:  no Past antihistamines/decongestants:  no Other past therapies:  no   Family history of headache:  Mom, sister Mother had glioblastoma  Past Medical History: Past Medical History:  Diagnosis Date   Depression    Esophagitis    Family history of adverse reaction to anesthesia    Sister   Fibroids    GERD (gastroesophageal reflux disease)    Hypothyroidism    Rheumatoid arthritis ([place])    Rheumatoid arthritis ([place]) 01/20/2017   Worsening headaches     Medications: Outpatient Encounter Medications as of 11/05/2022  Medication Sig Note   Botulinum Toxin Type A (BOTOX) 200 units SOLR Inject 155 units IM into the multiple sites in the face neck and head once every 90 days    citalopram (CELEXA) 20 MG tablet Take 10 mg by mouth at bedtime.    estradiol (ESTRACE) 0.5 MG tablet Take 0.5 mg by mouth daily.    Evolocumab (REPATHA) 140 MG/ML SOSY INJECT '140MG'$  UNDER SKIN EVERY 2 WEEKS    folic acid (FOLVITE) 1 MG tablet Take 1 mg by mouth daily.     hydrocortisone (CORTEF) 5 MG tablet TAKE 2 TABLET BY MOUTH EVERY DAY AT 8 AM AND THEN TAKE 1 TABLET BY MOUTH EVERY DAY AT NOON    loratadine (CLARITIN) 10 MG tablet Take 10 mg by mouth at bedtime.     methotrexate (RHEUMATREX) 2.5 MG tablet Take 10 mg by mouth once a week. Caution:Chemotherapy. Protect from light. 04/04/2020: Either Wed or Friday   montelukast (SINGULAIR) 10 MG tablet TAKE 1 TABLET(10 MG) BY MOUTH AT BEDTIME    Multiple Vitamins-Minerals (IMMUNE SUPPORT PO) Take 1 Scoop by mouth daily.    propranolol ER (INDERAL LA) 60 MG 24 hr capsule TAKE 1 CAPSULE(60 MG) BY MOUTH DAILY    RABEprazole (ACIPHEX) 20 MG tablet TAKE 1 TABLET BY MOUTH EVERY MORNING 30 MINUTES BEFORE BREAKFAST    RINVOQ 15 MG TB24 Take 1 tablet by mouth daily.    rosuvastatin (CRESTOR) 5 MG tablet TAKE 1 TABLET(5 MG) BY MOUTH DAILY 11/05/2022: On hold for now.    SUMAtriptan (IMITREX) 100 MG tablet TAKE 1  TABLET BY MOUTH AT ONSET OF HEADACHE. MAY REPEAT IN 2 HOURS IF HEADACHE PERSISTS    TIROSINT 50 MCG CAPS TAKE 1 CAPSULE(50 MCG) BY MOUTH DAILY BEFORE BREAKFAST    Vitamin D, Ergocalciferol, (DRISDOL) 50000 units CAPS capsule Take 50,000 Units by mouth every Sunday.  06/29/2020: Filled   APPLE CIDER VINEGAR PO Take 2 capsules by mouth daily. (Patient not taking: Reported on 08/22/2022)    No facility-administered encounter medications on file as of 11/05/2022.    Allergies: Allergies  Allergen Reactions   Arava [Leflunomide] Hives   Azithromycin Other (See Comments)    Severe bloating, abd pain   Cefuroxime Axetil Other (See Comments)    Increased bloating, abd pain   Clarithromycin Other (See Comments)    abd pain   Etanercept Hives and Other (See Comments)    Injection site irritation (ENBREL)   Levofloxacin Hives and Other (See Comments)    Severe dizziness   Sulfa Antibiotics Hives and Other (See Comments)    abd pain   Methotrexate Derivatives Other (See Comments)    Injection ONLY (irritation at the  site of injection)    Family History: Family History  Problem Relation Age of Onset   Thyroid disease Mother    Heart attack Mother    Stroke Mother    Diabetes Mother    Brain cancer Mother    Colon cancer Father    Heart attack Sister    Hypertension Sister    Hypertension Brother     Observations/Objective:   No acute distress.  Alert and oriented.  Speech fluent and not dysarthric.  Language intact.    Follow Up Instructions:    -I discussed the assessment and treatment plan with the patient. The patient was provided an opportunity to ask questions and all were answered. The patient agreed with the plan and demonstrated an understanding of the instructions.   The patient was advised to call back or seek an in-person evaluation if the symptoms worsen or if the condition fails to improve as anticipated.   Dudley Major, DO

## 2022-11-07 ENCOUNTER — Other Ambulatory Visit: Payer: Self-pay

## 2022-11-07 MED ORDER — BOTOX 200 UNITS IJ SOLR: INTRAMUSCULAR | 4 refills | Status: DC

## 2022-11-07 NOTE — Telephone Encounter (Signed)
Refill sent [facility].

## 2022-11-25 ENCOUNTER — Telehealth: Payer: Self-pay | Admitting: "Endocrinology

## 2022-11-25 ENCOUNTER — Other Ambulatory Visit: Payer: Self-pay | Admitting: "Endocrinology

## 2022-11-25 LAB — LIPID PANEL
Chol/HDL Ratio: 3 ratio (ref 0.0–4.4)
Cholesterol, Total: 118 mg/dL (ref 100–199)
HDL: 40 mg/dL (ref >39)
LDL Chol Calc (NIH): 54 mg/dL (ref 0–99)
Triglycerides: 134 mg/dL (ref 0–149)
VLDL Cholesterol Cal: 24 mg/dL (ref 5–40)

## 2022-11-25 LAB — TSH: TSH: 4.49 u[IU]/mL (ref 0.450–4.500)

## 2022-11-25 LAB — VITAMIN D 25 HYDROXY (VIT D DEFICIENCY, FRACTURES): Vit D, 25-Hydroxy: 141.2 ng/mL — ABNORMAL HIGH (ref 30.0–100.0)

## 2022-11-25 LAB — T4, FREE: Free T4: 1.5 ng/dL (ref 0.82–1.77)

## 2022-11-25 NOTE — Telephone Encounter (Signed)
Pt stated she did labs 11/22/22 and her Vitamin D was high, she is now scheduled for an appt here on 12/30/22.  Does she need to redo her labs before that appointment.  She also stated that she came off Humira in October of 2023 and is now taking Rinvoq.

## 2022-11-26 NOTE — Telephone Encounter (Signed)
Left a message requesting pt return call to the office. ?

## 2022-11-26 NOTE — Telephone Encounter (Signed)
Will you please call pt

## 2022-11-27 ENCOUNTER — Encounter: Payer: Self-pay | Admitting: "Endocrinology

## 2022-11-28 ENCOUNTER — Ambulatory Visit (INDEPENDENT_AMBULATORY_CARE_PROVIDER_SITE_OTHER): Payer: BC Managed Care – PPO | Admitting: Neurology

## 2022-11-28 DIAGNOSIS — G43009 Migraine without aura, not intractable, without status migrainosus: Secondary | ICD-10-CM | POA: Diagnosis not present

## 2022-11-28 MED ORDER — ONABOTULINUMTOXINA 200 UNITS IJ SOLR
200.0000 [IU] | INTRAMUSCULAR | Status: AC
Start: 2022-11-28 — End: ?
  Administered 2022-11-28: 155 [IU] via INTRAMUSCULAR

## 2022-11-28 NOTE — Progress Notes (Signed)
[facility]  ° °Procedure Note Botox ° °Attending: Dr. Brogen Duell ° °Preoperative Diagnosis(es): Chronic migraine ° °Consent obtained from: The patient °Benefits discussed included, but were not limited to decreased muscle tightness, increased joint range of motion, and decreased pain.  Risk discussed included, but were not limited pain and discomfort, bleeding, bruising, excessive weakness, venous thrombosis, muscle atrophy and dysphagia.  Anticipated outcomes of the procedure as well as he risks and benefits of the alternatives to the procedure, and the roles and tasks of the personnel to be involved, were discussed with the patient, and the patient consents to the procedure and agrees to proceed. A copy of the patient medication guide was given to the patient which explains the blackbox warning. ° °Patients identity and treatment sites confirmed Yes.  . ° °Details of Procedure: °Skin was cleaned with alcohol. Prior to injection, the needle plunger was aspirated to make sure the needle was not within a blood vessel.  There was no blood retrieved on aspiration.   ° °Following is a summary of the muscles injected  And the amount of Botulinum toxin used: ° °Dilution °200 units of Botox was reconstituted with 4 ml of preservative free normal saline. °Time of reconstitution: At the time of the office visit (<30 minutes prior to injection)  ° °Injections  °155 total units of Botox was injected with a 30 gauge needle. ° °Injection Sites: °L occipitalis: 15 units- 3 sites  °R occiptalis: 15 units- 3 sites ° °L upper trapezius: 15 units- 3 sites °R upper trapezius: 15 units- 3 sits          °L paraspinal: 10 units- 2 sites °R paraspinal: 10 units- 2 sites ° °Face °L frontalis(2 injection sites):10 units   °R frontalis(2 injection sites):10 units         °L corrugator: 5 units   °R corrugator: 5 units           °Procerus: 5 units   °L temporalis: 20 units °R temporalis: 20 units  ° °Agent:  °200 units of botulinum Type  A (Onobotulinum Toxin type A) was reconstituted with 4 ml of preservative free normal saline.  °Time of reconstitution: At the time of the office visit (<30 minutes prior to injection)  ° ° ° Total injected (Units):  155 ° Total wasted (Units):  45 ° °Patient tolerated procedure well without complications.   °Reinjection is anticipated in 3 months. ° ° °

## 2022-12-05 ENCOUNTER — Ambulatory Visit (INDEPENDENT_AMBULATORY_CARE_PROVIDER_SITE_OTHER): Payer: BC Managed Care – PPO | Admitting: Gastroenterology

## 2022-12-07 ENCOUNTER — Other Ambulatory Visit: Payer: Self-pay | Admitting: "Endocrinology

## 2022-12-19 ENCOUNTER — Other Ambulatory Visit: Payer: Self-pay | Admitting: "Endocrinology

## 2022-12-23 ENCOUNTER — Encounter: Payer: Self-pay | Admitting: "Endocrinology

## 2022-12-24 ENCOUNTER — Other Ambulatory Visit: Payer: Self-pay | Admitting: "Endocrinology

## 2022-12-25 ENCOUNTER — Other Ambulatory Visit: Payer: Self-pay

## 2022-12-25 DIAGNOSIS — E039 Hypothyroidism, unspecified: Secondary | ICD-10-CM

## 2022-12-25 MED ORDER — TIROSINT 75 MCG PO CAPS: 1.0000 | ORAL_CAPSULE | ORAL | 0 refills | Status: DC

## 2022-12-31 ENCOUNTER — Encounter: Payer: Self-pay | Admitting: "Endocrinology

## 2022-12-31 ENCOUNTER — Ambulatory Visit: Payer: BC Managed Care – PPO | Admitting: "Endocrinology

## 2022-12-31 VITALS — BP 126/74 | HR 72 | Ht 62.0 in | Wt 127.6 lb

## 2022-12-31 DIAGNOSIS — E782 Mixed hyperlipidemia: Secondary | ICD-10-CM | POA: Diagnosis not present

## 2022-12-31 DIAGNOSIS — E039 Hypothyroidism, unspecified: Secondary | ICD-10-CM | POA: Diagnosis not present

## 2022-12-31 DIAGNOSIS — E2740 Unspecified adrenocortical insufficiency: Secondary | ICD-10-CM | POA: Diagnosis not present

## 2022-12-31 DIAGNOSIS — E673 Hypervitaminosis D: Secondary | ICD-10-CM | POA: Diagnosis not present

## 2022-12-31 NOTE — Progress Notes (Signed)
12/31/2022  Endocrinology follow-up note   Subjective:    Patient ID: Laurie Cherry, female    DOB: 14-Aug-1961, PCP Ephriam Jenkins E   Past Medical History:  Diagnosis Date   Depression    Esophagitis    Family history of adverse reaction to anesthesia    Sister   Fibroids    GERD (gastroesophageal reflux disease)    Hypothyroidism    Rheumatoid arthritis ([place])    Rheumatoid arthritis ([place]) 01/20/2017   Worsening headaches    Past Surgical History:  Procedure Laterality Date   ABDOMINAL HYSTERECTOMY N/A 04/03/2017   Procedure: TOTAL HYSTERECTOMY ABDOMINAL;  Surgeon: Louretta Shorten, MD;  Location: [facility];  Service: Gynecology;  Laterality: N/A;   BIOPSY  04/10/2018   Procedure: BIOPSY;  Surgeon: Rogene Houston, MD;  [facility]: [facility];  Service: Endoscopy;;  esophegus    BIOPSY  10/19/2018   Procedure: BIOPSY;  Surgeon: Rogene Houston, MD;  [facility];  Service: Endoscopy;;  esophageal    BIOPSY  04/12/2020   Procedure: BIOPSY;  Surgeon: Rogene Houston, MD;  [facility];  Service: Endoscopy;;   COLONOSCOPY     COLONOSCOPY     COLONOSCOPY N/A 04/10/2018   Procedure: COLONOSCOPY;  Surgeon: Rogene Houston, MD;  [facility];  Service: Endoscopy;  Laterality: N/A;   ESOPHAGEAL DILATION N/A 04/10/2018   Procedure: ESOPHAGEAL DILATION;  Surgeon: Rogene Houston, MD;  [facility];  Service: Endoscopy;  Laterality: N/A;  balloon dilation   ESOPHAGEAL DILATION N/A 10/19/2018   Procedure: ESOPHAGEAL DILATION;  Surgeon: Rogene Houston, MD;  [facility];  Service: Endoscopy;  Laterality: N/A;   ESOPHAGEAL DILATION N/A 04/12/2020   Procedure: ESOPHAGEAL DILATION;  Surgeon: Rogene Houston, MD;  [facility];  Service: Endoscopy;  Laterality: N/A;   ESOPHAGOGASTRODUODENOSCOPY     ESOPHAGOGASTRODUODENOSCOPY N/A 04/10/2018   Procedure: ESOPHAGOGASTRODUODENOSCOPY (EGD);  Surgeon: Rogene Houston, MD;   [facility];  Service: Endoscopy;  Laterality: N/A;  1:25   ESOPHAGOGASTRODUODENOSCOPY N/A 10/19/2018   Procedure: ESOPHAGOGASTRODUODENOSCOPY (EGD);  Surgeon: Rogene Houston, MD;  [facility];  Service: Endoscopy;  Laterality: N/A;  7:30   ESOPHAGOGASTRODUODENOSCOPY N/A 04/12/2020   Procedure: ESOPHAGOGASTRODUODENOSCOPY (EGD);  Surgeon: Rogene Houston, MD;  [facility];  Service: Endoscopy;  Laterality: N/A;  235   EYE SURGERY Right    SALPINGOOPHORECTOMY Bilateral 04/03/2017   Procedure: SALPINGO OOPHORECTOMY;  Surgeon: Louretta Shorten, MD;  Location: [facility];  Service: Gynecology;  Laterality: Bilateral;   Social History   Socioeconomic History   Marital status: Married    Spouse name: Not on file   Number of children: 1   Years of education: Not on file   Highest education level: Master's degree (e.g., MA, MS, MEng, MEd, MSW, MBA)  Occupational History   Not on file  Tobacco Use   Smoking status: Never   Smokeless tobacco: Never  Vaping Use   Vaping Use: Never used  Substance and Sexual Activity   Alcohol use: No    Alcohol/week: 0.0 standard drinks of alcohol   Drug use: No   Sexual activity: Not on file  Other Topics Concern   Not on file  Social History Narrative   Pt is married lives with spouse in 2 story home she has 1 children, Master Degree in Education   She is right handed, and drinks coffee once a  day, 1 soda a day and not tea   She does not work out at all   Social Determinants of Radio broadcast assistant Strain: Not on file  Food Insecurity: Not on file  Transportation Needs: Not on file  Physical Activity: Not on file  Stress: Not on file  Social Connections: Not on file   Outpatient Encounter Medications as of 60/02/2023  Medication Sig   Levothyroxine Sodium (TIROSINT) 75 MCG CAPS Take 75 mcg by mouth every other day.   botulinum toxin Type A (BOTOX) 200 units injection Inject 155 units IM into the multiple sites in  the face neck and head once every 90 days   citalopram (CELEXA) 20 MG tablet Take 10 mg by mouth at bedtime.   estradiol (ESTRACE) 0.5 MG tablet Take 0.5 mg by mouth daily.   folic acid (FOLVITE) 1 MG tablet Take 1 mg by mouth daily.    hydrocortisone (CORTEF) 5 MG tablet TAKE 2 TABLET BY MOUTH EVERY DAY AT 8 AM AND THEN TAKE 1 TABLET BY MOUTH EVERY DAY AT NOON   loratadine (CLARITIN) 10 MG tablet Take 10 mg by mouth at bedtime.    methotrexate (RHEUMATREX) 2.5 MG tablet Take 10 mg by mouth once a week. Caution:Chemotherapy. Protect from light.   montelukast (SINGULAIR) 10 MG tablet TAKE 1 TABLET(10 MG) BY MOUTH AT BEDTIME   Multiple Vitamins-Minerals (IMMUNE SUPPORT PO) Take 1 Scoop by mouth daily.   propranolol ER (INDERAL LA) 60 MG 24 hr capsule TAKE 1 CAPSULE(60 MG) BY MOUTH DAILY   RABEprazole (ACIPHEX) 20 MG tablet TAKE 1 TABLET BY MOUTH EVERY MORNING 30 MINUTES BEFORE BREAKFAST   REPATHA 140 MG/ML SOSY INJECT '140MG'$  UNDER SKIN EVERY 2 WEEKS   RINVOQ 15 MG TB24 Take 1 tablet by mouth daily.   rosuvastatin (CRESTOR) 5 MG tablet TAKE 1 TABLET(5 MG) BY MOUTH DAILY   SUMAtriptan (IMITREX) 100 MG tablet TAKE 1 TABLET BY MOUTH AT ONSET OF HEADACHE. MAY REPEAT IN 2 HOURS IF HEADACHE PERSISTS   TIROSINT 50 MCG CAPS TAKE 1 CAPSULE(50 MCG) BY MOUTH DAILY BEFORE BREAKFAST (Patient taking differently: TAKE 1 CAPSULE(50 MCG) BY MOUTH EVERY OTHER DAY BEFORE BREAKFAST)   [DISCONTINUED] APPLE CIDER VINEGAR PO Take 2 capsules by mouth daily. (Patient not taking: Reported on 08/22/2022)   [DISCONTINUED] Levothyroxine Sodium (TIROSINT) 50 MCG CAPS Take 1 capsule (50 mcg total) by mouth daily before breakfast.   [DISCONTINUED] TIROSINT 75 MCG CAPS Take 1 capsule (75 mcg total) by mouth every other day.   Facility-Administered Encounter Medications as of 12/31/2022  Medication   botulinum toxin Type A (BOTOX) injection 200 Units   ALLERGIES: Allergies  Allergen Reactions   Arava [Leflunomide] Hives    Azithromycin Other (See Comments)    Severe bloating, abd pain   Cefuroxime Axetil Other (See Comments)    Increased bloating, abd pain   Clarithromycin Other (See Comments)    abd pain   Etanercept Hives and Other (See Comments)    Injection site irritation (ENBREL)   Levofloxacin Hives and Other (See Comments)    Severe dizziness   Sulfa Antibiotics Hives and Other (See Comments)    abd pain   Methotrexate Derivatives Other (See Comments)    Injection ONLY (irritation at the site of injection)   VACCINATION STATUS:  There is no immunization history on file for this patient.  HPI  62 year old female patient with medical history as above.  She is being seen in follow-up for hypothyroidism, renal  insufficiency, hyperlipidemia. This visit is due to her recent adrenal assessment which confirms adrenal insufficiency.  See lab results below.  Patient has significant exposure to steroids over the years due to her autoimmune conditions. ACTH stimulation test confirmed possibility of primary adrenal insufficiency.  21 hydroxylase enzyme antibody was negative.  She states that she was diagnosed with hypothyroidism approximately at age 89.  She has no new complaints.    She still does not tolerate Crestor.  However she has tolerated Repatha which helped her lower her LDL to 54 from 138.   - She has family  history of thyroid dysfunction in multiple members of her family.  She denies any history of goiter. She denies any family history of thyroid cancer. - She underwent total abdominal hysterectomy/ salphigo oophorectomy due to a large ovarian cyst. Findings were reportedly benign.  -She is on multiple medications for her rheumatoid arthritis.   Review of Systems Limited as above.  Objective:    BP 126/74   Pulse 72   Ht '5\' 2"'$  (1.575 m)   Wt 127 lb 9.6 oz (57.9 kg)   LMP 03/31/2017   BMI 23.34 kg/m   Wt Readings from Last 3 Encounters:  12/31/22 127 lb 9.6 oz (57.9 kg)  08/22/22  132 lb 9.6 oz (60.1 kg)  03/26/22 135 lb 3.2 oz (61.3 kg)    Physical Exam    Recent Results (from the past 2160 hour(s))  TSH     Status: None   Collection Time: 11/22/22  8:39 AM  Result Value Ref Range   TSH 4.490 0.450 - 4.500 uIU/mL  T4, free     Status: None   Collection Time: 11/22/22  8:39 AM  Result Value Ref Range   Free T4 1.50 0.82 - 1.77 ng/dL  Lipid panel     Status: None   Collection Time: 11/22/22  8:39 AM  Result Value Ref Range   Cholesterol, Total 118 100 - 199 mg/dL   Triglycerides 134 0 - 149 mg/dL   HDL 40 >39 mg/dL   VLDL Cholesterol Cal 24 5 - 40 mg/dL   LDL Chol Calc (NIH) 54 0 - 99 mg/dL   Chol/HDL Ratio 3.0 0.0 - 4.4 ratio    Comment:                                   T. Chol/HDL Ratio                                             Men  Women                               1/2 Avg.Risk  3.4    3.3                                   Avg.Risk  5.0    4.4                                2X Avg.Risk  9.6    7.1  3X Avg.Risk 23.4   11.0   VITAMIN D 25 Hydroxy (Vit-D Deficiency, Fractures)     Status: Abnormal   Collection Time: 11/22/22  8:39 AM  Result Value Ref Range   Vit D, 25-Hydroxy 141.2 (H) 30.0 - 100.0 ng/mL    Comment: **Results verified by repeat testing** Vitamin D deficiency has been defined by the [place] practice guideline as a level of serum 25-OH vitamin D less than 20 ng/mL (1,2). The Endocrine Society went on to further define vitamin D insufficiency as a level between 21 and 29 ng/mL (2). 1. IOM (Institute of Medicine). 2010. Dietary reference    intakes for calcium and D. [place]: The    [employer]. 2. Holick MF, Binkley [place], Bischoff-Ferrari HA, et al.    Evaluation, treatment, and prevention of vitamin D    deficiency: an Endocrine Society clinical practice    guideline. JCEM. 2011 Jul; 96(7):1911-30.     - On 08/16/2016 her free T4 was  1.36, improving from prior studies. - On 05/09/2016: Free T4 0.63, TSH 7.58, TPO antibodies 13  Assessment & Plan:   Adrenal insufficiency  2.  Hypothyroidism  3.  Dyslipidemia I reviewed her recent labs assessing adrenal insufficiency.  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  Unlikely to be secondary to Addison's disease- now that she has negative antibodies for 21-hydroxylase .  -She is advised to continue hydrocortisone 10 AM, 5 mg p.o. daily at noon.  Sick day rules discussed with her.  She will likely need glucocorticoid replacement for life.  She will wear a medical alert depicting adrenal insufficiency.   Her blood pressure is stable, will not need mineralocorticoid replacement at this time.   -Her Tirosint was recently adjusted -  Advised her to take Tirosint 50 mcg on alternate days while taking 75 mcg on the other alternate days.    - We discussed about the correct intake of her thyroid hormone, on empty stomach at fasting, with water, separated by at least 30 minutes from breakfast and other medications,  and separated by more than 4 hours from calcium, iron, multivitamins, acid reflux medications (PPIs). -Patient is made aware of the fact that thyroid hormone replacement is needed for life, dose to be adjusted by periodic monitoring of thyroid function tests.   -She has no clinical goiter, or thyroid ultrasound from July 2019 was unremarkable except for shrunk bilateral thyroid lobes.    2.  Hyperlipidemia She has responded to her Repatha injections.  Her LDL is improving from 1 38-54.  She will continue to benefit from this intervention.  She is advised to continue Repatha 140 mg subcutaneously every 2 weeks.  He would still benefit from lifestyle medicine.  - she acknowledges that there is a room for improvement in her food and drink choices. - Suggestion is made for her to avoid simple carbohydrates  from her diet  including Cakes, Sweet Desserts, Ice Cream, Soda (diet and regular), Sweet Tea, Candies, Chips, Cookies, Store Bought Juices, Alcohol , Artificial Sweeteners,  Coffee Creamer, and "Sugar-free" Products, Lemonade. This will help patient to have more stable blood glucose profile and potentially avoid unintended weight gain.  The following Lifestyle Medicine recommendations according to [facility]) were discussed and and offered to patient and she  agrees to start the journey:  A. Whole Foods, Plant-Based Nutrition comprising of fruits and vegetables,  plant-based proteins, whole-grain carbohydrates was discussed in detail with the patient.   A list for source of those nutrients were also provided to the patient.  Patient will use only water or unsweetened tea for hydration. B.  The need to stay away from risky substances including alcohol, smoking; obtaining 7 to 9 hours of restorative sleep, at least 150 minutes of moderate intensity exercise weekly, the importance of healthy social connections,  and stress management techniques were discussed. C.  A full color page of  Calorie density of various food groups per pound showing examples of each food groups was provided to the patient.  Her labs did show extensive vitamin D, advised to discontinue all vitamin D supplements for now.  - I advised patient to maintain close follow up with Ephriam Jenkins E for primary care needs.   I spent  26  minutes in the care of the patient today including review of labs from Thyroid Function, CMP, and other relevant labs ; imaging/biopsy records (current and previous including abstractions from other facilities); face-to-face time discussing  her lab results and symptoms, medications doses, her options of short and long term treatment based on the latest standards of care / guidelines;   and documenting the encounter.  Zane Herald  participated in the discussions, expressed  understanding, and voiced agreement with the above plans.  All questions were answered to her satisfaction. she is encouraged to contact clinic should she have any questions or concerns prior to her return visit.   Follow up plan: Return in about 6 months (around 07/03/2023) for Fasting Labs  in AM B4 8.  Glade Lloyd, MD Phone: [phone]  Fax: [phone]   12/31/2022, 6:38 PM

## 2023-01-01 ENCOUNTER — Encounter: Payer: Self-pay | Admitting: "Endocrinology

## 2023-01-17 ENCOUNTER — Other Ambulatory Visit: Payer: Self-pay | Admitting: "Endocrinology

## 2023-01-22 ENCOUNTER — Telehealth: Payer: Self-pay

## 2023-01-22 ENCOUNTER — Encounter: Payer: Self-pay | Admitting: "Endocrinology

## 2023-01-22 NOTE — Telephone Encounter (Signed)
Pt called stating her insurance is requiring a prior authorization for her hydrocortisone. Pt states she is out of her medication.

## 2023-01-27 ENCOUNTER — Other Ambulatory Visit (HOSPITAL_COMMUNITY): Payer: Self-pay

## 2023-01-28 ENCOUNTER — Other Ambulatory Visit (HOSPITAL_COMMUNITY): Payer: Self-pay

## 2023-01-28 NOTE — Telephone Encounter (Signed)
Spoke with pharmacist with [facility] in [place] Redgie Grayer), she stated she tried to send claim through but continued to received message that a PA is needed for the hydrocortisone. States she also called pt's insurance and was told the hydrocortisone needs a PA. States they have pt's same insurance information.

## 2023-01-28 NOTE — Telephone Encounter (Signed)
Spoke with pt and made her aware.

## 2023-01-28 NOTE — Telephone Encounter (Signed)
Spoke with pharmacist Redgie Grayer) at [facility] in [place], asked her which [facility] they were using. Sometimes a medication is covered, but only specific brands/NDCs, seems like that was the case here. I asked her to change the [facility] and we received a paid claim. Medication is being ordered for the pt.

## 2023-01-28 NOTE — Telephone Encounter (Signed)
Thank you so much for your help!

## 2023-01-28 NOTE — Telephone Encounter (Signed)
Patient Advocate Encounter   Received notification from pt msgs that prior authorization is required for hydrocortisone 5mg .  Test claim shows that no PA is needed.    Copay is $15

## 2023-02-06 ENCOUNTER — Encounter: Payer: Self-pay | Admitting: "Endocrinology

## 2023-02-25 ENCOUNTER — Encounter (INDEPENDENT_AMBULATORY_CARE_PROVIDER_SITE_OTHER): Payer: Self-pay | Admitting: Gastroenterology

## 2023-02-25 ENCOUNTER — Ambulatory Visit (INDEPENDENT_AMBULATORY_CARE_PROVIDER_SITE_OTHER): Payer: BC Managed Care – PPO | Admitting: Gastroenterology

## 2023-02-25 VITALS — BP 134/75 | HR 80 | Temp 97.5°F | Ht 62.0 in | Wt 128.5 lb

## 2023-02-25 DIAGNOSIS — K2100 Gastro-esophageal reflux disease with esophagitis, without bleeding: Secondary | ICD-10-CM | POA: Diagnosis not present

## 2023-02-25 MED ORDER — FAMOTIDINE 20 MG PO TABS: 20.0000 mg | ORAL_TABLET | Freq: Every day | ORAL | 3 refills | Status: DC

## 2023-02-25 NOTE — Patient Instructions (Addendum)
You can try stopping your Rabeprazole and starting famotidine 20mg  once daily, I would try this for 2-3 weeks, if you are having GERD symptoms, you could go back to aciphex daily or even every other day.  Be mindful of greasy, spicy, fried, citrus foods, caffeine, carbonated drinks, chocolate and alcohol as these can increase reflux symptoms Stay upright 2-3 hours after eating, prior to lying down and avoid eating late in the evenings.  Follow up 1 year   It was a pleasure to see you today. I want to create trusting relationships with patients and provide genuine, compassionate, and quality care. I truly value your feedback! please be on the lookout for a survey regarding your visit with me today. I appreciate your input about our visit and your time in completing this!    Senora Lacson L. Jeanmarie Hubert, MSN, APRN, AGNP-C Adult-Gerontology Nurse Practitioner [facility] at [facility]

## 2023-02-25 NOTE — Progress Notes (Addendum)
Referring Provider: Garald Braver, FNP Primary Care Physician:  Garald Braver Primary GI Physician: Levon Hedger   Chief Complaint  Patient presents with   Follow-up    Patient here today for a yearly follow up appointment. Per patient she has no current gi issues. She says her Genella Rife is controlled by RABEprazole 20 mg daily. She has used famotidine Qhs prn.   HPI:   Laurie Cherry is a 62 y.o. female with past medical history of erosive reflux esophagitis and EOE, RA, hypothyroidism, depression    Patient presenting today for follow up of GERD.   At last visit in February 2023, doing pretty well on Aciphex once daily  but she does notice sometimes during the night, some acid coming back up in her throat, this occurs maybe 2-3 days per week. She can usually take a sip of water which helps and then go back to sleep, she has no heartburn or discomfort with this. She sleeps with HOB elevated and stays upright a few hours after dinner, prior to lying down for bed. having some constipation which she is taking metamucil for, she feels that this has helped and is having 1 BM per day now.   Recommend continuing aciphex once daily, famotidine 20mg  in the evening, Reflux precautions.  Present:  She is continued on aciphex 20mg  daily. She notes that she took famotidine in the evenings for a while. She ran out and then did not notice any difference so she did not go back on it. She denies any breakthrough symptoms. She has tried to change her diet due to her cholesterol and wonders if this has helped with her GERD. She has had some weight loss with change in her diet. No red flag symptoms. Patient denies melena, hematochezia, nausea, vomiting, diarrhea, constipation, dysphagia, odyonophagia, early satiety or weight loss. She queries if she can try just taking famotidine and stop her PPI.   Last Colonoscopy:04/10/18- The entire examined colon is normal. - Two small anal papillae. - No specimens  collected. Last Endoscopy:04/12/20- Normal hypopharynx. - Normal proximal esophagus. - Esophageal mucosal changes secondary to eosinophilic esophagitis. Biopsied but neg for EoE. - Benign-appearing esophageal stenosis. Dilated. - 2 cm hiatal hernia. - Normal stomach. - Normal duodenal bulb and second portion of the duodenum.   Recommendations:  Repeat colonoscopy in 2029   Past Medical History:  Diagnosis Date   Depression    Esophagitis    Family history of adverse reaction to anesthesia    Sister   Fibroids    GERD (gastroesophageal reflux disease)    Hypothyroidism    Rheumatoid arthritis (HCC)    Rheumatoid arthritis (HCC) 01/20/2017   Worsening headaches     Past Surgical History:  Procedure Laterality Date   ABDOMINAL HYSTERECTOMY N/A 04/03/2017   Procedure: TOTAL HYSTERECTOMY ABDOMINAL;  Surgeon: Candice Camp, MD;  Location: [facility];  Service: Gynecology;  Laterality: N/A;   BIOPSY  04/10/2018   Procedure: BIOPSY;  Surgeon: Malissa Hippo, MD;  Location: [facility];  Service: Endoscopy;;  esophegus    BIOPSY  10/19/2018   Procedure: BIOPSY;  Surgeon: Malissa Hippo, MD;  [facility]: [facility];  Service: Endoscopy;;  esophageal    BIOPSY  04/12/2020   Procedure: BIOPSY;  Surgeon: Malissa Hippo, MD;  Location: [facility];  Service: Endoscopy;;   COLONOSCOPY     COLONOSCOPY     COLONOSCOPY N/A 04/10/2018   Procedure: COLONOSCOPY;  Surgeon: Malissa Hippo, MD;  [facility]: [facility];  Service: Endoscopy;  Laterality: N/A;   ESOPHAGEAL DILATION N/A 04/10/2018   Procedure: ESOPHAGEAL DILATION;  Surgeon: Malissa Hippo, MD;  [facility]: [facility];  Service: Endoscopy;  Laterality: N/A;  balloon dilation   ESOPHAGEAL DILATION N/A 10/19/2018   Procedure: ESOPHAGEAL DILATION;  Surgeon: Malissa Hippo, MD;  [facility]: [facility];  Service: Endoscopy;  Laterality: N/A;   ESOPHAGEAL DILATION N/A 04/12/2020   Procedure: ESOPHAGEAL DILATION;  Surgeon: Malissa Hippo, MD;  [facility];  Service: Endoscopy;  Laterality: N/A;   ESOPHAGOGASTRODUODENOSCOPY     ESOPHAGOGASTRODUODENOSCOPY N/A 04/10/2018   Procedure: ESOPHAGOGASTRODUODENOSCOPY (EGD);  Surgeon: Malissa Hippo, MD;  [facility];  Service: Endoscopy;  Laterality: N/A;  1:25   ESOPHAGOGASTRODUODENOSCOPY N/A 10/19/2018   Procedure: ESOPHAGOGASTRODUODENOSCOPY (EGD);  Surgeon: Malissa Hippo, MD;  [facility];  Service: Endoscopy;  Laterality: N/A;  7:30   ESOPHAGOGASTRODUODENOSCOPY N/A 04/12/2020   Procedure: ESOPHAGOGASTRODUODENOSCOPY (EGD);  Surgeon: Malissa Hippo, MD;  [facility]: [facility];  Service: Endoscopy;  Laterality: N/A;  235   EYE SURGERY Right    SALPINGOOPHORECTOMY Bilateral 04/03/2017   Procedure: SALPINGO OOPHORECTOMY;  Surgeon: Candice Camp, MD;  Location: [facility];  Service: Gynecology;  Laterality: Bilateral;    Current Outpatient Medications  Medication Sig Dispense Refill   botulinum toxin Type A (BOTOX) 200 units injection Inject 155 units IM into the multiple sites in the face neck and head once every 90 days 1 each 4   citalopram (CELEXA) 20 MG tablet Take 10 mg by mouth at bedtime.  3   estradiol (ESTRACE) 0.5 MG tablet Take 0.5 mg by mouth daily.     folic acid (FOLVITE) 1 MG tablet Take 1 mg by mouth daily.      hydrocortisone (CORTEF) 5 MG tablet TAKE 2 TABLETS BY MOUTH EVERY DAY AT 8AM AND THEN TAKE 1 TABLET BY MOUTH EVERY DAY AT NOON 270 tablet 0   Levothyroxine Sodium (TIROSINT) 75 MCG CAPS Take 75 mcg by mouth every other day.     loratadine (CLARITIN) 10 MG tablet Take 10 mg by mouth at bedtime.      methotrexate (RHEUMATREX) 2.5 MG tablet Take 10 mg by mouth once a week. Caution:Chemotherapy. Protect from light.     montelukast (SINGULAIR) 10 MG tablet TAKE 1 TABLET(10 MG) BY MOUTH AT BEDTIME 90 tablet 3   Multiple Vitamins-Minerals (IMMUNE SUPPORT PO) Take 1 Scoop by mouth daily.     propranolol ER (INDERAL LA) 60 MG  24 hr capsule TAKE 1 CAPSULE(60 MG) BY MOUTH DAILY 90 capsule 3   RABEprazole (ACIPHEX) 20 MG tablet TAKE 1 TABLET BY MOUTH EVERY MORNING 30 MINUTES BEFORE BREAKFAST 90 tablet 3   REPATHA 140 MG/ML SOSY INJECT 140MG  UNDER SKIN EVERY 2 WEEKS 2 mL 2   RINVOQ 15 MG TB24 Take 1 tablet by mouth daily.     rosuvastatin (CRESTOR) 5 MG tablet TAKE 1 TABLET(5 MG) BY MOUTH DAILY 30 tablet 2   SUMAtriptan (IMITREX) 100 MG tablet TAKE 1 TABLET BY MOUTH AT ONSET OF HEADACHE. MAY REPEAT IN 2 HOURS IF HEADACHE PERSISTS 10 tablet 3   TIROSINT 50 MCG CAPS TAKE 1 CAPSULE(50 MCG) BY MOUTH DAILY BEFORE BREAKFAST (Patient taking differently: TAKE 1 CAPSULE(50 MCG) BY MOUTH EVERY OTHER DAY BEFORE BREAKFAST) 30 capsule 2   Current Facility-Administered Medications  Medication Dose Route Frequency Provider Last Rate Last Admin   botulinum toxin Type A (  BOTOX) injection 200 Units  200 Units Intramuscular Q90 days Drema Dallas, DO   155 Units at 11/28/22 1153    Allergies as of 02/25/2023 - Review Complete 02/25/2023  Allergen Reaction Noted   Arava [leflunomide] Hives 10/27/2015   Azithromycin Other (See Comments) 10/27/2015   Cefuroxime axetil Other (See Comments) 10/27/2015   Clarithromycin Other (See Comments) 10/27/2015   Etanercept Hives and Other (See Comments) 10/27/2015   Levofloxacin Hives and Other (See Comments) 10/27/2015   Sulfa antibiotics Hives and Other (See Comments) 10/27/2015   Methotrexate derivatives Other (See Comments) 10/27/2015    Family History  Problem Relation Age of Onset   Thyroid disease Mother    Heart attack Mother    Stroke Mother    Diabetes Mother    Brain cancer Mother    Colon cancer Father    Heart attack Sister    Hypertension Sister    Hypertension Brother     Social History   Socioeconomic History   Marital status: Married    Spouse name: Not on file   Number of children: 1   Years of education: Not on file   Highest education level: Master's degree  (e.g., MA, MS, MEng, MEd, MSW, MBA)  Occupational History   Not on file  Tobacco Use   Smoking status: Never   Smokeless tobacco: Never  Vaping Use   Vaping Use: Never used  Substance and Sexual Activity   Alcohol use: No    Alcohol/week: 0.0 standard drinks of alcohol   Drug use: No   Sexual activity: Not on file  Other Topics Concern   Not on file  Social History Narrative   Pt is married lives with spouse in 2 story home she has 1 children, Master Degree in Education   She is right handed, and drinks coffee once a day, 1 soda a day and not tea   She does not work out at all   Social Determinants of Corporate investment banker Strain: Not on file  Food Insecurity: Not on file  Transportation Needs: Not on file  Physical Activity: Not on file  Stress: Not on file  Social Connections: Not on file    Review of systems General: negative for malaise, night sweats, fever, chills, weight loss Neck: Negative for lumps, goiter, pain and significant neck swelling Resp: Negative for cough, wheezing, dyspnea at rest CV: Negative for chest pain, leg swelling, palpitations, orthopnea GI: denies melena, hematochezia, nausea, vomiting, diarrhea, constipation, dysphagia, odyonophagia, early satiety or unintentional weight loss.  MSK: Negative for joint pain or swelling, back pain, and muscle pain. Derm: Negative for itching or rash Psych: Denies depression, anxiety, memory loss, confusion. No homicidal or suicidal ideation.  Heme: Negative for prolonged bleeding, bruising easily, and swollen nodes. Endocrine: Negative for cold or heat intolerance, polyuria, polydipsia and goiter. Neuro: negative for tremor, gait imbalance, syncope and seizures. The remainder of the review of systems is noncontributory.  Physical Exam: BP (!) 143/77 (BP Location: Left Arm, Patient Position: Sitting, Cuff Size: Normal)   Pulse 76   Temp (!) 97.5 F (36.4 C) (Temporal)   Ht 5\' 2"  (1.575 m)   Wt 128 lb  8 oz (58.3 kg)   LMP 03/31/2017   BMI 23.50 kg/m  General:   Alert and oriented. No distress noted. Pleasant and cooperative.  Head:  Normocephalic and atraumatic. Eyes:  Conjuctiva clear without scleral icterus. Mouth:  Oral mucosa pink and moist. Good dentition. No lesions. Heart:  Normal rate and rhythm, s1 and s2 heart sounds present.  Lungs: Clear lung sounds in all lobes. Respirations equal and unlabored. Abdomen:  +BS, soft, non-tender and non-distended. No rebound or guarding. No HSM or masses noted. Derm: No palmar erythema or jaundice Msk:  Symmetrical without gross deformities. Normal posture. Extremities:  Without edema. Neurologic:  Alert and  oriented x4 Psych:  Alert and cooperative. Normal mood and affect.  Invalid input(s): "6 MONTHS"   ASSESSMENT: YANIXA INGRAM is a 62 y.o. female presenting today for follow up of GERD.  Patient doing well on low dose aciphex without breakthrough. She wants to try stopping PPI and trying just H2B which I think is reasonable given how well her symptoms are controlled. She can try taking famotidine 20mg  daily, however, if she begins to have breakthrough GERD symptoms I advised her to go back to aciphex 20mg , she can even try taking this every other day. Should continue with good reflux precautions.    PLAN:  Can trial off of PPI for 2-3 weeks 2. Famotidine 20mg  daily  3. If GERD is not well controlled, will need to resume PPI, every other day/every day   All questions were answered, patient verbalized understanding and is in agreement with plan as outlined above.   Follow Up: 1 year   Ryland Smoots L. Jeanmarie Hubert, MSN, APRN, AGNP-C Adult-Gerontology Nurse Practitioner [facility]  I have reviewed the note and agree with the APP's assessment as described in this progress note  If recurrent GERD can try restarting PPI or can discuss TIF candidacy.  Katrinka Blazing, MD Gastroenterology and Hepatology [facility]

## 2023-02-28 ENCOUNTER — Other Ambulatory Visit: Payer: Self-pay | Admitting: "Endocrinology

## 2023-03-07 ENCOUNTER — Ambulatory Visit: Payer: BC Managed Care – PPO | Admitting: Neurology

## 2023-03-07 ENCOUNTER — Other Ambulatory Visit: Payer: Self-pay

## 2023-03-07 ENCOUNTER — Telehealth: Payer: Self-pay

## 2023-03-07 ENCOUNTER — Other Ambulatory Visit (HOSPITAL_COMMUNITY): Payer: Self-pay

## 2023-03-07 DIAGNOSIS — G43709 Chronic migraine without aura, not intractable, without status migrainosus: Secondary | ICD-10-CM

## 2023-03-07 MED ORDER — ONABOTULINUMTOXINA 100 UNITS IJ SOLR
200.0000 [IU] | Freq: Once | INTRAMUSCULAR | Status: AC
  Administered 2023-03-07: 155 [IU] via INTRAMUSCULAR

## 2023-03-07 NOTE — Telephone Encounter (Signed)
Sent PA team a request for PA for [employer]

## 2023-03-10 NOTE — Progress Notes (Signed)
[facility]  ° °Procedure Note Botox ° °Attending: Dr. Lynniah Janoski ° °Preoperative Diagnosis(es): Chronic migraine ° °Consent obtained from: The patient °Benefits discussed included, but were not limited to decreased muscle tightness, increased joint range of motion, and decreased pain.  Risk discussed included, but were not limited pain and discomfort, bleeding, bruising, excessive weakness, venous thrombosis, muscle atrophy and dysphagia.  Anticipated outcomes of the procedure as well as he risks and benefits of the alternatives to the procedure, and the roles and tasks of the personnel to be involved, were discussed with the patient, and the patient consents to the procedure and agrees to proceed. A copy of the patient medication guide was given to the patient which explains the blackbox warning. ° °Patients identity and treatment sites confirmed Yes.  . ° °Details of Procedure: °Skin was cleaned with alcohol. Prior to injection, the needle plunger was aspirated to make sure the needle was not within a blood vessel.  There was no blood retrieved on aspiration.   ° °Following is a summary of the muscles injected  And the amount of Botulinum toxin used: ° °Dilution °200 units of Botox was reconstituted with 4 ml of preservative free normal saline. °Time of reconstitution: At the time of the office visit (<30 minutes prior to injection)  ° °Injections  °155 total units of Botox was injected with a 30 gauge needle. ° °Injection Sites: °L occipitalis: 15 units- 3 sites  °R occiptalis: 15 units- 3 sites ° °L upper trapezius: 15 units- 3 sites °R upper trapezius: 15 units- 3 sits          °L paraspinal: 10 units- 2 sites °R paraspinal: 10 units- 2 sites ° °Face °L frontalis(2 injection sites):10 units   °R frontalis(2 injection sites):10 units         °L corrugator: 5 units   °R corrugator: 5 units           °Procerus: 5 units   °L temporalis: 20 units °R temporalis: 20 units  ° °Agent:  °200 units of botulinum Type  A (Onobotulinum Toxin type A) was reconstituted with 4 ml of preservative free normal saline.  °Time of reconstitution: At the time of the office visit (<30 minutes prior to injection)  ° ° ° Total injected (Units):  155 ° Total wasted (Units):  45 ° °Patient tolerated procedure well without complications.   °Reinjection is anticipated in 3 months. ° ° °

## 2023-03-18 ENCOUNTER — Other Ambulatory Visit (INDEPENDENT_AMBULATORY_CARE_PROVIDER_SITE_OTHER): Payer: Self-pay | Admitting: Gastroenterology

## 2023-03-29 ENCOUNTER — Other Ambulatory Visit: Payer: Self-pay | Admitting: "Endocrinology

## 2023-05-30 ENCOUNTER — Other Ambulatory Visit: Payer: Self-pay | Admitting: "Endocrinology

## 2023-06-13 ENCOUNTER — Other Ambulatory Visit (INDEPENDENT_AMBULATORY_CARE_PROVIDER_SITE_OTHER): Payer: Self-pay | Admitting: Gastroenterology

## 2023-06-13 ENCOUNTER — Ambulatory Visit: Payer: BC Managed Care – PPO | Admitting: Neurology

## 2023-06-13 DIAGNOSIS — K2100 Gastro-esophageal reflux disease with esophagitis, without bleeding: Secondary | ICD-10-CM

## 2023-06-13 DIAGNOSIS — G43709 Chronic migraine without aura, not intractable, without status migrainosus: Secondary | ICD-10-CM | POA: Diagnosis not present

## 2023-06-13 MED ORDER — ONABOTULINUMTOXINA 100 UNITS IJ SOLR
200.0000 [IU] | Freq: Once | INTRAMUSCULAR | Status: AC
Start: 2023-06-13 — End: 2023-06-13
  Administered 2023-06-13: 155 [IU] via INTRAMUSCULAR

## 2023-06-13 NOTE — Progress Notes (Signed)

## 2023-07-07 ENCOUNTER — Encounter: Payer: Self-pay | Admitting: "Endocrinology

## 2023-07-07 ENCOUNTER — Ambulatory Visit: Payer: BC Managed Care – PPO | Admitting: "Endocrinology

## 2023-07-07 VITALS — BP 132/76 | HR 72 | Ht 62.0 in | Wt 130.0 lb

## 2023-07-07 DIAGNOSIS — E039 Hypothyroidism, unspecified: Secondary | ICD-10-CM

## 2023-07-07 DIAGNOSIS — E2740 Unspecified adrenocortical insufficiency: Secondary | ICD-10-CM | POA: Diagnosis not present

## 2023-07-07 DIAGNOSIS — E782 Mixed hyperlipidemia: Secondary | ICD-10-CM

## 2023-07-07 DIAGNOSIS — E673 Hypervitaminosis D: Secondary | ICD-10-CM | POA: Diagnosis not present

## 2023-07-07 MED ORDER — REPATHA 140 MG/ML ~~LOC~~ SOSY: 140.0000 mg | PREFILLED_SYRINGE | SUBCUTANEOUS | 1 refills | Status: DC

## 2023-07-07 NOTE — Progress Notes (Signed)
07/07/2023  Endocrinology follow-up note   Subjective:    Patient ID: Laurie Cherry, female    DOB: [birth year], PCP Laurie Cherry   Past Medical History:  Diagnosis Date   Depression    Esophagitis    Family history of adverse reaction to anesthesia    Sister   Fibroids    GERD (gastroesophageal reflux disease)    Hypothyroidism    Rheumatoid arthritis (HCC)    Rheumatoid arthritis (HCC) 01/20/2017   Worsening headaches    Past Surgical History:  Procedure Laterality Date   ABDOMINAL HYSTERECTOMY N/A 04/03/2017   Procedure: TOTAL HYSTERECTOMY ABDOMINAL;  Surgeon: Candice Camp, MD;  Location: [facility];  Service: Gynecology;  Laterality: N/A;   BIOPSY  04/10/2018   Procedure: BIOPSY;  Surgeon: Malissa Hippo, MD;  [facility];  Service: Endoscopy;;  esophegus    BIOPSY  10/19/2018   Procedure: BIOPSY;  Surgeon: Malissa Hippo, MD;  [facility];  Service: Endoscopy;;  esophageal    BIOPSY  04/12/2020   Procedure: BIOPSY;  Surgeon: Malissa Hippo, MD;  [facility];  Service: Endoscopy;;   COLONOSCOPY     COLONOSCOPY     COLONOSCOPY N/A 04/10/2018   Procedure: COLONOSCOPY;  Surgeon: Malissa Hippo, MD;  [facility];  Service: Endoscopy;  Laterality: N/A;   ESOPHAGEAL DILATION N/A 04/10/2018   Procedure: ESOPHAGEAL DILATION;  Surgeon: Malissa Hippo, MD;  [facility];  Service: Endoscopy;  Laterality: N/A;  balloon dilation   ESOPHAGEAL DILATION N/A 10/19/2018   Procedure: ESOPHAGEAL DILATION;  Surgeon: Malissa Hippo, MD;  [facility];  Service: Endoscopy;  Laterality: N/A;   ESOPHAGEAL DILATION N/A 04/12/2020   Procedure: ESOPHAGEAL DILATION;  Surgeon: Malissa Hippo, MD;  [facility];  Service: Endoscopy;  Laterality: N/A;   ESOPHAGOGASTRODUODENOSCOPY     ESOPHAGOGASTRODUODENOSCOPY N/A 04/10/2018   Procedure: ESOPHAGOGASTRODUODENOSCOPY (EGD);  Surgeon: Malissa Hippo, MD;   [facility];  Service: Endoscopy;  Laterality: N/A;  1:25   ESOPHAGOGASTRODUODENOSCOPY N/A 10/19/2018   Procedure: ESOPHAGOGASTRODUODENOSCOPY (EGD);  Surgeon: Malissa Hippo, MD;  [facility];  Service: Endoscopy;  Laterality: N/A;  7:30   ESOPHAGOGASTRODUODENOSCOPY N/A 04/12/2020   Procedure: ESOPHAGOGASTRODUODENOSCOPY (EGD);  Surgeon: Malissa Hippo, MD;  [facility];  Service: Endoscopy;  Laterality: N/A;  235   EYE SURGERY Right    SALPINGOOPHORECTOMY Bilateral 04/03/2017   Procedure: SALPINGO OOPHORECTOMY;  Surgeon: Candice Camp, MD;  Location: [facility];  Service: Gynecology;  Laterality: Bilateral;   Social History   Socioeconomic History   Marital status: Married    Spouse name: Not on file   Number of children: 1   Years of education: Not on file   Highest education level: Master's degree (Cherry.g., MA, MS, MEng, MEd, MSW, MBA)  Occupational History   Not on file  Tobacco Use   Smoking status: Never   Smokeless tobacco: Never  Vaping Use   Vaping status: Never Used  Substance and Sexual Activity   Alcohol use: No    Alcohol/week: 0.0 standard drinks of alcohol   Drug use: No   Sexual activity: Not on file  Other Topics Concern   Not on file  Social History Narrative   Pt is married lives with spouse in 2 story home she has 1 children, Master Degree in Education   She is right handed, and drinks coffee once a  day, 1 soda a day and not tea   She does not work out at all   Social Determinants of Corporate investment banker Strain: Not on file  Food Insecurity: Not on file  Transportation Needs: Not on file  Physical Activity: Not on file  Stress: Not on file  Social Connections: Not on file   Outpatient Encounter Medications as of 07/07/2023  Medication Sig   VITRON-C 65-125 MG TABS Take 1 tablet by mouth daily.   botulinum toxin Type A (BOTOX) 200 units injection Inject 155 units IM into the multiple sites in the face neck and head  once every 90 days   citalopram (CELEXA) 20 MG tablet Take 10 mg by mouth at bedtime.   estradiol (ESTRACE) 0.5 MG tablet Take 0.5 mg by mouth daily.   Evolocumab (REPATHA) 140 MG/ML SOSY Inject 140 mg into the skin every 14 (fourteen) days. INJECT 14 MG UNDER THE SKIN EVERY 2 WEEKS   famotidine (PEPCID) 20 MG tablet Take 1 tablet (20 mg total) by mouth at bedtime.   folic acid (FOLVITE) 1 MG tablet Take 1 mg by mouth daily.    hydrocortisone (CORTEF) 5 MG tablet TAKE 2 TABLET BY MOUTH EVERY DAY AT 8AM AND THEN 1 TABLET EVERY DAY AT NOON   loratadine (CLARITIN) 10 MG tablet Take 10 mg by mouth at bedtime.    methotrexate (RHEUMATREX) 2.5 MG tablet Take 10 mg by mouth once a week. Caution:Chemotherapy. Protect from light.   montelukast (SINGULAIR) 10 MG tablet TAKE 1 TABLET(10 MG) BY MOUTH AT BEDTIME   Multiple Vitamins-Minerals (IMMUNE SUPPORT PO) Take 1 Scoop by mouth daily.   propranolol ER (INDERAL LA) 60 MG 24 hr capsule TAKE 1 CAPSULE(60 MG) BY MOUTH DAILY   RABEprazole (ACIPHEX) 20 MG tablet TAKE 1 TABLET BY MOUTH EVERY MORNING 30 MINUTES BEFORE BREAKFAST   RINVOQ 15 MG TB24 Take 1 tablet by mouth daily.   SUMAtriptan (IMITREX) 100 MG tablet TAKE 1 TABLET BY MOUTH AT ONSET OF HEADACHE. MAY REPEAT IN 2 HOURS IF HEADACHE PERSISTS   TIROSINT 50 MCG CAPS TAKE 1 CAPSULE(50 MCG) BY MOUTH DAILY BEFORE BREAKFAST (Patient taking differently: TAKE 1 CAPSULE(50 MCG) BY MOUTH EVERY OTHER DAY BEFORE BREAKFAST)   TIROSINT 75 MCG CAPS TAKE 1 CAPSULE(75 MCG) BY MOUTH EVERY OTHER DAY   [DISCONTINUED] Evolocumab (REPATHA) 140 MG/ML SOSY Inject 140 mg into the skin every 14 (fourteen) days. INJECT 14 MG UNDER THE SKIN EVERY 2 WEEKS   Facility-Administered Encounter Medications as of 07/07/2023  Medication   botulinum toxin Type A (BOTOX) injection 200 Units   ALLERGIES: Allergies  Allergen Reactions   Arava [Leflunomide] Hives   Azithromycin Other (See Comments)    Severe bloating, abd pain    Cefuroxime Axetil Other (See Comments)    Increased bloating, abd pain   Clarithromycin Other (See Comments)    abd pain   Etanercept Hives and Other (See Comments)    Injection site irritation (ENBREL)   Levofloxacin Hives and Other (See Comments)    Severe dizziness   Sulfa Antibiotics Hives and Other (See Comments)    abd pain   Methotrexate Derivatives Other (See Comments)    Injection ONLY (irritation at the site of injection)   VACCINATION STATUS:  There is no immunization history on file for this patient.  HPI  62 year old female patient with medical history as above.  She is being seen in follow-up for hypothyroidism, adrenal insufficiency, hyperlipidemia.   She remains on  her stable medication regimens with consistency and compliance.  She has no new complaint.  Patient has significant exposure to steroids over the years due to her autoimmune conditions.  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  She states that she was diagnosed with hypothyroidism approximately at age 27.  She has no new complaints.  Her previsit thyroid function tests are consistent with appropriate replacement with Tirosint 50 and 75 mcg on alternate days.    She still does not tolerate Crestor.  However she has tolerated Repatha which helped her lower her LDL to 72 although improving from 141.   - She has family  history of thyroid dysfunction in multiple members of her family.  She denies any history of goiter. She denies any family history of thyroid cancer. - She underwent total abdominal hysterectomy/ salphigo oophorectomy due to a large ovarian cyst. Findings were reportedly benign.  -She is on multiple medications for her rheumatoid arthritis.   Review of Systems Limited as above.  Objective:    BP 132/76   Pulse 72   Ht 5\' 2"  (1.575 m)   Wt 130 lb (59 kg)   LMP 03/31/2017   BMI 23.78 kg/m   Wt  Readings from Last 3 Encounters:  07/07/23 130 lb (59 kg)  02/25/23 128 lb 8 oz (58.3 kg)  12/31/22 127 lb 9.6 oz (57.9 kg)    Physical Exam    Recent Results (from the past 2160 hour(s))  TSH     Status: None   Collection Time: 07/01/23  8:14 AM  Result Value Ref Range   TSH 1.390 0.450 - 4.500 uIU/mL  T4, free     Status: None   Collection Time: 07/01/23  8:14 AM  Result Value Ref Range   Free T4 1.49 0.82 - 1.77 ng/dL  Lipid panel     Status: None   Collection Time: 07/01/23  8:14 AM  Result Value Ref Range   Cholesterol, Total 142 100 - 199 mg/dL   Triglycerides 295 0 - 149 mg/dL   HDL 49 >62 mg/dL   VLDL Cholesterol Cal 21 5 - 40 mg/dL   LDL Chol Calc (NIH) 72 0 - 99 mg/dL   Chol/HDL Ratio 2.9 0.0 - 4.4 ratio    Comment:                                   T. Chol/HDL Ratio                                             Men  Women                               1/2 Avg.Risk  3.4    3.3                                   Avg.Risk  5.0    4.4                                2X Avg.Risk  9.6  7.1                                3X Avg.Risk 23.4   11.0   Comprehensive metabolic panel     Status: None   Collection Time: 07/01/23  8:14 AM  Result Value Ref Range   Glucose 87 70 - 99 mg/dL   BUN 13 8 - 27 mg/dL   Creatinine, Ser 1.61 0.57 - 1.00 mg/dL   eGFR 86 >09 UE/AVW/0.98   BUN/Creatinine Ratio 16 12 - 28   Sodium 142 134 - 144 mmol/L   Potassium 4.3 3.5 - 5.2 mmol/L   Chloride 102 96 - 106 mmol/L   CO2 25 20 - 29 mmol/L   Calcium 9.3 8.7 - 10.3 mg/dL   Total Protein 6.4 6.0 - 8.5 g/dL   Albumin 4.3 3.8 - 4.9 g/dL   Globulin, Total 2.1 1.5 - 4.5 g/dL   Bilirubin Total 0.3 0.0 - 1.2 mg/dL   Alkaline Phosphatase 59 44 - 121 IU/L   AST 29 0 - 40 IU/L   ALT 21 0 - 32 IU/L  VITAMIN D 25 Hydroxy (Vit-D Deficiency, Fractures)     Status: None   Collection Time: 07/01/23  8:14 AM  Result Value Ref Range   Vit D, 25-Hydroxy 88.1 30.0 - 100.0 ng/mL    Comment: Vitamin D  deficiency has been defined by the Institute of Medicine and an Endocrine Society practice guideline as a level of serum 25-OH vitamin D less than 20 ng/mL (1,2). The Endocrine Society went on to further define vitamin D insufficiency as a level between 21 and 29 ng/mL (2). 1. IOM (Institute of Medicine). 2010. Dietary reference    intakes for calcium and D. [place]: The    [employer]. 2. Holick MF, Binkley Mora, Bischoff-Ferrari HA, et al.    Evaluation, treatment, and prevention of vitamin D    deficiency: an Endocrine Society clinical practice    guideline. JCEM. 2011 Jul; 96(7):1911-30.     - On 08/16/2016 her free T4 was 1.36, improving from prior studies. - On 05/09/2016: Free T4 0.63, TSH 7.58, TPO antibodies 13  Assessment & Plan:   Adrenal insufficiency  2.  Hypothyroidism  3.  Dyslipidemia I reviewed her recent labs assessing adrenal insufficiency.  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  Unlikely to be secondary to Addison's disease- now that she has negative antibodies for 21-hydroxylase .  -She is advised to continue hydrocortisone  10 AM, 5 mg p.o. daily at noon.  Sick day rules discussed with her.  She will likely need glucocorticoid replacement for life.  She will wear a medical alert depicting adrenal insufficiency.   Her blood pressure is stable, will not need mineralocorticoid replacement at this time.  If she started to have problems keeping her blood pressure, she will be considered for fludrocortisone.   -Her Tirosint was recently adjusted -  Advised her to take Tirosint 50 mcg on alternate days while taking 75 mcg on the other alternate days.    - We discussed about the correct intake of her thyroid hormone, on empty stomach at fasting, with water, separated by at least 30 minutes from breakfast and other medications,  and separated by more than 4 hours from calcium, iron, multivitamins,  acid reflux medications (PPIs). -Patient is made aware of the fact that thyroid hormone replacement  is needed for life, dose to be adjusted by periodic monitoring of thyroid function tests.   -She has no clinical goiter, or thyroid ultrasound from July 2019 was unremarkable except for shrunk bilateral thyroid lobes.    2.  Hyperlipidemia She has responded to her Repatha injections.  Her previsit labs show LDL at 72, overall improving from 141.    She will continue to benefit from this intervention.  She is advised to continue Repatha 140 mg subcutaneously every 2 weeks.  He would still benefit from lifestyle medicine.  - she acknowledges that there is a room for improvement in her food and drink choices. - Suggestion is made for her to avoid simple carbohydrates  from her diet including Cakes, Sweet Desserts, Ice Cream, Soda (diet and regular), Sweet Tea, Candies, Chips, Cookies, Store Bought Juices, Alcohol , Artificial Sweeteners,  Coffee Creamer, and "Sugar-free" Products, Lemonade. This will help patient to have more stable blood glucose profile and potentially avoid unintended weight gain.  The following Lifestyle Medicine recommendations according to American College of Lifestyle Medicine  [facility]) were discussed and and offered to patient and she  agrees to start the journey:  A. Whole Foods, Plant-Based Nutrition comprising of fruits and vegetables, plant-based proteins, whole-grain carbohydrates was discussed in detail with the patient.   A list for source of those nutrients were also provided to the patient.  Patient will use only water or unsweetened tea for hydration. B.  The need to stay away from risky substances including alcohol, smoking; obtaining 7 to 9 hours of restorative sleep, at least 150 minutes of moderate intensity exercise weekly, the importance of healthy social connections,  and stress management techniques were discussed. C.  A full color page of  Calorie density of  various food groups per pound showing examples of each food groups was provided to the patient.   Her labs did show extensive vitamin D, advised to discontinue all vitamin D supplements for now.  - I advised patient to maintain close follow up with Laurie Cherry for primary care needs.   I spent  26  minutes in the care of the patient today including review of labs from Thyroid Function, CMP, and other relevant labs ; imaging/biopsy records (current and previous including abstractions from other facilities); face-to-face time discussing  her lab results and symptoms, medications doses, her options of short and long term treatment based on the latest standards of care / guidelines;   and documenting the encounter.  Hughie Closs  participated in the discussions, expressed understanding, and voiced agreement with the above plans.  All questions were answered to her satisfaction. she is encouraged to contact clinic should she have any questions or concerns prior to her return visit.    Follow up plan: Return in about 6 months (around 01/04/2024) for Fasting Labs  in AM B4 8, A1c -NV.  Marquis Lunch, MD Phone: [phone]  Fax: [phone]   07/07/2023, 5:40 PM

## 2023-08-04 ENCOUNTER — Telehealth: Payer: Self-pay | Admitting: Pharmacy Technician

## 2023-08-04 ENCOUNTER — Other Ambulatory Visit (HOSPITAL_COMMUNITY): Payer: Self-pay

## 2023-08-04 NOTE — Telephone Encounter (Signed)
Pharmacy Patient Advocate Encounter   Received notification from Fax that prior authorization for Repatha is required/requested. Fax says they've tried to reach the office for additional info, unsuccessfully.    Insurance verification completed.   The patient is insured through [facility] .   Per test claim: PA required; PA started via CoverMyMeds. KEY  [id number] . Waiting for clinical questions to populate. It says unable to populate clinical ?s. Call pharmacy help desk [phone] (will call first thing in the morning)

## 2023-08-05 ENCOUNTER — Other Ambulatory Visit (HOSPITAL_COMMUNITY): Payer: Self-pay

## 2023-08-05 NOTE — Telephone Encounter (Signed)
Called [employer] [employer] and was able to get a new CoverMyMeds Key [id number]- Submitted PA. Will await reply.

## 2023-08-07 ENCOUNTER — Other Ambulatory Visit (HOSPITAL_COMMUNITY): Payer: Self-pay

## 2023-08-07 NOTE — Telephone Encounter (Signed)
Pharmacy Patient Advocate Encounter  Received notification from [facility] that Prior Authorization for Repatha has been APPROVED from 08/05/23 to 08/03/24 Last filled 07/07/23, next fill 09/08/23   PA #/Case ID/Reference #: PA Case ID #: [id number]

## 2023-08-25 ENCOUNTER — Telehealth: Payer: Self-pay

## 2023-08-25 ENCOUNTER — Other Ambulatory Visit (HOSPITAL_COMMUNITY): Payer: Self-pay

## 2023-08-25 NOTE — Telephone Encounter (Signed)
Pharmacy Patient Advocate Encounter   Received notification from CoverMyMeds that prior authorization for Tirosint is required/requested.   Insurance verification completed.   The patient is insured through [facility] .   Per test claim: PA required; PA started via CoverMyMeds. KEY [id number] . Waiting for clinical questions to populate.

## 2023-08-26 ENCOUNTER — Telehealth: Payer: Self-pay

## 2023-08-26 NOTE — Telephone Encounter (Signed)
PA needed for Botox 

## 2023-08-29 ENCOUNTER — Other Ambulatory Visit (HOSPITAL_COMMUNITY): Payer: Self-pay

## 2023-08-29 ENCOUNTER — Telehealth: Payer: Self-pay | Admitting: Pharmacy Technician

## 2023-08-29 NOTE — Telephone Encounter (Signed)
PA has been submitted, and telephone encounter has been created. 

## 2023-08-29 NOTE — Telephone Encounter (Signed)
BotoxOne verification has been submitted. Benefit Verification:  [facility]  Pharmacy PA has been submitted for BOTOX 200 via COVER MY MEDS. INSURANCE: [employer] DATE SUBMITTED: 11.1.24 KEY: [id number] Status is pending

## 2023-09-02 NOTE — Telephone Encounter (Signed)
Clinical info including chart notes and labs have been submitted

## 2023-09-03 ENCOUNTER — Other Ambulatory Visit (HOSPITAL_COMMUNITY): Payer: Self-pay

## 2023-09-03 NOTE — Telephone Encounter (Signed)
Left message with the after hour service on 09-03-23   Caller states that she needs to speak to someone about prior auth  she is call [facility]   Kennyth Arnold [phone]

## 2023-09-04 NOTE — Telephone Encounter (Signed)
Pharmacy Patient Advocate Encounter  Received notification from [facility] that Prior Authorization for Tirosint has been APPROVED through 08/31/2024   PA #/Case ID/Reference #: [id number]

## 2023-09-11 ENCOUNTER — Other Ambulatory Visit (HOSPITAL_COMMUNITY): Payer: Self-pay

## 2023-09-12 ENCOUNTER — Ambulatory Visit: Payer: BC Managed Care – PPO | Admitting: Neurology

## 2023-09-12 ENCOUNTER — Other Ambulatory Visit (HOSPITAL_COMMUNITY): Payer: Self-pay

## 2023-09-29 ENCOUNTER — Other Ambulatory Visit: Payer: Self-pay | Admitting: "Endocrinology

## 2023-10-02 ENCOUNTER — Telehealth: Payer: Self-pay | Admitting: Neurology

## 2023-10-02 NOTE — Telephone Encounter (Signed)
Patient said [facility] mailed her a letter stating her botox was approved and it would be shipped to our office by 10/08/23. She would like for you to call her to make sure this is accurate and to sch an appt

## 2023-10-07 ENCOUNTER — Telehealth: Payer: Self-pay | Admitting: Neurology

## 2023-10-07 NOTE — Telephone Encounter (Signed)
Pharmacy called regarding the pts botulinum toxin Type A (BOTOX) injection 200 Units and they need a pre authorization for this to be filled and shipped. It was faxed over 12/10*2024

## 2023-10-14 ENCOUNTER — Other Ambulatory Visit (HOSPITAL_COMMUNITY): Payer: Self-pay

## 2023-10-14 ENCOUNTER — Telehealth: Payer: Self-pay | Admitting: Pharmacy Technician

## 2023-10-14 NOTE — Telephone Encounter (Signed)
Pharmacy Patient Advocate Encounter- Botox BIV-Pharmacy Benefit:  PA was submitted to [facility] and has been approved through: 12.17.24 TO 12.16.25 Authorization# [id number]  Please send prescription to Specialty Pharmacy: [facility]: [phone]  Estimated Copay is: $0 (w/o copay card price is $234.58.)  Patient IS eligible for Botox Copay Card, which will make patient's copay as little as zero. Copay card will be provided to pharmacy.

## 2023-10-14 NOTE — Telephone Encounter (Signed)
Pharmacy Patient Advocate Encounter   Received notification from CoverMyMeds that prior authorization for BOTOX 200 is required/requested.   Insurance verification completed.   The patient is insured through [facility] .   Per test claim: PA required; PA submitted to above mentioned insurance via CoverMyMeds Key/confirmation #/EOC [id number] Status is pending

## 2023-10-15 ENCOUNTER — Other Ambulatory Visit: Payer: Self-pay

## 2023-10-15 ENCOUNTER — Other Ambulatory Visit (HOSPITAL_COMMUNITY): Payer: Self-pay | Admitting: Pharmacy Technician

## 2023-10-15 ENCOUNTER — Other Ambulatory Visit (HOSPITAL_COMMUNITY): Payer: Self-pay

## 2023-10-15 MED ORDER — BOTOX 200 UNITS IJ SOLR
INTRAMUSCULAR | 4 refills | Status: DC
  Filled 2023-10-15: qty 1, fill #0
  Filled 2023-10-15: qty 1, 90d supply, fill #0
  Filled 2023-12-26: qty 1, 90d supply, fill #1
  Filled 2024-04-05: qty 1, 90d supply, fill #2
  Filled 2024-07-12: qty 1, 90d supply, fill #3

## 2023-10-15 NOTE — Progress Notes (Signed)
Specialty Pharmacy Initial Fill Coordination Note  Laurie Cherry is a 62 y.o. female contacted today regarding initial fill of specialty medication(s) OnabotulinumtoxinA (Botox)   Patient requested Courier to Provider Office   Delivery date: 10/16/23   Verified address: [facility] [address], [place], [place], [address]   Medication will be filled on 10/15/2023.   Patient is aware of 0 copayment.

## 2023-10-16 ENCOUNTER — Other Ambulatory Visit: Payer: Self-pay

## 2023-10-17 ENCOUNTER — Ambulatory Visit: Payer: BC Managed Care – PPO | Admitting: Neurology

## 2023-10-17 DIAGNOSIS — G43709 Chronic migraine without aura, not intractable, without status migrainosus: Secondary | ICD-10-CM

## 2023-10-17 MED ORDER — ONABOTULINUMTOXINA 100 UNITS IJ SOLR
200.0000 [IU] | Freq: Once | INTRAMUSCULAR | Status: AC
Start: 2023-10-17 — End: 2023-10-17
  Administered 2023-10-17: 155 [IU] via INTRAMUSCULAR

## 2023-10-17 NOTE — Progress Notes (Signed)

## 2023-11-09 NOTE — Progress Notes (Signed)
 NEUROLOGY FOLLOW UP OFFICE NOTE  SEVEN [facility]ENGO [id number]  Assessment/Plan:   Migraine without aura, without status migrainosus, not intractable Elevated blood pressure reading    Migraine prevention:  Botox  every 3 months, propranolol  ER 60mg  daily.  Migraine rescue:  sumatriptan  50-100mg  Limit use of pain relievers to no more than 2 days out of week to prevent risk of rebound or medication-overuse headache. Keep headache diary Follow up with PCP regarding blood pressure Follow up in 6 months for routine visit.  Subjective:  Juanitta Earnhardt is a 63 year old right-handed female with rheumatoid arthritis, hypothyroidism, reflux and mild depression who follows up for migraines.   UPDATE: Intensity:  mild to moderate Duration:  1 to 2 hours. Frequency:  when taking Botox  on schedule, no migraines.  She was off schedule recently due to insurance issues, she reports dull headache/periorbital pain daily over the past week.  She thinks she is having a sinus infection.   Current NSAIDS:  ibuprofen  Current analgesics:  Excedrin Migraine Current triptans:  Sumatriptan  50mg  (100mg  caused drowsiness) w/wo naproxen  500mg  Current ergotamine:  none Current anti-emetic:  none Current muscle relaxants:  none Current anti-anxiolytic:  none Current sleep aide:  none Current Antihypertensive medications:  Propranolol  ER 60mg  Current Antidepressant medications:  Celexa  Current Anticonvulsant medications:  none Current anti-CGRP:  none Current Vitamins/Herbal/Supplements:  folic acid Current Antihistamines/Decongestants:  Claritin Other therapy:  Botox  Hormone/birth control:  estradiol Other medications:  Evolocumab , methotrexate, Rinvoq   Caffeine:  1 cup of coffee daily Alcohol:  no Smoker:  no Diet:  1/2 can of soda daily.  Drinks water  Exercise:  no Depression:  stable; Anxiety:  no Other pain:  no Sleep hygiene:  varies   HISTORY: She has a history of hormonal migraines  most of her life, described as pounding right temporal pain and often associated with her menstrual cycle.  They have pretty much stabilized but she will still get it every now and then.     She was diagnosed with rheumatoid arthritis in 2014.  She had been on methotrexate for about 2 years (stopped a month ago).  In September 2015, she began having new headaches.  It coincided with change in her thyroid  medications.  They are left sided, 7/10 non-throbbing pain.    They are associated with photophobia, phonophobia, and osmophobia but not nausea.  Initially, they typically lasted 3 to 4 days and occur 1 to 2 times a week (however she also has a dull daily headache) Stress is a trigger.  Change in thyroid  medication may have been a trigger. These headaches do not correlate with her menstrual cycle.  Sumatriptan  with naproxen  helps relieve it.   Past NSAIDS:  no Past analgesics:  Excedrin, Tylenol  (ineffective) Past abortive triptans:  no Past muscle relaxants:  no Past anti-emetic:  no Past antihypertensive medications:  atenolol  (25mg  ineffective and higher doses caused dizziness) Past antidepressant medications:  venlafaxine  XR 150mg  Past anticonvulsant medications:  topiramate  25mg  (caused drowsiness, but willing to retry it if needed) Past anti-CGRP:  Aimovig  70mg  (she was on it for 2 months.  Ineffective) Past vitamins/Herbal/Supplements:  no Past antihistamines/decongestants:  no Other past therapies:  no   Family history of headache:  Mom, sister Mother had glioblastoma  PAST MEDICAL HISTORY: Past Medical History:  Diagnosis Date   Depression    Esophagitis    Family history of adverse reaction to anesthesia    Sister   Fibroids    GERD (gastroesophageal reflux disease)  Hypothyroidism    Rheumatoid arthritis (HCC)    Rheumatoid arthritis (HCC) 01/20/2017   Worsening headaches     MEDICATIONS: Current Outpatient Medications on File Prior to Visit  Medication Sig Dispense  Refill   botulinum toxin Type A  (BOTOX ) 200 units injection Inject 155 units IM into the multiple sites in the face neck and head once every 90 days 1 each 4   citalopram  (CELEXA ) 20 MG tablet Take 10 mg by mouth at bedtime.  3   estradiol (ESTRACE) 0.5 MG tablet Take 0.5 mg by mouth daily.     Evolocumab  (REPATHA ) 140 MG/ML SOSY Inject 140 mg into the skin every 14 (fourteen) days. INJECT 14 MG UNDER THE SKIN EVERY 2 WEEKS 6 mL 1   famotidine  (PEPCID ) 20 MG tablet Take 1 tablet (20 mg total) by mouth at bedtime. 60 tablet 3   folic acid (FOLVITE) 1 MG tablet Take 1 mg by mouth daily.      hydrocortisone  (CORTEF ) 5 MG tablet TAKE 2 TABLET BY MOUTH EVERY DAY AT 8AM AND THEN 1 TABLET EVERY DAY AT NOON 270 tablet 0   loratadine (CLARITIN) 10 MG tablet Take 10 mg by mouth at bedtime.      methotrexate (RHEUMATREX) 2.5 MG tablet Take 10 mg by mouth once a week. Caution:Chemotherapy. Protect from light.     montelukast  (SINGULAIR ) 10 MG tablet TAKE 1 TABLET(10 MG) BY MOUTH AT BEDTIME 90 tablet 3   Multiple Vitamins-Minerals (IMMUNE SUPPORT PO) Take 1 Scoop by mouth daily.     propranolol  ER (INDERAL  LA) 60 MG 24 hr capsule TAKE 1 CAPSULE(60 MG) BY MOUTH DAILY 90 capsule 3   RABEprazole  (ACIPHEX ) 20 MG tablet TAKE 1 TABLET BY MOUTH EVERY MORNING 30 MINUTES BEFORE BREAKFAST 90 tablet 3   RINVOQ 15 MG TB24 Take 1 tablet by mouth daily.     SUMAtriptan  (IMITREX ) 100 MG tablet TAKE 1 TABLET BY MOUTH AT ONSET OF HEADACHE. MAY REPEAT IN 2 HOURS IF HEADACHE PERSISTS 10 tablet 3   TIROSINT  50 MCG CAPS TAKE 1 CAPSULE(50 MCG) BY MOUTH DAILY BEFORE BREAKFAST 30 capsule 2   TIROSINT  75 MCG CAPS TAKE 1 CAPSULE(75 MCG) BY MOUTH EVERY OTHER DAY 50 capsule 1   VITRON-C 65-125 MG TABS Take 1 tablet by mouth daily.     Current Facility-Administered Medications on File Prior to Visit  Medication Dose Route Frequency Provider Last Rate Last Admin   botulinum toxin Type A  (BOTOX ) injection 200 Units  200 Units  Intramuscular Q90 days Skeet Juliene SAUNDERS, DO   155 Units at 11/28/22 1153    ALLERGIES: Allergies  Allergen Reactions   Arava [Leflunomide] Hives   Azithromycin Other (See Comments)    Severe bloating, abd pain   Cefuroxime Axetil Other (See Comments)    Increased bloating, abd pain   Clarithromycin Other (See Comments)    abd pain   Etanercept Hives and Other (See Comments)    Injection site irritation (ENBREL)   Levofloxacin Hives and Other (See Comments)    Severe dizziness   Sulfa Antibiotics Hives and Other (See Comments)    abd pain   Methotrexate Derivatives Other (See Comments)    Injection ONLY (irritation at the site of injection)    FAMILY HISTORY: Family History  Problem Relation Age of Onset   Thyroid  disease Mother    Heart attack Mother    Stroke Mother    Diabetes Mother    Brain cancer Mother    Colon cancer  Father    Heart attack Sister    Hypertension Sister    Hypertension Brother       Objective:  Blood pressure (!) 151/89, pulse 86, height 5' 2 (1.575 m), weight 136 lb 12.8 oz (62.1 kg), last menstrual period 03/31/2017. General: No acute distress.  Patient appears well-groomed.   Head:  Normocephalic/atraumatic Eyes:  Fundi examined but not visualized Neck: supple, no paraspinal tenderness, full range of motion Heart:  Regular rate and rhythm Neurological Exam: alert and oriented.  Speech fluent and not dysarthric, language intact.  CN II-XII intact. Bulk and tone normal, muscle strength 5/5 throughout.  Sensation to light touch intact.  Deep tendon reflexes 2+ throughout, toes downgoing.  Finger to nose testing intact.  Gait normal, Romberg negative.   Juliene Dunnings, DO  CC: Lawrnce Holmes

## 2023-11-10 ENCOUNTER — Other Ambulatory Visit: Payer: Self-pay | Admitting: Neurology

## 2023-11-10 ENCOUNTER — Encounter: Payer: Self-pay | Admitting: Neurology

## 2023-11-10 ENCOUNTER — Ambulatory Visit: Payer: 59 | Admitting: Neurology

## 2023-11-10 VITALS — BP 151/89 | HR 86 | Ht 62.0 in | Wt 136.8 lb

## 2023-11-10 DIAGNOSIS — R030 Elevated blood-pressure reading, without diagnosis of hypertension: Secondary | ICD-10-CM

## 2023-11-10 DIAGNOSIS — G43009 Migraine without aura, not intractable, without status migrainosus: Secondary | ICD-10-CM | POA: Diagnosis not present

## 2023-11-10 MED ORDER — SUMATRIPTAN SUCCINATE 100 MG PO TABS: 100.0000 mg | ORAL_TABLET | ORAL | 5 refills | Status: AC | PRN

## 2023-11-10 MED ORDER — PROPRANOLOL HCL ER 60 MG PO CP24: ORAL_CAPSULE | ORAL | 3 refills | Status: DC

## 2023-11-10 NOTE — Patient Instructions (Signed)
 Botox Propranolol ER 60mg  daily Sumatriptan as needed

## 2023-11-14 ENCOUNTER — Ambulatory Visit: Payer: BC Managed Care – PPO | Admitting: Neurology

## 2023-12-12 ENCOUNTER — Ambulatory Visit: Payer: BC Managed Care – PPO | Admitting: Neurology

## 2023-12-22 ENCOUNTER — Encounter: Payer: Self-pay | Admitting: "Endocrinology

## 2023-12-26 ENCOUNTER — Other Ambulatory Visit (HOSPITAL_COMMUNITY): Payer: Self-pay

## 2023-12-26 ENCOUNTER — Other Ambulatory Visit: Payer: Self-pay

## 2023-12-26 NOTE — Progress Notes (Signed)
 Specialty Pharmacy Refill Coordination Note  Laurie Cherry is a 63 y.o. female contacted today regarding refills of specialty medication(s) OnabotulinumtoxinA (Botox)   Patient requested Courier to Provider Office   Delivery date: 01/08/24   Verified address: [address]   [place] [address]   Medication will be filled on 01/07/24.

## 2024-01-03 LAB — COMPREHENSIVE METABOLIC PANEL
ALT: 15 IU/L (ref 0–32)
AST: 20 IU/L (ref 0–40)
Albumin: 4.1 g/dL (ref 3.9–4.9)
Alkaline Phosphatase: 67 IU/L (ref 44–121)
BUN/Creatinine Ratio: 15 (ref 12–28)
BUN: 12 mg/dL (ref 8–27)
Bilirubin Total: 0.6 mg/dL (ref 0.0–1.2)
CO2: 27 mmol/L (ref 20–29)
Calcium: 9.3 mg/dL (ref 8.7–10.3)
Chloride: 101 mmol/L (ref 96–106)
Creatinine, Ser: 0.78 mg/dL (ref 0.57–1.00)
Globulin, Total: 2.1 g/dL (ref 1.5–4.5)
Glucose: 86 mg/dL (ref 70–99)
Potassium: 4.1 mmol/L (ref 3.5–5.2)
Sodium: 140 mmol/L (ref 134–144)
Total Protein: 6.2 g/dL (ref 6.0–8.5)
eGFR: 86 mL/min/{1.73_m2} (ref >59)

## 2024-01-03 LAB — LIPID PANEL
Chol/HDL Ratio: 3.4 ratio (ref 0.0–4.4)
Cholesterol, Total: 155 mg/dL (ref 100–199)
HDL: 46 mg/dL (ref >39)
LDL Chol Calc (NIH): 86 mg/dL (ref 0–99)
Triglycerides: 129 mg/dL (ref 0–149)
VLDL Cholesterol Cal: 23 mg/dL (ref 5–40)

## 2024-01-03 LAB — T4, FREE: Free T4: 1.7 ng/dL (ref 0.82–1.77)

## 2024-01-03 LAB — TSH: TSH: 1.27 u[IU]/mL (ref 0.450–4.500)

## 2024-01-03 LAB — VITAMIN D 25 HYDROXY (VIT D DEFICIENCY, FRACTURES): Vit D, 25-Hydroxy: 65 ng/mL (ref 30.0–100.0)

## 2024-01-05 ENCOUNTER — Encounter: Payer: Self-pay | Admitting: "Endocrinology

## 2024-01-05 ENCOUNTER — Ambulatory Visit: Payer: BC Managed Care – PPO | Admitting: "Endocrinology

## 2024-01-05 VITALS — BP 124/70 | HR 76 | Ht 62.0 in | Wt 130.2 lb

## 2024-01-05 DIAGNOSIS — E2740 Unspecified adrenocortical insufficiency: Secondary | ICD-10-CM

## 2024-01-05 DIAGNOSIS — E782 Mixed hyperlipidemia: Secondary | ICD-10-CM

## 2024-01-05 DIAGNOSIS — E039 Hypothyroidism, unspecified: Secondary | ICD-10-CM

## 2024-01-05 NOTE — Progress Notes (Signed)
 01/05/2024  Endocrinology follow-up note   Subjective:    Patient ID: Laurie Cherry, female    DOB: Nov 05, 1960, PCP Richarda Blade E   Past Medical History:  Diagnosis Date   Depression    Esophagitis    Family history of adverse reaction to anesthesia    Sister   Fibroids    GERD (gastroesophageal reflux disease)    Hypothyroidism    Rheumatoid arthritis (HCC)    Rheumatoid arthritis (HCC) 01/20/2017   Worsening headaches    Past Surgical History:  Procedure Laterality Date   ABDOMINAL HYSTERECTOMY N/A 04/03/2017   Procedure: TOTAL HYSTERECTOMY ABDOMINAL;  Surgeon: Candice Camp, MD;  Location: [facility];  Service: Gynecology;  Laterality: N/A;   BIOPSY  04/10/2018   Procedure: BIOPSY;  Surgeon: Malissa Hippo, MD;  Location: [facility];  Service: Endoscopy;;  esophegus    BIOPSY  10/19/2018   Procedure: BIOPSY;  Surgeon: Malissa Hippo, MD;  Location: [facility];  Service: Endoscopy;;  esophageal    BIOPSY  04/12/2020   Procedure: BIOPSY;  Surgeon: Malissa Hippo, MD;  Location: [facility];  Service: Endoscopy;;   COLONOSCOPY     COLONOSCOPY     COLONOSCOPY N/A 04/10/2018   Procedure: COLONOSCOPY;  Surgeon: Malissa Hippo, MD;  [facility]: [facility];  Service: Endoscopy;  Laterality: N/A;   ESOPHAGEAL DILATION N/A 04/10/2018   Procedure: ESOPHAGEAL DILATION;  Surgeon: Malissa Hippo, MD;  [facility]: [facility];  Service: Endoscopy;  Laterality: N/A;  balloon dilation   ESOPHAGEAL DILATION N/A 10/19/2018   Procedure: ESOPHAGEAL DILATION;  Surgeon: Malissa Hippo, MD;  [facility]: [facility];  Service: Endoscopy;  Laterality: N/A;   ESOPHAGEAL DILATION N/A 04/12/2020   Procedure: ESOPHAGEAL DILATION;  Surgeon: Malissa Hippo, MD;  [facility]: [facility];  Service: Endoscopy;  Laterality: N/A;   ESOPHAGOGASTRODUODENOSCOPY     ESOPHAGOGASTRODUODENOSCOPY N/A 04/10/2018   Procedure: ESOPHAGOGASTRODUODENOSCOPY (EGD);  Surgeon: Malissa Hippo, MD;   [facility];  Service: Endoscopy;  Laterality: N/A;  1:25   ESOPHAGOGASTRODUODENOSCOPY N/A 10/19/2018   Procedure: ESOPHAGOGASTRODUODENOSCOPY (EGD);  Surgeon: Malissa Hippo, MD;  [facility];  Service: Endoscopy;  Laterality: N/A;  7:30   ESOPHAGOGASTRODUODENOSCOPY N/A 04/12/2020   Procedure: ESOPHAGOGASTRODUODENOSCOPY (EGD);  Surgeon: Malissa Hippo, MD;  [facility];  Service: Endoscopy;  Laterality: N/A;  235   EYE SURGERY Right    SALPINGOOPHORECTOMY Bilateral 04/03/2017   Procedure: SALPINGO OOPHORECTOMY;  Surgeon: Candice Camp, MD;  Location: [facility];  Service: Gynecology;  Laterality: Bilateral;   Social History   Socioeconomic History   Marital status: Married    Spouse name: Not on file   Number of children: 1   Years of education: Not on file   Highest education level: Master's degree (e.g., MA, MS, MEng, MEd, MSW, MBA)  Occupational History   Not on file  Tobacco Use   Smoking status: Never   Smokeless tobacco: Never  Vaping Use   Vaping status: Never Used  Substance and Sexual Activity   Alcohol use: No    Alcohol/week: 0.0 standard drinks of alcohol   Drug use: No   Sexual activity: Not on file  Other Topics Concern   Not on file  Social History Narrative   Pt is married lives with spouse in 2 story home she has 1 children, Master Degree in Education   She is right handed, and drinks coffee once a  day, 1 soda a day and not tea   She does not work out at all   Social Drivers of [employer] Strain: Not on [employer] Insecurity: Not on file  Transportation Needs: Not on file  Physical Activity: Not on file  Stress: Not on file  Social Connections: Not on file   Outpatient Encounter Medications as of 01/05/2024  Medication Sig   cyanocobalamin (VITAMIN B12) 1000 MCG/ML injection Inject 1,000 mcg into the muscle once a week.   botulinum toxin Type A (BOTOX) 200 units injection Inject 155 units IM into the  multiple sites in the face neck and head once every 90 days   citalopram (CELEXA) 20 MG tablet Take 10 mg by mouth at bedtime.   Evolocumab (REPATHA) 140 MG/ML SOSY Inject 140 mg into the skin every 14 (fourteen) days. INJECT 14 MG UNDER THE SKIN EVERY 2 WEEKS   folic acid (FOLVITE) 1 MG tablet Take 1 mg by mouth daily.    hydrocortisone (CORTEF) 5 MG tablet TAKE 2 TABLET BY MOUTH EVERY DAY AT 8AM AND THEN 1 TABLET EVERY DAY AT NOON   loratadine (CLARITIN) 10 MG tablet Take 10 mg by mouth at bedtime.  (Patient not taking: Reported on 01/05/2024)   methotrexate (RHEUMATREX) 2.5 MG tablet Take 10 mg by mouth once a week. Caution:Chemotherapy. Protect from light.   montelukast (SINGULAIR) 10 MG tablet TAKE 1 TABLET(10 MG) BY MOUTH AT BEDTIME   propranolol ER (INDERAL LA) 60 MG 24 hr capsule TAKE 1 CAPSULE(60 MG) BY MOUTH DAILY   RABEprazole (ACIPHEX) 20 MG tablet TAKE 1 TABLET BY MOUTH EVERY MORNING 30 MINUTES BEFORE BREAKFAST   RINVOQ 15 MG TB24 Take 1 tablet by mouth daily. (Patient not taking: Reported on 01/05/2024)   SUMAtriptan (IMITREX) 100 MG tablet Take 1 tablet (100 mg total) by mouth as needed for migraine. May repeat in 2 hours if headache persists or recurs.   TIROSINT 50 MCG CAPS TAKE 1 CAPSULE(50 MCG) BY MOUTH DAILY BEFORE BREAKFAST (Patient taking differently: 50 mcg. TAKE 1 CAPSULE(50 MCG) BY MOUTH EVERY OTHER DAY BEFORE BREAKFAST)   TIROSINT 75 MCG CAPS TAKE 1 CAPSULE(75 MCG) BY MOUTH EVERY OTHER DAY   [DISCONTINUED] VITRON-C 65-125 MG TABS Take 1 tablet by mouth daily.   Facility-Administered Encounter Medications as of 01/05/2024  Medication   botulinum toxin Type A (BOTOX) injection 200 Units   ALLERGIES: Allergies  Allergen Reactions   Arava [Leflunomide] Hives   Azithromycin Other (See Comments)    Severe bloating, abd pain   Cefuroxime Axetil Other (See Comments)    Increased bloating, abd pain   Clarithromycin Other (See Comments)    abd pain   Etanercept Hives and  Other (See Comments)    Injection site irritation (ENBREL)   Levofloxacin Hives and Other (See Comments)    Severe dizziness   Sulfa Antibiotics Hives and Other (See Comments)    abd pain   Methotrexate Derivatives Other (See Comments)    Injection ONLY (irritation at the site of injection)   VACCINATION STATUS:  There is no immunization history on file for this patient.  HPI  63 year old female patient with medical history as above.  She is being seen in follow-up for hypothyroidism, adrenal insufficiency, hyperlipidemia.   She remains on her stable medication regimens with consistency and compliance.  She has no new complaint.  Patient has significant exposure to steroids over the years due to her autoimmune conditions.  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  She states that she was diagnosed with hypothyroidism approximately at age 72.  She has no new complaints.  Her previsit thyroid function tests are consistent with appropriate replacement with  Tirosint 50 and 75 mcg on alternate days.    She is responding to Repatha for hyperlipidemia with significant improvement in her lipid panel-see below.    She has family  history of thyroid dysfunction in multiple members of her family.  She denies any history of goiter. She denies any family history of thyroid cancer. - She underwent total abdominal hysterectomy/ salphigo oophorectomy due to a large ovarian cyst. Findings were reportedly benign.  -She is on multiple medications for her rheumatoid arthritis.   Review of Systems Limited as above.  Objective:    BP 124/70   Pulse 76   Ht 5\' 2"  (1.575 m)   Wt 130 lb 3.2 oz (59.1 kg)   LMP 03/31/2017   BMI 23.81 kg/m   Wt Readings from Last 3 Encounters:  01/05/24 130 lb 3.2 oz (59.1 kg)  11/10/23 136 lb 12.8 oz (62.1 kg)  07/07/23 130 lb (59 kg)    Physical Exam    Recent Results (from  the past 2160 hours)  Lipid panel     Status: None   Collection Time: 01/02/24  8:18 AM  Result Value Ref Range   Cholesterol, Total 155 100 - 199 mg/dL   Triglycerides 161 0 - 149 mg/dL   HDL 46 >09 mg/dL   VLDL Cholesterol Cal 23 5 - 40 mg/dL   LDL Chol Calc (NIH) 86 0 - 99 mg/dL   Chol/HDL Ratio 3.4 0.0 - 4.4 ratio    Comment:                                   T. Chol/HDL Ratio                                             Men  Women                               1/2 Avg.Risk  3.4    3.3                                   Avg.Risk  5.0    4.4                                2X Avg.Risk  9.6    7.1                                3X Avg.Risk 23.4   11.0   VITAMIN D 25 Hydroxy (Vit-D Deficiency, Fractures)     Status: None   Collection Time: 01/02/24  8:18 AM  Result Value Ref Range   Vit D, 25-Hydroxy 65.0 30.0 - 100.0 ng/mL    Comment: Vitamin D deficiency has been defined by the Institute of Medicine and an Endocrine Society practice guideline as  a level of serum 25-OH vitamin D less than 20 ng/mL (1,2). The Endocrine Society went on to further define vitamin D insufficiency as a level between 21 and 29 ng/mL (2). 1. IOM (Institute of Medicine). 2010. Dietary reference    intakes for calcium and D. [place]: The    [employer]. 2. Holick MF, Binkley Abiquiu, Bischoff-Ferrari HA, et al.    Evaluation, treatment, and prevention of vitamin D    deficiency: an Endocrine Society clinical practice    guideline. JCEM. 2011 Jul; 96(7):1911-30.   Comprehensive metabolic panel     Status: None   Collection Time: 01/02/24  8:18 AM  Result Value Ref Range   Glucose 86 70 - 99 mg/dL   BUN 12 8 - 27 mg/dL   Creatinine, Ser 6.96 0.57 - 1.00 mg/dL   eGFR 86 >29 BM/WUX/3.24   BUN/Creatinine Ratio 15 12 - 28   Sodium 140 134 - 144 mmol/L   Potassium 4.1 3.5 - 5.2 mmol/L   Chloride 101 96 - 106 mmol/L   CO2 27 20 - 29 mmol/L   Calcium 9.3 8.7 - 10.3 mg/dL   Total Protein 6.2  6.0 - 8.5 g/dL   Albumin 4.1 3.9 - 4.9 g/dL   Globulin, Total 2.1 1.5 - 4.5 g/dL   Bilirubin Total 0.6 0.0 - 1.2 mg/dL   Alkaline Phosphatase 67 44 - 121 IU/L   AST 20 0 - 40 IU/L   ALT 15 0 - 32 IU/L  TSH     Status: None   Collection Time: 01/02/24  8:18 AM  Result Value Ref Range   TSH 1.270 0.450 - 4.500 uIU/mL  T4, free     Status: None   Collection Time: 01/02/24  8:18 AM  Result Value Ref Range   Free T4 1.70 0.82 - 1.77 ng/dL    - On 40/07/2724 her free T4 was 1.36, improving from prior studies. - On 05/09/2016: Free T4 0.63, TSH 7.58, TPO antibodies 13  Assessment & Plan:   Adrenal insufficiency  2.  Hypothyroidism  3.  Dyslipidemia  4.  Vitamin D deficiency I reviewed her recent labs assessing adrenal insufficiency.  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  Unlikely to be secondary to Addison's disease- now that she has negative antibodies for 21-hydroxylase .  -She is advised to continue hydrocortisone 10 mg every a.m., 5 mg daily at noon.   Sick day rules discussed with her.  She will likely need glucocorticoid replacement for life.  She wears a medical alert depicting adrenal insufficiency.   Her blood pressure is stable, will not need mineralocorticoid replacement at this time.  If she started to have problems keeping her blood pressure, she will be considered for fludrocortisone.   -Her Tirosint was recently adjusted -  Advised her to take Tirosint 50 mcg on alternate days while taking 75 mcg on the other alternate days.     - We discussed about the correct intake of her thyroid hormone, on empty stomach at fasting, with water, separated by at least 30 minutes from breakfast and other medications,  and separated by more than 4 hours from calcium, iron, multivitamins, acid reflux medications (PPIs). -Patient is made aware of the fact that thyroid hormone replacement is needed for life, dose to be adjusted by  periodic monitoring of thyroid function tests.   -She has no clinical goiter, or thyroid ultrasound from July 2019 was unremarkable except  for shrunk bilateral thyroid lobes.     For  Hyperlipidemia She has responded to her Repatha injections.  Her previsit labs show LDL at 86, overall improving from 177.    She will continue to benefit from this intervention.  She is advised to continue Repatha 140 mg subcutaneously every 2 weeks. Her point-of-care A1c is 5.5%, indicating absence of prediabetes/diabetes.    She is now vitamin D replete, advised to continue maintenance vitamin D with 2000 units of vitamin D3 daily.    - I advised patient to maintain close follow up with Richarda Blade E for primary care needs.  I spent  26  minutes in the care of the patient today including review of labs from Thyroid Function, CMP, and other relevant labs ; imaging/biopsy records (current and previous including abstractions from other facilities); face-to-face time discussing  her lab results and symptoms, medications doses, her options of short and long term treatment based on the latest standards of care / guidelines;   and documenting the encounter.  Hughie Closs  participated in the discussions, expressed understanding, and voiced agreement with the above plans.  All questions were answered to her satisfaction. she is encouraged to contact clinic should she have any questions or concerns prior to her return visit.     Follow up plan: Return in about 6 months (around 07/07/2024) for Fasting Labs  in AM B4 8.  Marquis Lunch, MD Phone: [phone]  Fax: [phone]   01/05/2024, 4:30 PM

## 2024-01-08 ENCOUNTER — Other Ambulatory Visit: Payer: Self-pay | Admitting: "Endocrinology

## 2024-01-15 ENCOUNTER — Encounter: Payer: Self-pay | Admitting: Neurology

## 2024-01-16 ENCOUNTER — Ambulatory Visit: Payer: BC Managed Care – PPO | Admitting: Neurology

## 2024-01-16 DIAGNOSIS — G43709 Chronic migraine without aura, not intractable, without status migrainosus: Secondary | ICD-10-CM | POA: Diagnosis not present

## 2024-01-16 MED ORDER — ONABOTULINUMTOXINA 200 UNITS IJ SOLR
200.0000 [IU] | Freq: Once | INTRAMUSCULAR | Status: AC
  Administered 2024-01-16: 155 [IU] via INTRAMUSCULAR

## 2024-01-16 NOTE — Progress Notes (Signed)

## 2024-01-26 ENCOUNTER — Encounter: Payer: Self-pay | Admitting: "Endocrinology

## 2024-01-28 ENCOUNTER — Other Ambulatory Visit: Payer: Self-pay | Admitting: "Endocrinology

## 2024-01-28 MED ORDER — FLUDROCORTISONE ACETATE 0.1 MG PO TABS: 0.1000 mg | ORAL_TABLET | Freq: Every day | ORAL | 3 refills | Status: DC

## 2024-02-10 NOTE — Telephone Encounter (Signed)
 Pharmacy Patient Advocate Encounter  Received notification from [facility] that Prior Authorization for BOTOX 200 has been APPROVED from 12.17.24 to 12.17.25   PA #/Case ID/Reference #: [id number]

## 2024-02-11 ENCOUNTER — Encounter: Payer: Self-pay | Admitting: "Endocrinology

## 2024-02-16 NOTE — Telephone Encounter (Signed)
 I think we will just have to let her know Dr. Monte Antonio is out of the office and he can respond when he gets back, I do not know if the aldosterone is relevant as this is not something I deal with routinely.

## 2024-02-26 ENCOUNTER — Ambulatory Visit (INDEPENDENT_AMBULATORY_CARE_PROVIDER_SITE_OTHER): Payer: BC Managed Care – PPO | Admitting: Gastroenterology

## 2024-02-26 ENCOUNTER — Encounter (INDEPENDENT_AMBULATORY_CARE_PROVIDER_SITE_OTHER): Payer: Self-pay | Admitting: Gastroenterology

## 2024-02-26 VITALS — BP 155/80 | HR 67 | Temp 97.5°F | Ht 62.0 in | Wt 133.9 lb

## 2024-02-26 DIAGNOSIS — K219 Gastro-esophageal reflux disease without esophagitis: Secondary | ICD-10-CM

## 2024-02-26 DIAGNOSIS — K200 Eosinophilic esophagitis: Secondary | ICD-10-CM

## 2024-02-26 DIAGNOSIS — K2100 Gastro-esophageal reflux disease with esophagitis, without bleeding: Secondary | ICD-10-CM

## 2024-02-26 NOTE — Progress Notes (Signed)
 Laurie Cherry, M.D. Gastroenterology & Hepatology [facility] [address] [place], [place] [address]  Primary Care Physician: Laurie Cherry [address] [place] [address]  I will communicate my assessment and recommendations to the referring MD via EMR.  Problems: GERD Eosinophilic esophagitis  History of Present Illness: Laurie Cherry is a 63 y.o. female with past medical history of GERD, RA, hypothyroidism, depression and EOE, who presents for follow up of GERD and eosinophilic esophagitis.  The patient was last seen on 02/25/2023. At that time, the patient was advised to do a trial of PPI for 2 to 3 weeks and to take famotidine  20 mg daily.  However, it appears that she requested a refill for her Aciphex  as she had a refill of rabeprazole  20 mg daily.  Patient reports that she is taking Aciphex  20 mg daily. She states that as long as she takes it, she does not have issues with heartburn. If she misses a dose, she would feel upper abdominal pain, so it eventually resolves when she takes her PPI. No heartburn or dysphagia.  The patient denies having any nausea, vomiting, fever, chills, hematochezia, melena, hematemesis, abdominal distention, abdominal pain, diarrhea, jaundice, pruritus or weight loss.  Last EGD:04/12/20 - Normal hypopharynx. - Normal proximal esophagus. - Esophageal mucosal changes secondary to eosinophilic esophagitis. Biopsied. - Benign- appearing esophageal stenosis. Dilated. - 2 cm hiatal hernia. - Normal stomach. - Normal duodenal bulb and second portion of the duodenum.  Esophageal biopsy showing at least 21 eosinophils per high-power field.  Last Colonoscopy:2019 Normal colonoscopy. Recommend to repeat in 10 years.  Past Medical History: Past Medical History:  Diagnosis Date   Depression    Esophagitis    Family history of adverse reaction to anesthesia    Sister   Fibroids    GERD  (gastroesophageal reflux disease)    Hypothyroidism    Rheumatoid arthritis (HCC)    Rheumatoid arthritis (HCC) 01/20/2017   Worsening headaches     Past Surgical History: Past Surgical History:  Procedure Laterality Date   ABDOMINAL HYSTERECTOMY N/A 04/03/2017   Procedure: TOTAL HYSTERECTOMY ABDOMINAL;  Surgeon: Belle Box, MD;  Location: [facility];  Service: Gynecology;  Laterality: N/A;   BIOPSY  04/10/2018   Procedure: BIOPSY;  Surgeon: Ruby Corporal, MD;  Location: [facility];  Service: Endoscopy;;  esophegus    BIOPSY  10/19/2018   Procedure: BIOPSY;  Surgeon: Ruby Corporal, MD;  [facility]: [facility];  Service: Endoscopy;;  esophageal    BIOPSY  04/12/2020   Procedure: BIOPSY;  Surgeon: Ruby Corporal, MD;  [facility];  Service: Endoscopy;;   COLONOSCOPY     COLONOSCOPY     COLONOSCOPY N/A 04/10/2018   Procedure: COLONOSCOPY;  Surgeon: Ruby Corporal, MD;  [facility];  Service: Endoscopy;  Laterality: N/A;   ESOPHAGEAL DILATION N/A 04/10/2018   Procedure: ESOPHAGEAL DILATION;  Surgeon: Ruby Corporal, MD;  [facility];  Service: Endoscopy;  Laterality: N/A;  balloon dilation   ESOPHAGEAL DILATION N/A 10/19/2018   Procedure: ESOPHAGEAL DILATION;  Surgeon: Ruby Corporal, MD;  [facility]: [facility];  Service: Endoscopy;  Laterality: N/A;   ESOPHAGEAL DILATION N/A 04/12/2020   Procedure: ESOPHAGEAL DILATION;  Surgeon: Ruby Corporal, MD;  [facility];  Service: Endoscopy;  Laterality: N/A;   ESOPHAGOGASTRODUODENOSCOPY     ESOPHAGOGASTRODUODENOSCOPY N/A 04/10/2018   Procedure: ESOPHAGOGASTRODUODENOSCOPY (EGD);  Surgeon: Angelica Kemp  U, MD;  [facility];  Service: Endoscopy;  Laterality: N/A;  1:25   ESOPHAGOGASTRODUODENOSCOPY N/A 10/19/2018   Procedure: ESOPHAGOGASTRODUODENOSCOPY (EGD);  Surgeon: Ruby Corporal, MD;  [facility];  Service: Endoscopy;  Laterality: N/A;  7:30    ESOPHAGOGASTRODUODENOSCOPY N/A 04/12/2020   Procedure: ESOPHAGOGASTRODUODENOSCOPY (EGD);  Surgeon: Ruby Corporal, MD;  [facility];  Service: Endoscopy;  Laterality: N/A;  235   EYE SURGERY Right    SALPINGOOPHORECTOMY Bilateral 04/03/2017   Procedure: SALPINGO OOPHORECTOMY;  Surgeon: Belle Box, MD;  Location: [facility];  Service: Gynecology;  Laterality: Bilateral;    Family History: Family History  Problem Relation Age of Onset   Thyroid  disease Mother    Heart attack Mother    Stroke Mother    Diabetes Mother    Brain cancer Mother    Colon cancer Father    Heart attack Sister    Hypertension Sister    Hypertension Brother     Social History: Social History   Tobacco Use  Smoking Status Never  Smokeless Tobacco Never   Social History   Substance and Sexual Activity  Alcohol Use No   Alcohol/week: 0.0 standard drinks of alcohol   Social History   Substance and Sexual Activity  Drug Use No    Allergies: Allergies  Allergen Reactions   Arava [Leflunomide] Hives   Azithromycin Other (See Comments)    Severe bloating, abd pain   Cefuroxime Axetil Other (See Comments)    Increased bloating, abd pain   Clarithromycin Other (See Comments)    abd pain   Etanercept Hives and Other (See Comments)    Injection site irritation (ENBREL)   Levofloxacin Hives and Other (See Comments)    Severe dizziness   Sulfa Antibiotics Hives and Other (See Comments)    abd pain   Methotrexate Derivatives Other (See Comments)    Injection ONLY (irritation at the site of injection)    Medications: Current Outpatient Medications  Medication Sig Dispense Refill   botulinum toxin Type A  (BOTOX ) 200 units injection Inject 155 units IM into the multiple sites in the face neck and head once every 90 days 1 each 4   citalopram  (CELEXA ) 20 MG tablet Take 10 mg by mouth at bedtime.  3   cyanocobalamin (VITAMIN B12) 1000 MCG/ML injection Inject 1,000 mcg into the muscle once a  week.     Evolocumab  (REPATHA ) 140 MG/ML SOSY Inject 140 mg into the skin every 14 (fourteen) days. INJECT 14 MG UNDER THE SKIN EVERY 2 WEEKS 6 mL 1   fludrocortisone  (FLORINEF ) 0.1 MG tablet Take 1 tablet (0.1 mg total) by mouth daily with breakfast. 90 tablet 3   folic acid (FOLVITE) 1 MG tablet Take 1 mg by mouth daily.      hydrocortisone  (CORTEF ) 5 MG tablet TAKE 2 TABLET BY MOUTH EVERY DAY AT 8AM AND THEN 1 TABLET EVERY DAY AT NOON 270 tablet 0   loratadine (CLARITIN) 10 MG tablet Take 10 mg by mouth at bedtime.     methotrexate (RHEUMATREX) 2.5 MG tablet Take 10 mg by mouth once a week. Caution:Chemotherapy. Protect from light.     montelukast  (SINGULAIR ) 10 MG tablet TAKE 1 TABLET(10 MG) BY MOUTH AT BEDTIME 90 tablet 3   propranolol  ER (INDERAL  LA) 60 MG 24 hr capsule TAKE 1 CAPSULE(60 MG) BY MOUTH DAILY 90 capsule 3   RABEprazole  (ACIPHEX ) 20 MG tablet TAKE 1 TABLET BY MOUTH EVERY MORNING 30 MINUTES BEFORE  BREAKFAST 90 tablet 3   SUMAtriptan  (IMITREX ) 100 MG tablet Take 1 tablet (100 mg total) by mouth as needed for migraine. May repeat in 2 hours if headache persists or recurs. 10 tablet 5   TIROSINT  50 MCG CAPS TAKE 1 CAPSULE(50 MCG) BY MOUTH DAILY BEFORE BREAKFAST (Patient taking differently: 50 mcg. TAKE 1 CAPSULE(50 MCG) BY MOUTH EVERY OTHER DAY BEFORE BREAKFAST) 30 capsule 2   TIROSINT  75 MCG CAPS TAKE 1 CAPSULE(75 MCG) BY MOUTH EVERY OTHER DAY 50 capsule 1   RINVOQ 15 MG TB24 Take 1 tablet by mouth daily. (Patient not taking: Reported on 02/26/2024)     Current Facility-Administered Medications  Medication Dose Route Frequency Provider Last Rate Last Admin   botulinum toxin Type A  (BOTOX ) injection 200 Units  200 Units Intramuscular Q90 days Merriam Abbey, DO   155 Units at 11/28/22 1153    Review of Systems: GENERAL: negative for malaise, night sweats HEENT: No changes in hearing or vision, no nose bleeds or other nasal problems. NECK: Negative for lumps, goiter, pain and  significant neck swelling RESPIRATORY: Negative for cough, wheezing CARDIOVASCULAR: Negative for chest pain, leg swelling, palpitations, orthopnea GI: SEE HPI MUSCULOSKELETAL: Negative for joint pain or swelling, back pain, and muscle pain. SKIN: Negative for lesions, rash PSYCH: Negative for sleep disturbance, mood disorder and recent psychosocial stressors. HEMATOLOGY Negative for prolonged bleeding, bruising easily, and swollen nodes. ENDOCRINE: Negative for cold or heat intolerance, polyuria, polydipsia and goiter. NEURO: negative for tremor, gait imbalance, syncope and seizures. The remainder of the review of systems is noncontributory.   Physical Exam: BP (!) 155/80 (BP Location: Left Arm, Patient Position: Sitting, Cuff Size: Normal)   Pulse 67   Temp (!) 97.5 F (36.4 C) (Temporal)   Ht 5\' 2"  (1.575 m)   Wt 133 lb 14.4 oz (60.7 kg)   LMP 03/31/2017   BMI 24.49 kg/m  GENERAL: The patient is AO x3, in no acute distress. HEENT: Head is normocephalic and atraumatic. EOMI are intact. Mouth is well hydrated and without lesions. NECK: Supple. No masses LUNGS: Clear to auscultation. No presence of rhonchi/wheezing/rales. Adequate chest expansion HEART: RRR, normal s1 and s2. ABDOMEN: Soft, nontender, no guarding, no peritoneal signs, and nondistended. BS +. No masses. EXTREMITIES: Without any cyanosis, clubbing, rash, lesions or edema. NEUROLOGIC: AOx3, no focal motor deficit. SKIN: no jaundice, no rashes  Imaging/Labs: as above  I personally reviewed and interpreted the available labs, imaging and endoscopic files.  Impression and Plan: Laurie Cherry is a 63 y.o. female with past medical history of GERD, RA, hypothyroidism, depression and EOE, who presents for follow up of GERD and eosinophilic esophagitis.  The patient has presented longstanding history of dysphagia which has been attributed to combination of GERD and eosinophilic esophagitis.  She has needed to undergo  dilation of her esophagus to improve strictures caused by eosinophilic esophagitis.  Fortunately, she is currently asymptomatic and denies any complaints while on Aciphex .  Most recent endoscopy showed presence of histological activity of eosinophilic esophagitis but no significant endoscopic findings.  I had a thorough discussion with the patient regarding the importance of continuing PPI on the long run as this will increase the chances of disease progression, which she understood.  We also discussed that if she were to have worsening recurrent episodes dysphagia, she will need to reach us  to schedule a new EGD.  -Continue Aciphex  20 mg daily  All questions were answered.      Laurie Cress,  MD Gastroenterology and Hepatology [facility]

## 2024-02-26 NOTE — Patient Instructions (Signed)
Continue Aciphex 20 mg daily

## 2024-03-08 ENCOUNTER — Other Ambulatory Visit: Payer: Self-pay | Admitting: "Endocrinology

## 2024-03-22 ENCOUNTER — Other Ambulatory Visit: Payer: Self-pay | Admitting: "Endocrinology

## 2024-03-25 ENCOUNTER — Encounter: Payer: Self-pay | Admitting: "Endocrinology

## 2024-04-01 ENCOUNTER — Other Ambulatory Visit (INDEPENDENT_AMBULATORY_CARE_PROVIDER_SITE_OTHER): Payer: Self-pay | Admitting: Gastroenterology

## 2024-04-01 DIAGNOSIS — K2100 Gastro-esophageal reflux disease with esophagitis, without bleeding: Secondary | ICD-10-CM

## 2024-04-05 ENCOUNTER — Other Ambulatory Visit: Payer: Self-pay

## 2024-04-05 NOTE — Progress Notes (Signed)
 Specialty Pharmacy Refill Coordination Note  Laurie Cherry is a 63 y.o. female assessed today regarding refills of clinic administered specialty medication(s) OnabotulinumtoxinA  (Botox )   Clinic requested Courier to Provider Office   Delivery date: 04/19/24   Verified address: [address]   [place] [address]   Medication will be filled on 06.20.25.

## 2024-04-06 ENCOUNTER — Other Ambulatory Visit: Payer: Self-pay

## 2024-04-16 ENCOUNTER — Other Ambulatory Visit: Payer: Self-pay

## 2024-04-19 ENCOUNTER — Other Ambulatory Visit: Payer: Self-pay

## 2024-04-21 ENCOUNTER — Encounter: Payer: Self-pay | Admitting: "Endocrinology

## 2024-04-23 ENCOUNTER — Ambulatory Visit: Admitting: Neurology

## 2024-04-23 DIAGNOSIS — G43709 Chronic migraine without aura, not intractable, without status migrainosus: Secondary | ICD-10-CM | POA: Diagnosis not present

## 2024-04-23 NOTE — Progress Notes (Signed)

## 2024-05-07 NOTE — Progress Notes (Unsigned)
 NEUROLOGY FOLLOW UP OFFICE NOTE  Laurie Cherry [id number]  Assessment/Plan:   Migraine without aura, without status migrainosus, not intractable Elevated blood pressure reading    Migraine prevention:  Botox  every 3 months, propranolol  ER 60mg  daily.  Migraine rescue:  sumatriptan  50-100mg  Limit use of pain relievers to no more than 2 days out of week to prevent risk of rebound or medication-overuse headache. Keep headache diary Follow up with PCP regarding blood pressure Follow up in 6 months for routine visit.  Subjective:  Laurie Cherry is a 63 year old right-handed female with rheumatoid arthritis, hypothyroidism, reflux and mild depression who follows up for migraines.   UPDATE: Intensity:  mild to moderate Duration:  1 to 2 hours. Frequency:  when taking Botox  on schedule, no migraines.  She was off schedule recently due to insurance issues, she reports dull headache/periorbital pain daily over the past week.  She thinks she is having a sinus infection.   Current NSAIDS:  ibuprofen  Current analgesics:  Excedrin Migraine Current triptans:  Sumatriptan  50mg  (100mg  caused drowsiness) w/wo naproxen  500mg  Current ergotamine:  none Current anti-emetic:  none Current muscle relaxants:  none Current anti-anxiolytic:  none Current sleep aide:  none Current Antihypertensive medications:  Propranolol  ER 60mg  Current Antidepressant medications:  Celexa  Current Anticonvulsant medications:  none Current anti-CGRP:  none Current Vitamins/Herbal/Supplements:  folic acid Current Antihistamines/Decongestants:  Claritin Other therapy:  Botox  Hormone/birth control:  estradiol Other medications:  Evolocumab , methotrexate, Rinvoq   Caffeine:  1 cup of coffee daily Alcohol:  no Smoker:  no Diet:  1/2 can of soda daily.  Drinks water  Exercise:  no Depression:  stable; Anxiety:  no Other pain:  no Sleep hygiene:  varies   HISTORY: She has a history of hormonal migraines  most of her life, described as pounding right temporal pain and often associated with her menstrual cycle.  They have pretty much stabilized but she will still get it every now and then.     She was diagnosed with rheumatoid arthritis in 2014.  She had been on methotrexate for about 2 years (stopped a month ago).  In September 2015, she began having new headaches.  It coincided with change in her thyroid  medications.  They are left sided, 7/10 non-throbbing pain.    They are associated with photophobia, phonophobia, and osmophobia but not nausea.  Initially, they typically lasted 3 to 4 days and occur 1 to 2 times a week (however she also has a dull daily headache) Stress is a trigger.  Change in thyroid  medication may have been a trigger. These headaches do not correlate with her menstrual cycle.  Sumatriptan  with naproxen  helps relieve it.   Past NSAIDS:  no Past analgesics:  Excedrin, Tylenol  (ineffective) Past abortive triptans:  no Past muscle relaxants:  no Past anti-emetic:  no Past antihypertensive medications:  atenolol  (25mg  ineffective and higher doses caused dizziness) Past antidepressant medications:  venlafaxine  XR 150mg  Past anticonvulsant medications:  topiramate  25mg  (caused drowsiness, but willing to retry it if needed) Past anti-CGRP:  Aimovig  70mg  (she was on it for 2 months.  Ineffective) Past vitamins/Herbal/Supplements:  no Past antihistamines/decongestants:  no Other past therapies:  no   Family history of headache:  Mom, sister Mother had glioblastoma  PAST MEDICAL HISTORY: Past Medical History:  Diagnosis Date   Depression    Esophagitis    Family history of adverse reaction to anesthesia    Sister   Fibroids    GERD (gastroesophageal reflux disease)  Hypothyroidism    Rheumatoid arthritis (HCC)    Rheumatoid arthritis (HCC) 01/20/2017   Worsening headaches     MEDICATIONS: Current Outpatient Medications on File Prior to Visit  Medication Sig Dispense  Refill   botulinum toxin Type A  (BOTOX ) 200 units injection Inject 155 units IM into the multiple sites in the face neck and head once every 90 days 1 each 4   citalopram  (CELEXA ) 20 MG tablet Take 10 mg by mouth at bedtime.  3   cyanocobalamin (VITAMIN B12) 1000 MCG/ML injection Inject 1,000 mcg into the muscle once a week.     Evolocumab  (REPATHA ) 140 MG/ML SOSY INJECT 140MG  UNDER THE SKIN ONCE EVERY 2 WEEKS 4 mL 0   fludrocortisone  (FLORINEF ) 0.1 MG tablet Take 1 tablet (0.1 mg total) by mouth daily with breakfast. 90 tablet 3   folic acid (FOLVITE) 1 MG tablet Take 1 mg by mouth daily.      hydrocortisone  (CORTEF ) 5 MG tablet TAKE 2 TABLET BY MOUTH EVERY DAY AT 8AM AND THEN 1 TABLET EVERY DAY AT NOON 270 tablet 0   loratadine (CLARITIN) 10 MG tablet Take 10 mg by mouth at bedtime.     methotrexate (RHEUMATREX) 2.5 MG tablet Take 10 mg by mouth once a week. Caution:Chemotherapy. Protect from light.     montelukast  (SINGULAIR ) 10 MG tablet TAKE 1 TABLET(10 MG) BY MOUTH AT BEDTIME 90 tablet 3   propranolol  ER (INDERAL  LA) 60 MG 24 hr capsule TAKE 1 CAPSULE(60 MG) BY MOUTH DAILY 90 capsule 3   RABEprazole  (ACIPHEX ) 20 MG tablet TAKE 1 TABLET BY MOUTH EVERY MORNING 30 MINUTES BEFORE BREAKFAST 90 tablet 3   SUMAtriptan  (IMITREX ) 100 MG tablet Take 1 tablet (100 mg total) by mouth as needed for migraine. May repeat in 2 hours if headache persists or recurs. 10 tablet 5   TIROSINT  50 MCG CAPS TAKE 1 CAPSULE(50 MCG) BY MOUTH EVERY OTHER DAY BEFORE BREAKFAST. 50 capsule 1   TIROSINT  75 MCG CAPS TAKE 1 CAPSULE(75 MCG) BY MOUTH EVERY OTHER DAY 50 capsule 1   Current Facility-Administered Medications on File Prior to Visit  Medication Dose Route Frequency Provider Last Rate Last Admin   botulinum toxin Type A  (BOTOX ) injection 200 Units  200 Units Intramuscular Q90 days Skeet Juliene SAUNDERS, DO   155 Units at 11/28/22 1153    ALLERGIES: Allergies  Allergen Reactions   Arava [Leflunomide] Hives    Azithromycin Other (See Comments)    Severe bloating, abd pain   Cefuroxime Axetil Other (See Comments)    Increased bloating, abd pain   Clarithromycin Other (See Comments)    abd pain   Etanercept Hives and Other (See Comments)    Injection site irritation (ENBREL)   Levofloxacin Hives and Other (See Comments)    Severe dizziness   Sulfa Antibiotics Hives and Other (See Comments)    abd pain   Methotrexate Derivatives Other (See Comments)    Injection ONLY (irritation at the site of injection)    FAMILY HISTORY: Family History  Problem Relation Age of Onset   Thyroid  disease Mother    Heart attack Mother    Stroke Mother    Diabetes Mother    Brain cancer Mother    Colon cancer Father    Heart attack Sister    Hypertension Sister    Hypertension Brother       Objective:  *** General: No acute distress.  Patient appears well-groomed.   Head:  Normocephalic/atraumatic Neck:  Supple.  No paraspinal tenderness.  Full range of motion. Heart:  Regular rate and rhythm. Neuro:  Alert and oriented.  Speech fluent and not dysarthric.  Language intact.  CN II-XII intact.  Bulk and tone normal.  Muscle strength 5/5 throughout.  Sensation to light touch intact.  Deep tendon reflexes 2+ throughout, toes downgoing.  Gait normal.  Romberg negative.   Juliene Dunnings, DO  CC: Lawrnce Holmes

## 2024-05-10 ENCOUNTER — Encounter: Payer: Self-pay | Admitting: Neurology

## 2024-05-10 ENCOUNTER — Ambulatory Visit: Payer: 59 | Admitting: Neurology

## 2024-05-10 VITALS — BP 144/70 | HR 73 | Ht 62.0 in | Wt 131.0 lb

## 2024-05-10 DIAGNOSIS — G43009 Migraine without aura, not intractable, without status migrainosus: Secondary | ICD-10-CM | POA: Diagnosis not present

## 2024-05-10 NOTE — Patient Instructions (Signed)
 Botox  Propranolol  Sumatriptan 

## 2024-06-11 ENCOUNTER — Other Ambulatory Visit (INDEPENDENT_AMBULATORY_CARE_PROVIDER_SITE_OTHER): Payer: Self-pay | Admitting: Gastroenterology

## 2024-06-14 NOTE — Telephone Encounter (Signed)
 This is a medication used for allergies and asthma, I do not prescribe this medicine. If she needs this, she will need to discuss with PCP Thanks

## 2024-06-23 ENCOUNTER — Other Ambulatory Visit: Payer: Self-pay | Admitting: "Endocrinology

## 2024-07-01 ENCOUNTER — Other Ambulatory Visit: Payer: Self-pay | Admitting: "Endocrinology

## 2024-07-03 LAB — COMPREHENSIVE METABOLIC PANEL WITH GFR
ALT: 18 IU/L (ref 0–32)
AST: 23 IU/L (ref 0–40)
Albumin: 4.2 g/dL (ref 3.9–4.9)
Alkaline Phosphatase: 60 IU/L (ref 44–121)
BUN/Creatinine Ratio: 13 (ref 12–28)
BUN: 9 mg/dL (ref 8–27)
Bilirubin Total: 0.4 mg/dL (ref 0.0–1.2)
CO2: 26 mmol/L (ref 20–29)
Calcium: 9.4 mg/dL (ref 8.7–10.3)
Chloride: 101 mmol/L (ref 96–106)
Creatinine, Ser: 0.69 mg/dL (ref 0.57–1.00)
Globulin, Total: 2.3 g/dL (ref 1.5–4.5)
Glucose: 83 mg/dL (ref 70–99)
Potassium: 3.7 mmol/L (ref 3.5–5.2)
Sodium: 143 mmol/L (ref 134–144)
Total Protein: 6.5 g/dL (ref 6.0–8.5)
eGFR: 99 mL/min/1.73 (ref >59)

## 2024-07-03 LAB — LIPID PANEL
Chol/HDL Ratio: 2.6 ratio (ref 0.0–4.4)
Cholesterol, Total: 127 mg/dL (ref 100–199)
HDL: 49 mg/dL (ref >39)
LDL Chol Calc (NIH): 59 mg/dL (ref 0–99)
Triglycerides: 106 mg/dL (ref 0–149)
VLDL Cholesterol Cal: 19 mg/dL (ref 5–40)

## 2024-07-03 LAB — TSH: TSH: 1.44 u[IU]/mL (ref 0.450–4.500)

## 2024-07-03 LAB — T4, FREE: Free T4: 1.72 ng/dL (ref 0.82–1.77)

## 2024-07-07 ENCOUNTER — Encounter: Payer: Self-pay | Admitting: "Endocrinology

## 2024-07-07 ENCOUNTER — Ambulatory Visit: Admitting: "Endocrinology

## 2024-07-07 VITALS — BP 148/86 | HR 80 | Ht 62.0 in | Wt 133.4 lb

## 2024-07-07 DIAGNOSIS — E039 Hypothyroidism, unspecified: Secondary | ICD-10-CM

## 2024-07-07 DIAGNOSIS — E2740 Unspecified adrenocortical insufficiency: Secondary | ICD-10-CM

## 2024-07-07 DIAGNOSIS — E673 Hypervitaminosis D: Secondary | ICD-10-CM | POA: Diagnosis not present

## 2024-07-07 DIAGNOSIS — E782 Mixed hyperlipidemia: Secondary | ICD-10-CM

## 2024-07-07 NOTE — Progress Notes (Signed)
 07/07/2024  Endocrinology follow-up note   Subjective:    Patient ID: Laurie Cherry, female    DOB: January 14, 1961, PCP Viktoria Clunes E   Past Medical History:  Diagnosis Date   Depression    Esophagitis    Family history of adverse reaction to anesthesia    Sister   Fibroids    GERD (gastroesophageal reflux disease)    Hypothyroidism    Rheumatoid arthritis (HCC)    Rheumatoid arthritis (HCC) 01/20/2017   Worsening headaches    Past Surgical History:  Procedure Laterality Date   ABDOMINAL HYSTERECTOMY N/A 04/03/2017   Procedure: TOTAL HYSTERECTOMY ABDOMINAL;  Surgeon: Marget Lenis, MD;  Location: [facility];  Service: Gynecology;  Laterality: N/A;   BIOPSY  04/10/2018   Procedure: BIOPSY;  Surgeon: Golda Claudis PENNER, MD;  [facility]: [facility];  Service: Endoscopy;;  esophegus    BIOPSY  10/19/2018   Procedure: BIOPSY;  Surgeon: Golda Claudis PENNER, MD;  [facility]: [facility];  Service: Endoscopy;;  esophageal    BIOPSY  04/12/2020   Procedure: BIOPSY;  Surgeon: Golda Claudis PENNER, MD;  [facility];  Service: Endoscopy;;   COLONOSCOPY     COLONOSCOPY     COLONOSCOPY N/A 04/10/2018   Procedure: COLONOSCOPY;  Surgeon: Golda Claudis PENNER, MD;  [facility];  Service: Endoscopy;  Laterality: N/A;   ESOPHAGEAL DILATION N/A 04/10/2018   Procedure: ESOPHAGEAL DILATION;  Surgeon: Golda Claudis PENNER, MD;  [facility];  Service: Endoscopy;  Laterality: N/A;  balloon dilation   ESOPHAGEAL DILATION N/A 10/19/2018   Procedure: ESOPHAGEAL DILATION;  Surgeon: Golda Claudis PENNER, MD;  [facility];  Service: Endoscopy;  Laterality: N/A;   ESOPHAGEAL DILATION N/A 04/12/2020   Procedure: ESOPHAGEAL DILATION;  Surgeon: Golda Claudis PENNER, MD;  [facility];  Service: Endoscopy;  Laterality: N/A;   ESOPHAGOGASTRODUODENOSCOPY     ESOPHAGOGASTRODUODENOSCOPY N/A 04/10/2018   Procedure: ESOPHAGOGASTRODUODENOSCOPY (EGD);  Surgeon: Golda Claudis PENNER, MD;   [facility];  Service: Endoscopy;  Laterality: N/A;  1:25   ESOPHAGOGASTRODUODENOSCOPY N/A 10/19/2018   Procedure: ESOPHAGOGASTRODUODENOSCOPY (EGD);  Surgeon: Golda Claudis PENNER, MD;  [facility];  Service: Endoscopy;  Laterality: N/A;  7:30   ESOPHAGOGASTRODUODENOSCOPY N/A 04/12/2020   Procedure: ESOPHAGOGASTRODUODENOSCOPY (EGD);  Surgeon: Golda Claudis PENNER, MD;  [facility];  Service: Endoscopy;  Laterality: N/A;  235   EYE SURGERY Right    SALPINGOOPHORECTOMY Bilateral 04/03/2017   Procedure: SALPINGO OOPHORECTOMY;  Surgeon: Marget Lenis, MD;  Location: [facility];  Service: Gynecology;  Laterality: Bilateral;   Social History   Socioeconomic History   Marital status: Married    Spouse name: Not on file   Number of children: 1   Years of education: Not on file   Highest education level: Master's degree (e.g., MA, MS, MEng, MEd, MSW, MBA)  Occupational History   Not on file  Tobacco Use   Smoking status: Never   Smokeless tobacco: Never  Vaping Use   Vaping status: Never Used  Substance and Sexual Activity   Alcohol use: No    Alcohol/week: 0.0 standard drinks of alcohol   Drug use: No   Sexual activity: Not on file  Other Topics Concern   Not on file  Social History Narrative   Pt is married lives with spouse in 2 story home she has 1 children, Master Degree in Education   She is right handed, and drinks coffee once a  day, 1 soda a day and not tea   She does not work out at all   Social Drivers of [employer] Strain: Not on file  Food Insecurity: Not on file  Transportation Needs: Not on file  Physical Activity: Not on file  Stress: Not on file  Social Connections: Not on file   Outpatient Encounter Medications as of 07/07/2024  Medication Sig   botulinum toxin Type A  (BOTOX ) 200 units injection Inject 155 units IM into the multiple sites in the face neck and head once every 90 days   citalopram  (CELEXA ) 20 MG tablet Take 10  mg by mouth at bedtime.   cyanocobalamin (VITAMIN B12) 1000 MCG/ML injection Inject 1,000 mcg into the muscle once a week.   Evolocumab  (REPATHA ) 140 MG/ML SOSY INJECT 140MG  UNDER THE SKIN ONCE EVERY 2 WEEKS   folic acid (FOLVITE) 1 MG tablet Take 1 mg by mouth daily.    hydrocortisone  (CORTEF ) 5 MG tablet TAKE 2 TABLET BY MOUTH EVERY DAY AT 8AM AND THEN 1 TABLET EVERY DAY AT NOON   loratadine (CLARITIN) 10 MG tablet Take 10 mg by mouth at bedtime.   methotrexate (RHEUMATREX) 2.5 MG tablet Take 10 mg by mouth once a week. Caution:Chemotherapy. Protect from light.   montelukast  (SINGULAIR ) 10 MG tablet TAKE 1 TABLET(10 MG) BY MOUTH AT BEDTIME   propranolol  ER (INDERAL  LA) 60 MG 24 hr capsule TAKE 1 CAPSULE(60 MG) BY MOUTH DAILY   RABEprazole  (ACIPHEX ) 20 MG tablet TAKE 1 TABLET BY MOUTH EVERY MORNING 30 MINUTES BEFORE BREAKFAST   SUMAtriptan  (IMITREX ) 100 MG tablet Take 1 tablet (100 mg total) by mouth as needed for migraine. May repeat in 2 hours if headache persists or recurs.   TIROSINT  50 MCG CAPS TAKE 1 CAPSULE(50 MCG) BY MOUTH EVERY OTHER DAY BEFORE BREAKFAST.   TIROSINT  75 MCG CAPS TAKE 1 CAPSULE(75 MCG) BY MOUTH EVERY OTHER DAY   [DISCONTINUED] fludrocortisone  (FLORINEF ) 0.1 MG tablet Take 1 tablet (0.1 mg total) by mouth daily with breakfast.   Facility-Administered Encounter Medications as of 07/07/2024  Medication   botulinum toxin Type A  (BOTOX ) injection 200 Units   ALLERGIES: Allergies  Allergen Reactions   Arava [Leflunomide] Hives   Azithromycin Other (See Comments)    Severe bloating, abd pain   Cefuroxime Axetil Other (See Comments)    Increased bloating, abd pain   Clarithromycin Other (See Comments)    abd pain   Etanercept Hives and Other (See Comments)    Injection site irritation (ENBREL)   Levofloxacin Hives and Other (See Comments)    Severe dizziness   Sulfa Antibiotics Hives and Other (See Comments)    abd pain   Methotrexate Derivatives Other (See  Comments)    Injection ONLY (irritation at the site of injection)   VACCINATION STATUS:  There is no immunization history on file for this patient.  HPI  63 year old female patient with medical history as above.  She is being seen in follow-up for hypothyroidism, adrenal insufficiency, hyperlipidemia.   She remains on her stable medication regimens with consistency and compliance.  And recent weight, she has registered above target blood pressure readings.  She has required intra-articular steroid injection on her shoulders in the interval.  Patient has significant exposure to steroids over the years due to her autoimmune conditions.  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  She states that she was diagnosed with hypothyroidism approximately  at age 47.  She has no new complaints.  Her previsit thyroid  function tests are consistent with appropriate replacement with Tirosint  50 and 75 mcg on alternate days.    She is responding to Repatha  for hyperlipidemia with significant improvement in her lipid panel-see below.    She has family  history of thyroid  dysfunction in multiple members of her family.  She denies any history of goiter. She denies any family history of thyroid  cancer. - She underwent total abdominal hysterectomy/ salphigo oophorectomy due to a large ovarian cyst. Findings were reportedly benign.  -She is on multiple medications for her rheumatoid arthritis.  Intra-articular steroids triggered some anxiety and chest discomfort which resolved within the following week.  She did have normal EKG at her PCP.    Review of Systems Limited as above.  Objective:    BP (!) 148/86 Comment: R arm with manual cuff. Dr.Isiac Breighner made aware.  Pulse 80   Ht 5' 2 (1.575 m)   Wt 133 lb 6.4 oz (60.5 kg)   LMP 03/31/2017   BMI 24.40 kg/m   Wt Readings from Last 3 Encounters:  07/07/24 133 lb 6.4 oz (60.5 kg)   05/10/24 131 lb (59.4 kg)  02/26/24 133 lb 14.4 oz (60.7 kg)    Physical Exam    Recent Results (from the past 2160 hours)  Comprehensive metabolic panel     Status: None   Collection Time: 07/02/24  1:13 PM  Result Value Ref Range   Glucose 83 70 - 99 mg/dL   BUN 9 8 - 27 mg/dL   Creatinine, Ser 9.30 0.57 - 1.00 mg/dL   eGFR 99 >40 fO/fpw/8.26   BUN/Creatinine Ratio 13 12 - 28   Sodium 143 134 - 144 mmol/L   Potassium 3.7 3.5 - 5.2 mmol/L   Chloride 101 96 - 106 mmol/L   CO2 26 20 - 29 mmol/L   Calcium  9.4 8.7 - 10.3 mg/dL   Total Protein 6.5 6.0 - 8.5 g/dL   Albumin 4.2 3.9 - 4.9 g/dL   Globulin, Total 2.3 1.5 - 4.5 g/dL   Bilirubin Total 0.4 0.0 - 1.2 mg/dL   Alkaline Phosphatase 60 44 - 121 IU/L    Comment: **Effective July 12, 2024 Alkaline Phosphatase**   reference interval will be changing to:              Age                Female          Female           0 -  5 days         47 - 127       47 - 127           6 - 10 days         29 - 242       29 - 242          11 - 20 days        109 - 357      109 - 357          21 - 30 days         94 - 494       94 - 494           1 -  2 months      149 - 539      149 - 539  3 -  6 months      131 - 452      131 - 452           7 - 11 months      117 - 401      117 - 401   12 months -  6 years       158 - 369      158 - 369           7 - 12 years       150 - 409      150 - 409               13 years       156 - 435       78 - 227               14 years       114 - 375       64 - 161               15 years        88 - 279       56 - 134               16 years        74 - 207       51 - 121               17 years        63 - 161       47 - 113          18 - 20 years        51 - 125       42 - 106          21 - 50 years         47 - 123       41 - 116          51 - 80 years        49 - 135       51 - 125              >80 years        48 - 129       48 - 129    AST 23 0 - 40 IU/L   ALT 18 0 - 32 IU/L  TSH      Status: None   Collection Time: 07/02/24  1:13 PM  Result Value Ref Range   TSH 1.440 0.450 - 4.500 uIU/mL  T4, free     Status: None   Collection Time: 07/02/24  1:13 PM  Result Value Ref Range   Free T4 1.72 0.82 - 1.77 ng/dL  Lipid panel     Status: None   Collection Time: 07/02/24  1:13 PM  Result Value Ref Range   Cholesterol, Total 127 100 - 199 mg/dL   Triglycerides 893 0 - 149 mg/dL   HDL 49 >60 mg/dL   VLDL Cholesterol Cal 19 5 - 40 mg/dL   LDL Chol Calc (NIH) 59 0 - 99 mg/dL   Chol/HDL Ratio 2.6 0.0 - 4.4 ratio    Comment:  T. Chol/HDL Ratio                                             Men  Women                               1/2 Avg.Risk  3.4    3.3                                   Avg.Risk  5.0    4.4                                2X Avg.Risk  9.6    7.1                                3X Avg.Risk 23.4   11.0     - On 08/16/2016 her free T4 was 1.36, improving from prior studies. - On 05/09/2016: Free T4 0.63, TSH 7.58, TPO antibodies 13  Assessment & Plan:   Adrenal insufficiency  2.  Hypothyroidism  3.  Dyslipidemia  4.  Vitamin D  deficiency I reviewed her recent labs assessing adrenal insufficiency.  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  Unlikely to be secondary to Addison's disease- now that she has negative antibodies for 21-hydroxylase .  -She is advised to continue hydrocortisone  10 mg p.o. daily at 8 AM and 5 mg p.o. daily at noon.    Sick day rules discussed with her.   Due to her recent blood pressure readings being above target and her office blood pressure at 148/86, she is advised to discontinue her fludrocortisone  for now.  She wears a medical alert depicting adrenal insufficiency.     -Her Tirosint  was recently adjusted -  Advised her to take Tirosint  50 mcg on alternate days while taking 75 mcg on the other alternate days.     - We discussed about the  correct intake of her thyroid  hormone, on empty stomach at fasting, with water , separated by at least 30 minutes from breakfast and other medications,  and separated by more than 4 hours from calcium , iron, multivitamins, acid reflux medications (PPIs). -Patient is made aware of the fact that thyroid  hormone replacement is needed for life, dose to be adjusted by periodic monitoring of thyroid  function tests.   -She has no clinical goiter, or thyroid  ultrasound from July 2019 was unremarkable except for shrunk bilateral thyroid  lobes.     For  Hyperlipidemia She has responded to her Repatha  injections.  Her previsit labs show LDL at 59, overall improving from 177.    She will continue to benefit from this intervention.  She is advised to continue Repatha  140 mg subcutaneously every 2 weeks. Her point-of-care A1c is 5.5%, indicating absence of prediabetes/diabetes. Her next labs will include lipoprotein (a).  She is  advised to go to ER if  or when she develops chest discomfort.  - I advised patient to maintain close follow up with Elliott, Dianne E for primary care needs.   I spent  25  minutes in the  care of the patient today including review of labs from Thyroid  Function, CMP, and other relevant labs ; imaging/biopsy records (current and previous including abstractions from other facilities); face-to-face time discussing  her lab results and symptoms, medications doses, her options of short and long term treatment based on the latest standards of care / guidelines;   and documenting the encounter.  Mliss JAYSON Rosa  participated in the discussions, expressed understanding, and voiced agreement with the above plans.  All questions were answered to her satisfaction. she is encouraged to contact clinic should she have any questions or concerns prior to her return visit.    Follow up plan: Return in about 6 months (around 01/04/2025) for Fasting Labs  in AM B4 8.  Ranny Earl, MD Phone:  [phone]  Fax: [phone]   07/07/2024, 4:23 PM

## 2024-07-12 ENCOUNTER — Other Ambulatory Visit: Payer: Self-pay

## 2024-07-12 NOTE — Progress Notes (Signed)
 Specialty Pharmacy Refill Coordination Note  Laurie Cherry is a 63 y.o. female contacted today regarding refills of specialty medication(s) OnabotulinumtoxinA  (Botox )   Patient requested Courier to Provider Office   Delivery date: 07/19/24   Verified address: [address]   [place] [address]   Medication will be filled on 09.19.25.   Copay: $0.00 Appointment 9.26.25

## 2024-07-15 ENCOUNTER — Other Ambulatory Visit: Payer: Self-pay

## 2024-07-19 ENCOUNTER — Other Ambulatory Visit: Payer: Self-pay

## 2024-07-23 ENCOUNTER — Ambulatory Visit: Admitting: Neurology

## 2024-07-23 DIAGNOSIS — G43709 Chronic migraine without aura, not intractable, without status migrainosus: Secondary | ICD-10-CM

## 2024-07-23 MED ORDER — ONABOTULINUMTOXINA 100 UNITS IJ SOLR
200.0000 [IU] | Freq: Once | INTRAMUSCULAR | Status: AC
  Administered 2024-07-23: 155 [IU] via INTRAMUSCULAR

## 2024-07-23 NOTE — Progress Notes (Signed)

## 2024-08-11 ENCOUNTER — Encounter (INDEPENDENT_AMBULATORY_CARE_PROVIDER_SITE_OTHER): Payer: Self-pay | Admitting: Gastroenterology

## 2024-09-06 ENCOUNTER — Other Ambulatory Visit: Payer: Self-pay | Admitting: "Endocrinology

## 2024-09-16 ENCOUNTER — Telehealth: Payer: Self-pay | Admitting: Pharmacy Technician

## 2024-09-16 NOTE — Telephone Encounter (Signed)
 Pharmacy Patient Advocate Encounter   Received notification from Pt Calls Messages that prior authorization for BOTOX  200 is required/requested.   Insurance verification completed.   The patient is insured through [facility].   Per test claim: PA required; PA submitted to above mentioned insurance via Fax Key/confirmation #/EOC [id number] Status is pending  Benefit Verification [id number] Submitted

## 2024-09-17 ENCOUNTER — Other Ambulatory Visit (INDEPENDENT_AMBULATORY_CARE_PROVIDER_SITE_OTHER): Payer: Self-pay | Admitting: Gastroenterology

## 2024-09-20 ENCOUNTER — Encounter (INDEPENDENT_AMBULATORY_CARE_PROVIDER_SITE_OTHER): Payer: Self-pay

## 2024-09-20 NOTE — Telephone Encounter (Signed)
 This is a medication that is prescribed for non-GI purposes. She should reach her PCP for further refills, as there is no indication from my side to fill this medicine Thanks

## 2024-09-20 NOTE — Telephone Encounter (Signed)
 A My Chart message was sent to the patient stating: Per Dr. Eartha, the montelukast  (SINGULAIR ) 10 MG tablet medication that is prescribed for non-GI purposes. She should reach her PCP for further refills, as there is no indication from my side to fill this medicine.    Thanks,

## 2024-09-30 ENCOUNTER — Telehealth: Payer: Self-pay | Admitting: "Endocrinology

## 2024-09-30 ENCOUNTER — Other Ambulatory Visit (HOSPITAL_COMMUNITY): Payer: Self-pay

## 2024-09-30 ENCOUNTER — Telehealth: Payer: Self-pay

## 2024-09-30 NOTE — Telephone Encounter (Signed)
 Pharmacy Patient Advocate Encounter   Received notification from Pt Calls Messages that prior authorization for Tirosint  75mcg is required/requested.   Insurance verification completed.   The patient is insured through [facility].   Per test claim: PA required and submitted KEY/EOC/Request #: [id number] from 09/30/2024 to 09/30/2025   30 day copay is $25

## 2024-09-30 NOTE — Telephone Encounter (Signed)
 Pharmacy Patient Advocate Encounter   Received notification from Pt Calls Messages that prior authorization for Tirosint  capsules  is required/requested.   Insurance verification completed.   The patient is insured through [facility].   Per test claim: PA required; PA submitted to above mentioned insurance via Latent Key/confirmation #/EOC [id number] Status is pending

## 2024-09-30 NOTE — Telephone Encounter (Signed)
 Patient states she is still waiting on a PA for her for Tirosint  50 an . She is out of the medication. Today is the first today she is completely out. Please Advise.

## 2024-09-30 NOTE — Telephone Encounter (Signed)
 Pharmacy Patient Advocate Encounter  Received notification from [facility] that Prior Authorization for Tirosint  50mcg caps has been APPROVED from 09/30/2024 to 09/30/2025. Ran test claim, Copay is $25. This test claim was processed through [facility]- copay amounts may vary at other pharmacies due to pharmacy/plan contracts, or as the patient moves through the different stages of their insurance plan.

## 2024-10-01 NOTE — Telephone Encounter (Signed)
 Pt made aware.

## 2024-10-07 ENCOUNTER — Other Ambulatory Visit (HOSPITAL_COMMUNITY): Payer: Self-pay

## 2024-10-19 ENCOUNTER — Other Ambulatory Visit (HOSPITAL_COMMUNITY): Payer: Self-pay

## 2024-10-19 NOTE — Telephone Encounter (Signed)
 Pharmacy Patient Advocate Encounter- Injection via Pharmacy Benefit:  PA was submitted  for Botox - J0585 to [facility] and has been approved through: 11.20.25 to 11.20.26 Authorization# [id number]  Please send prescription to Specialty Pharmacy: [facility]: [phone]  Estimated Pharmacy Copay is: $0  Patient is eligible for Botox - J0585 Copay Card, which will make patient's copay as little as zero. Copay card will be provided to pharmacy.

## 2024-10-20 ENCOUNTER — Other Ambulatory Visit: Payer: Self-pay

## 2024-10-20 ENCOUNTER — Other Ambulatory Visit: Payer: Self-pay | Admitting: Neurology

## 2024-10-20 MED ORDER — BOTOX 200 UNITS IJ SOLR
INTRAMUSCULAR | 4 refills | Status: AC
  Filled 2024-10-20: qty 1, fill #0
  Filled 2024-10-25: qty 1, 90d supply, fill #0

## 2024-10-22 ENCOUNTER — Ambulatory Visit: Admitting: Neurology

## 2024-10-25 ENCOUNTER — Other Ambulatory Visit: Payer: Self-pay

## 2024-10-25 ENCOUNTER — Other Ambulatory Visit (HOSPITAL_COMMUNITY): Payer: Self-pay

## 2024-10-25 NOTE — Progress Notes (Signed)
 Specialty Pharmacy Refill Coordination Note  Laurie Cherry is a 63 y.o. female assessed today regarding refills of clinic administered specialty medication(s) OnabotulinumtoxinA  (Botox )   Clinic requested Courier to Provider Office   Delivery date: 11/01/24   Verified address: [address]   [place] [address]   Medication will be filled on: 10/29/24   Appointment 11/05/24.

## 2024-10-28 ENCOUNTER — Other Ambulatory Visit: Payer: Self-pay | Admitting: Neurology

## 2024-10-28 DIAGNOSIS — G43009 Migraine without aura, not intractable, without status migrainosus: Secondary | ICD-10-CM

## 2024-11-03 ENCOUNTER — Other Ambulatory Visit: Payer: Self-pay | Admitting: "Endocrinology

## 2024-11-05 ENCOUNTER — Ambulatory Visit: Admitting: Neurology

## 2024-11-05 DIAGNOSIS — G43009 Migraine without aura, not intractable, without status migrainosus: Secondary | ICD-10-CM | POA: Diagnosis not present

## 2024-11-05 MED ORDER — ONABOTULINUMTOXINA 100 UNITS IJ SOLR
200.0000 [IU] | Freq: Once | INTRAMUSCULAR | Status: AC
  Administered 2024-11-05: 155 [IU] via INTRAMUSCULAR

## 2024-11-05 NOTE — Progress Notes (Unsigned)

## 2024-11-10 ENCOUNTER — Other Ambulatory Visit: Payer: Self-pay | Admitting: "Endocrinology

## 2024-11-15 ENCOUNTER — Other Ambulatory Visit: Payer: Self-pay | Admitting: Neurology

## 2024-11-15 DIAGNOSIS — G43009 Migraine without aura, not intractable, without status migrainosus: Secondary | ICD-10-CM

## 2024-11-17 ENCOUNTER — Telehealth: Payer: Self-pay | Admitting: "Endocrinology

## 2024-11-17 NOTE — Telephone Encounter (Signed)
 Patient is checking status of her Repatha  - she is OUT and is due for it by end of week.

## 2024-11-18 ENCOUNTER — Other Ambulatory Visit (HOSPITAL_COMMUNITY): Payer: Self-pay

## 2024-11-18 ENCOUNTER — Telehealth: Payer: Self-pay

## 2024-11-18 NOTE — Telephone Encounter (Signed)
 Pharmacy Patient Advocate Encounter   Received notification from Pt Calls Messages that prior authorization for Repatha  140MG /ML syringes is required/requested.   Insurance verification completed.   The patient is insured through [facility].   Per test claim: PA required; PA submitted to above mentioned insurance via Latent Key/confirmation #/EOC [id number] Status is pending

## 2025-01-05 ENCOUNTER — Ambulatory Visit: Admitting: "Endocrinology

## 2025-02-04 ENCOUNTER — Ambulatory Visit: Payer: Self-pay | Admitting: Neurology

## 2025-02-04 ENCOUNTER — Ambulatory Visit: Admitting: Neurology

## 2025-05-10 ENCOUNTER — Ambulatory Visit: Admitting: Neurology
